# Patient Record
Sex: Female | Born: 1935 | Race: White | Hispanic: No | Smoking: Never smoker
Health system: Southern US, Community
[De-identification: ages and names within clinical notes are randomized; demographics above are authoritative.]

## PROBLEM LIST (undated history)

## (undated) DIAGNOSIS — N904 Leukoplakia of vulva: Secondary | ICD-10-CM

## (undated) DIAGNOSIS — C9590 Leukemia, unspecified not having achieved remission: Secondary | ICD-10-CM

## (undated) DIAGNOSIS — K219 Gastro-esophageal reflux disease without esophagitis: Secondary | ICD-10-CM

## (undated) DIAGNOSIS — E785 Hyperlipidemia, unspecified: Secondary | ICD-10-CM

## (undated) DIAGNOSIS — Z9289 Personal history of other medical treatment: Secondary | ICD-10-CM

## (undated) DIAGNOSIS — I629 Nontraumatic intracranial hemorrhage, unspecified: Secondary | ICD-10-CM

## (undated) DIAGNOSIS — I10 Essential (primary) hypertension: Secondary | ICD-10-CM

## (undated) DIAGNOSIS — E079 Disorder of thyroid, unspecified: Secondary | ICD-10-CM

## (undated) DIAGNOSIS — K279 Peptic ulcer, site unspecified, unspecified as acute or chronic, without hemorrhage or perforation: Secondary | ICD-10-CM

## (undated) DIAGNOSIS — Z950 Presence of cardiac pacemaker: Secondary | ICD-10-CM

## (undated) HISTORY — DX: Leukoplakia of vulva: N90.4

## (undated) HISTORY — PX: TONSILLECTOMY AND ADENOIDECTOMY: SUR1326

## (undated) HISTORY — DX: Essential (primary) hypertension: I10

## (undated) HISTORY — DX: Presence of cardiac pacemaker: Z95.0

## (undated) HISTORY — DX: Gastro-esophageal reflux disease without esophagitis: K21.9

## (undated) HISTORY — DX: Hyperlipidemia, unspecified: E78.5

## (undated) HISTORY — DX: Peptic ulcer, site unspecified, unspecified as acute or chronic, without hemorrhage or perforation: K27.9

## (undated) HISTORY — DX: Disorder of thyroid, unspecified: E07.9

## (undated) HISTORY — PX: DILATION AND CURETTAGE OF UTERUS: SHX78

## (undated) HISTORY — PX: COLONOSCOPY: SHX174

## (undated) HISTORY — DX: Nontraumatic intracranial hemorrhage, unspecified: I62.9

## (undated) HISTORY — DX: Personal history of other medical treatment: Z92.89

## (undated) HISTORY — PX: CATARACT EXTRACTION W/ INTRAOCULAR LENS  IMPLANT, BILATERAL: SHX1307

---

## 1998-09-08 ENCOUNTER — Other Ambulatory Visit: Admission: RE | Admit: 1998-09-08 | Discharge: 1998-09-08 | Payer: Self-pay | Admitting: Internal Medicine

## 1999-09-20 ENCOUNTER — Encounter: Admission: RE | Admit: 1999-09-20 | Discharge: 1999-09-20 | Payer: Self-pay | Admitting: Internal Medicine

## 1999-09-20 ENCOUNTER — Encounter: Payer: Self-pay | Admitting: Internal Medicine

## 2000-01-05 ENCOUNTER — Other Ambulatory Visit: Admission: RE | Admit: 2000-01-05 | Discharge: 2000-01-05 | Payer: Self-pay | Admitting: Internal Medicine

## 2000-02-08 ENCOUNTER — Ambulatory Visit (HOSPITAL_COMMUNITY): Admission: RE | Admit: 2000-02-08 | Discharge: 2000-02-08 | Payer: Self-pay | Admitting: *Deleted

## 2000-02-08 ENCOUNTER — Encounter (INDEPENDENT_AMBULATORY_CARE_PROVIDER_SITE_OTHER): Payer: Self-pay | Admitting: Specialist

## 2000-12-06 ENCOUNTER — Encounter: Admission: RE | Admit: 2000-12-06 | Discharge: 2000-12-06 | Payer: Self-pay | Admitting: Internal Medicine

## 2000-12-06 ENCOUNTER — Encounter: Payer: Self-pay | Admitting: Internal Medicine

## 2001-01-10 ENCOUNTER — Other Ambulatory Visit: Admission: RE | Admit: 2001-01-10 | Discharge: 2001-01-10 | Payer: Self-pay | Admitting: Internal Medicine

## 2002-01-22 ENCOUNTER — Encounter: Admission: RE | Admit: 2002-01-22 | Discharge: 2002-01-22 | Payer: Self-pay | Admitting: Internal Medicine

## 2002-01-22 ENCOUNTER — Encounter: Payer: Self-pay | Admitting: Internal Medicine

## 2002-04-27 ENCOUNTER — Other Ambulatory Visit: Admission: RE | Admit: 2002-04-27 | Discharge: 2002-04-27 | Payer: Self-pay | Admitting: Internal Medicine

## 2003-02-05 ENCOUNTER — Encounter: Admission: RE | Admit: 2003-02-05 | Discharge: 2003-02-05 | Payer: Self-pay | Admitting: Internal Medicine

## 2003-02-05 ENCOUNTER — Encounter: Payer: Self-pay | Admitting: Internal Medicine

## 2003-07-02 ENCOUNTER — Inpatient Hospital Stay (HOSPITAL_COMMUNITY): Admission: EM | Admit: 2003-07-02 | Discharge: 2003-07-14 | Payer: Self-pay | Admitting: Emergency Medicine

## 2003-07-03 ENCOUNTER — Encounter (INDEPENDENT_AMBULATORY_CARE_PROVIDER_SITE_OTHER): Payer: Self-pay | Admitting: Specialist

## 2003-07-14 ENCOUNTER — Inpatient Hospital Stay (HOSPITAL_COMMUNITY)
Admission: RE | Admit: 2003-07-14 | Discharge: 2003-07-28 | Payer: Self-pay | Admitting: Physical Medicine & Rehabilitation

## 2003-07-24 HISTORY — PX: SUBDURAL HEMATOMA EVACUATION VIA CRANIOTOMY: SUR319

## 2003-08-18 ENCOUNTER — Encounter: Admission: RE | Admit: 2003-08-18 | Discharge: 2003-08-18 | Payer: Self-pay | Admitting: Geriatric Medicine

## 2003-09-03 ENCOUNTER — Encounter
Admission: RE | Admit: 2003-09-03 | Discharge: 2003-12-02 | Payer: Self-pay | Admitting: Physical Medicine & Rehabilitation

## 2004-03-01 ENCOUNTER — Encounter: Admission: RE | Admit: 2004-03-01 | Discharge: 2004-03-01 | Payer: Self-pay | Admitting: Internal Medicine

## 2004-07-20 ENCOUNTER — Inpatient Hospital Stay (HOSPITAL_COMMUNITY): Admission: EM | Admit: 2004-07-20 | Discharge: 2004-07-22 | Payer: Self-pay | Admitting: Emergency Medicine

## 2004-07-21 HISTORY — PX: PACEMAKER INSERTION: SHX728

## 2005-03-09 ENCOUNTER — Encounter: Admission: RE | Admit: 2005-03-09 | Discharge: 2005-03-09 | Payer: Self-pay | Admitting: Internal Medicine

## 2005-04-17 ENCOUNTER — Ambulatory Visit (HOSPITAL_COMMUNITY): Admission: RE | Admit: 2005-04-17 | Discharge: 2005-04-17 | Payer: Self-pay | Admitting: *Deleted

## 2005-04-17 ENCOUNTER — Encounter (INDEPENDENT_AMBULATORY_CARE_PROVIDER_SITE_OTHER): Payer: Self-pay | Admitting: *Deleted

## 2005-08-13 ENCOUNTER — Ambulatory Visit (HOSPITAL_COMMUNITY): Admission: RE | Admit: 2005-08-13 | Discharge: 2005-08-13 | Payer: Self-pay | Admitting: *Deleted

## 2006-03-19 ENCOUNTER — Encounter: Admission: RE | Admit: 2006-03-19 | Discharge: 2006-03-19 | Payer: Self-pay | Admitting: Internal Medicine

## 2007-03-27 ENCOUNTER — Encounter: Admission: RE | Admit: 2007-03-27 | Discharge: 2007-03-27 | Payer: Self-pay | Admitting: Internal Medicine

## 2008-03-31 ENCOUNTER — Encounter: Admission: RE | Admit: 2008-03-31 | Discharge: 2008-03-31 | Payer: Self-pay | Admitting: Internal Medicine

## 2009-04-01 ENCOUNTER — Encounter: Admission: RE | Admit: 2009-04-01 | Discharge: 2009-04-01 | Payer: Self-pay | Admitting: Internal Medicine

## 2009-10-21 DIAGNOSIS — Z9289 Personal history of other medical treatment: Secondary | ICD-10-CM

## 2009-10-21 HISTORY — DX: Personal history of other medical treatment: Z92.89

## 2009-10-21 HISTORY — PX: TRANSTHORACIC ECHOCARDIOGRAM: SHX275

## 2010-04-18 ENCOUNTER — Encounter: Admission: RE | Admit: 2010-04-18 | Discharge: 2010-04-18 | Payer: Self-pay | Admitting: Internal Medicine

## 2010-12-07 ENCOUNTER — Ambulatory Visit (AMBULATORY_SURGERY_CENTER): Payer: Medicare Other | Admitting: *Deleted

## 2010-12-07 ENCOUNTER — Encounter: Payer: Self-pay | Admitting: Gastroenterology

## 2010-12-07 VITALS — Ht 65.0 in | Wt 153.0 lb

## 2010-12-07 DIAGNOSIS — Z800 Family history of malignant neoplasm of digestive organs: Secondary | ICD-10-CM

## 2010-12-07 MED ORDER — PEG-KCL-NACL-NASULF-NA ASC-C 100 G PO SOLR: ORAL | Status: DC

## 2010-12-08 NOTE — Procedures (Signed)
[place]. [facility]  Patient:    [name], [name]                       MRN: [id number] Adm. Date:  [id number] Disc. Date: [id number] Attending:  Sabino Gasser                           Procedure Report  OPERATIVE PROCEDURE:  Upper endoscopy with biopsy.  INDICATIONS:  Heme positive stools.  Iron deficiency anemia.  ANESTHESIA:  Demerol 75 mg, Versed 7 mg given intravenously in divided dose.  PROCEDURE IN DETAIL:  With the patient mildly sedated in the left lateral decubitus position, the Olympus videoscopic endoscope was inserted into the mouth and passed under direct visualization through the esophagus.  The distal esophagus was approached and showed areas questionable of short segment Barretts.  This was photographed.  We entered into the stomach.  The  fundus, body, and antrum were well visualized and appeared normal although the tissue appeared somewhat adenomatous.  We advanced through the pylorus into the duodenal bulb and second portion of the duodenum- both of which appeared normal.  I photographed from this point.  The endoscope was slowly withdrawn to visualize the entire duodenal mucosa until the endoscope was pulled back in the stomach and placed in retroflexion to view the stomach from below.  This too appeared normal and was photographed.  Biopsies of the fundus were taken. The endoscope was then straightened and pulled back from distal to proximal for views of the entire gastric and remaining esophageal mucosa which otherwise appeared normal.  The patients vital signs and pulse oximeter remained stable.  The patient tolerated the procedure well without apparent complications.  FINDINGS:  Question of adenomatous type changes to the fundus of the stomach, biopsied, along with question of short segment Barretts esophagus distally, biopsied.  Await biopsy report.  The patient will call me for results and followup with me as an  outpatient.  Proceed to colonoscopy. DD:  02/08/00 TD:  02/09/00 Job: [id number]

## 2010-12-08 NOTE — Op Note (Signed)
NAMELEASHA, GOLDBERGER                ACCOUNT NO.:  [id number]   MEDICAL RECORD NO.:  [id number]          PATIENT TYPE:  AMB   LOCATION:  ENDO                         FACILITY:  [facility]   PHYSICIAN:  Georgiana Spinner, M.D.    DATE OF BIRTH:  03/03/1929   DATE OF PROCEDURE:  08/13/2005  DATE OF DISCHARGE:                                 OPERATIVE REPORT   PROCEDURE:  Upper endoscopy.   INDICATIONS:  Bleeding ulcers.   ANESTHESIA:  Demerol 40, Versed 4 mg.   DESCRIPTION OF PROCEDURE:  With the patient mildly sedated in the left  lateral decubitus position, the Olympus videoscopic endoscope was inserted  in the mouth, passed under direct vision through the esophagus which  appeared normal into the stomach. The fundus, body, antrum, duodenal bulb,  and second portion of duodenum were visualized and all appeared normal. From  this point, the endoscope was slowly withdrawn taking circumferential views  of the duodenal mucosa until the endoscope had been pulled back into the  stomach, placed in retroflexion to view the stomach from below. The  endoscope was straightened and withdrawn taking circumferential views of the  remaining gastric and esophageal mucosa. The patient's vital signs and pulse  oximeter remained stable. The patient tolerated the procedure well and  without apparent complications.   FINDINGS:  Unremarkable examination. Plan the patient can continue aspirin  with Prilosec at this point and follow-up will be with me only on as-needed  basis.           ______________________________  Georgiana Spinner, M.D.     GMO/MEDQ  D:  08/13/2005  T:  08/13/2005  Job:  [id number]

## 2010-12-08 NOTE — Group Therapy Note (Signed)
[name] is here status post left frontotemporal hemorrhagic contusion and  subarachnoid hemorrhage.  The patient was on the rehabilitation unit July 28, 2003, and then she was discharged to home at the [facility] with  intermittent supervision.  She has completed her outpatient physical therapy  at this point.  She has done some short distance driving after clearance by  neurosurgery.  The patient denies any headaches, nausea, vomiting, and any  problems with balance or vertigo.  No weakness.  She has been sleeping  fairly well.  Appetite has been good.  She denies any agitation or  depression.  No pain complaints are noted today either.   REVIEW OF SYSTEMS:  Pertinent positives listed above.  The patient also  notes reflux and heartburn.   PHYSICAL EXAMINATION:  On physical examination today, the patient is  pleasant and in no acute distress.  The blood pressure is 94/67, the pulse  is 79, the and respiratory rate is 16.  She is saturating 99% on room air.  Gait is normal.  She is alert and appropriate.  Cranial nerve exam II-XII  was grossly intact.  Motor and sensory exams were normal today at 5/5 and  2/2, respectively.  Reflexes are 2+.  The patient had fair fine motor  coordination of the upper extremities.  She did have some problems with heel  to toe ambulation to a minimal extent, but overall her basic balance was  good.  Cognitively she was appropriate with good organization skills and  memory.  She asked appropriate questions today in the office.  The skin was  intact.  The pulses were 2+.  The heart was regular rate and rhythm.  Lungs  were clear.   ASSESSMENT:  1. Status post traumatic brain injury.  2. Gait disorder.   PLAN:  1. The patient has done very well to this point.  I give her clearance to     drive and pursue any type of activity to her tolerance level.  2. I will see her back here on a p.r.n. basis.  No further followup is     recommended here.     Ranelle Oyster, M.D.   ZTS/MedQ  D:  09/06/2003 12:59:20  T:  09/06/2003 13:35:41  Job #:  [id number]

## 2010-12-08 NOTE — Op Note (Signed)
NAMEMEGANN, EASTERWOOD                ACCOUNT NO.:  [id number]   MEDICAL RECORD NO.:  [id number]          PATIENT TYPE:  AMB   LOCATION:  ENDO                         FACILITY:  [facility]   PHYSICIAN:  Georgiana Spinner, M.D.    DATE OF BIRTH:  May 21, 1929   DATE OF PROCEDURE:  04/17/2005  DATE OF DISCHARGE:                                 OPERATIVE REPORT   PROCEDURE:  Upper endoscopy.   ENDOSCOPIST:  Georgiana Spinner, M.D.   INDICATIONS:  Gastroesophageal reflux disease.   ANESTHESIA:  Demerol 40, Versed 4 milligrams.   PROCEDURE IN DETAIL:  With the patient mildly sedated in the left lateral  decubitus position, the Olympus videoscopic endoscope was inserted into the  mouth and passed under direct vision through the esophagus which appeared  normal. I did not clearly see Barrett's esophagus but biopsied around the  perimeter to evaluate for that and entered into the stomach and saw some  blood streaking. The fundus body appeared normal and the antrum mildly  erythematous. We entered into the duodenal bulb and there was some small  ulcerations seen, but as we proceeded further, we noted marked ulceration of  the duodenal mucosa. This was photographed and biopsies were taken. The  endoscope was then withdrawn taking circumferential views of the duodenal  mucosa until the endoscope had been pulled back into the stomach, placed in  retroflexion to view the stomach from below. Hiatal hernia was noted. The  endoscope was straightened and withdrawn taking circumferential views of the  remaining gastric and esophageal mucosa stopping to biopsy the antrum. The  patient's vital signs and pulse oximetry remained stable. The patient  tolerated procedure well without apparent complications.   FINDINGS:  Biopsies taken of the stomach to rule out H pylori and of  esophagus to rule out Barrett's esophagus. Marked ulceration of duodenum  also biopsied.   PLAN:  The patient is on an NSAID and will need to  stop those at least  temporarily. If she is not on a PPI, she will need to be on one and  depending on results of biopsy, she may need treatment for H pylori.  Proceed to colonoscopy as planned.           ______________________________  Georgiana Spinner, M.D.     GMO/MEDQ  D:  04/17/2005  T:  04/17/2005  Job:  [id number]

## 2010-12-08 NOTE — Procedures (Signed)
[place]. [facility]  Patient:    [name], [name]                       MRN: [id number] Proc. Date: 02/08/00 Adm. Date:  [id number] Disc. Date: [id number] Attending:  Sabino Gasser                           Procedure Report  PROCEDURE:  Colonoscopy.  INDICATIONS:  Hemoccult positivity.  ANESTHESIA:  See endoscopy note.  DESCRIPTION OF PROCEDURE:  With the patient mildly sedated in the left lateral decubitus position, the Olympus videoscopic colonoscope was inserted in the rectum and passed under direct vision to the cecum.  The cecum was identified by ileocecal valve and cecal cap, both of which were photographed. We entered into the terminal ileum which appeared normal and then from this point the endoscope was slowly withdrawn, taking circumferential views of the entire colonic mucosa visualized, until we pulled back to the rectum which appeared normal in direct view and showed large internal hemorrhoids on retroflexed view.  The endoscope was straightened and withdrawn.  The patients vital signs and pulse oximeter remained stable.  The patient tolerated the procedure well without apparent complications.  FINDINGS:  Internal hemorrhoids, otherwise, unremarkable colonoscopic examination, including terminal ileum.  PLAN:  See endoscopy note.  Patient will call me for results of biopsies of the upper GI tract and follow up with me as an outpatient. DD:  02/08/00 TD:  02/09/00 Job: [id number]

## 2010-12-08 NOTE — Consult Note (Signed)
NAMECARLOS, [name]                ACCOUNT NO.:  [id number]   MEDICAL RECORD NO.:  [id number]          PATIENT TYPE:  INP   LOCATION:  2922                         FACILITY:  [facility]   PHYSICIAN:  Janeece Riggers. Severiano Gilbert, M.D.    DATE OF BIRTH:  [birth year]   DATE OF CONSULTATION:  07/20/2004  DATE OF DISCHARGE:                                   CONSULTATION   REASON FOR CONSULTATION:  Complete heart block, need for pacemaker  implantation.   A 77 year old white female who presented to her primary doctor, Dr.  Lendell Caprice, today, with about a 21-week history of fatigue, breathlessness  with activity, and reduction in her exercise tolerance. She was seen in the  clinic. General physical exam was pretty unremarkable although it was noted  that she was bradycardic. Electrocardiogram demonstrated underlying sinus  rhythm with high degree AV block, and patient was referred to the emergency  room. She was seen in the ED by Dr. Elsie Lincoln who placed an external pacemaker  for emergency pacing should it be necessary; the patient was hemodynamically  stable with heart rates in the 30s, and she was admitted to the telemetry  area of the hospital for monitoring. Aside from a one week history of  fatigue, symptomatic bradycardia, she is actually a very active elderly  woman with very few physical limitations.   PAST MEDICAL HISTORY:  1.  Subdural hematoma approximately 15 months ago.  2.  Cataracts and glaucoma.  3.  Osteoporosis.  4.  History of hypothyroidism.   SOCIAL HISTORY:  She is a widow. She does not drink or smoke. She is good  overall health.   FAMILY HISTORY:  Family history of coronary artery disease.   ALLERGIES:  No known although possible sulfa allergy.   MEDICATIONS:  1.  Aspirin 81 mg p.o. q.d.  2.  Protonix.  3.  Timoptic drops.  4.  Multivitamin.  5.  Ambien p.r.n. sleep.  6.  Xanax p.r.n. anxiety.   REVIEW OF SYSTEMS:  No weight loss, weight gain, fevers, sweats, or chills.  Minor arthritic complaints. No areas of ulceration or breakdown.  RESPIRATORY:  Negative. CARDIOVASCULAR:  See HPI. No syncope.  GASTROINTESTINAL:  Negative. GENITOURINARY:  No dysuria or burning.  ENDOCRINE:  She has a history of hypothyroidism but no active  hypothyroidism. NEUROLOGICAL:  Negative. HEENT:  Negative. PSYCH:  She is  well adjusted. No mania. No depression.   PHYSICAL EXAMINATION:  GENERAL:  On exam, she was seen at the bedside in the  ED, awaiting telemetry bed availability. Well-developed, well-nourished,  white female in no acute distress.  VITAL SIGNS:  Her blood pressure was 120/60, heart rate 35 and regular,  respiratory rate 16, saturating 90% on room air, afebrile.  HEENT:  Within normal limits, atraumatic and normocephalic. Midline nasal  septum. Moist mucous membranes.  NECK:  Supple. No bruits. No JVP. No thyromegaly. No adenopathy.  CHEST:  Normal female.  BACK:  Negative CVAT.  RESPIRATORY:  Clear to auscultation.  CARDIOVASCULAR:  Regular rate and rhythm, brady. Normal S1 and S2, positive  S4, no S3,  no gallops, no murmurs, no thrills.  ABDOMEN:  Soft, nontender, positive bowel sounds, no hepatosplenomegaly, no  bruits or masses.  GENITOURINARY:  Deferred.  EXTREMITIES:  No clubbing, cyanosis, or edema. Peripheral pulses 2+ and  equal.  NEUROLOGICAL:  Alert and oriented x4. Nonfocal motor and sensory exam.  Cranial nerves II-XII were intact.   STUDIES:  Electrocardiogram:  I reviewed a series of electrocardiograms  which all demonstrated a narrow complex normal QRS with normal QT interval.  She has a variety of levels of block from third degree to second degree  Mobitz 2 block. She also has periods of 1 to 1 conduction.   LABORATORY DATA:  White 6,300, platelet count 226,000, hematocrit 38%.  Potassium 4.2, creatinine 0.8, fasting sugar 89. Coags:  INR 1. She has mild  transaminitis. ALT is 82. Her troponin was negative. TSH result is currently   pending.   ASSESSMENT:  Complete heart block. Patient with high degree transient  complete heart block appearing with Mobitz 2 block with narrow complex  escape. The patient is hemodynamically stable. These symptoms have persisted  for one week. She meets Class I indication for pacemaker implantation for  symptomatic bradycardia in the setting of complete heart block. Her  junctional escape is less than 40 beats per minute. She really has no other  comorbidities and no indications for reversible causes of complete heart  block. Discussed at length the risk and benefits of complete heart block  with the patient, and the patient elects to proceed.   PLAN:  1.  NPO overnight.  2.  Usual preoperative antibiotics.  3.  Implantation of dual chamber pacemaker in the morning.      Mark   MEP/MEDQ  D:  07/21/2004  T:  07/21/2004  Job:  [id number]

## 2010-12-08 NOTE — Op Note (Signed)
[name], [name]                          ACCOUNT NO.:  [id number]   MEDICAL RECORD NO.:  [id number]                   PATIENT TYPE:  INP   LOCATION:  2309                                 FACILITY:  [facility]   PHYSICIAN:  Danae Orleans. Venetia Maxon, M.D.               DATE OF BIRTH:  23-Mar-1929   DATE OF PROCEDURE:  07/03/2003  DATE OF DISCHARGE:                                 OPERATIVE REPORT   PREOPERATIVE DIAGNOSIS:  Left frontal intracranial hemorrhage.   POSTOPERATIVE DIAGNOSIS:  Left frontal intracranial hemorrhage.   PROCEDURE:  Left frontal craniotomy with evacuation of intracerebral  hemorrhage.   SURGEON:  Danae Orleans. Venetia Maxon, M.D.   ANESTHESIA:  General endotracheal anesthesia.   ESTIMATED BLOOD LOSS:  600 mL.   COMPLICATIONS:  None.   DISPOSITION:  Recovery then 2300.   INDICATIONS FOR PROCEDURE:  [name] is a 77 year old woman who fell and  hit her head. She had a subarachnoid hemorrhage and left frontal contusion  on July 02, 2003. She had a slight coagulopathy on the 11th. A repeat  head CT scan demonstrated enlargement and coalescence of a contusion with  significant left frontal brain  compression and sub falcine herniation. The  patient was obtunded and had a right hemiparesis. It was elected to take her  to surgery.   DESCRIPTION OF PROCEDURE:  [name] was brought to the operating room.  Following satisfactory and uncomplicated induction of general endotracheal  anesthesia and placement of intravenous lines, the patient was placed in the  supine position on the operating table. The left side was bumped up on a  blanket roll. She is placed in 3-pin head fixation. Her left frontal scalp  was prepped and draped in the usual sterile fashion. The area of planned  incision was infiltrated with 0.25% Marcaine, 0.5% Lidocaine and 1:200,000  epinephrine.   A pterional craniotomy incision was made and carried through the temporalis  fascia. The temporalis muscle  was brought forward with the scalp flap. The  Anspach drill and acorn bit were then utilized to place 3 bur holes and a B1  bur and foot plate equivalent were then used to turn the bone flap. The bone  flap was elevated, revealing a large amount of left frontal blood and  contused cortex with some old hematoma overlying it. The dura was opened  further and the hematoma cavity was then entered and evacuated.   There was clotted blood and a considerable amount of blood was removed. The  brain  surrounding  was fairly friable. Hemostasis was assured with cotton  balls and cotton strips and the wound was irrigated copiously with saline.  There was some residual  bleeding which was stopped with bipolar  electrocautery. The peeled surfaces were then cauterized as well.   After hemostasis was assured, the surgical cavity was lined with Surgicel.  The dura was then closed loosely and a  piece of Gelfoam was placed overlying  the dural defect after dural tackup stitches were placed along the edges and  hemostasis was assured. Using the rapid flap spin down bur hole covers,  three 16-mm plates were placed, and the bone flap was replaced and secured  into position.   The temporalis fascia was closed with 2-0 Vicryl sutures. The galea was  reapproximated with 2-0 Vicryl interrupted inverted sutures. The skin edges  were reapproximated with running 3-0 nylon locked stitch. The wound was  dressed with Bacitracin, Telfa gauze and Kerlix head wrap.   The patient was taken out of 3-pin fixation and remained intubated. She was  taken to the surgical intensive care unit in stable condition, having  tolerated  the procedure well.                                               Danae Orleans. Venetia Maxon, M.D.    JDS/MEDQ  D:  07/03/2003  T:  07/03/2003  Job:  [id number]

## 2010-12-08 NOTE — Consult Note (Signed)
NAME:  [name], [name]                          ACCOUNT NO.:  [id number]   MEDICAL RECORD NO.:  [id number]                   PATIENT TYPE:  IPS   LOCATION:  4001                                 FACILITY:  [facility]   PHYSICIAN:  Hermelinda Medicus, M.D.                DATE OF BIRTH:  03/17/1929   DATE OF CONSULTATION:  DATE OF DISCHARGE:                                   CONSULTATION   OTORHINOLARYNGOLOGY CONSULTATION   HISTORY OF PRESENT ILLNESS:  This patient has been admitted to the trauma  service with a history of syncope event, fell striking her head, apparently  fell on the floor at the [facility] and was brought to the emergency room.  She is recovering as a head trauma patient where she underwent neurosurgery  to relieve a subdural hematoma.  She has been intubated and now is extubated  and on July 09, 2003 was noted when she was being scoped to have some  grayish material, apparently lumpy material, underneath the cords and some  edema of the laryngeal vestibule.  I was called yesterday for evaluation.  Her dysphasia appears to be improving though she will be going to  rehabilitation and her voice on initial evaluation is somewhat weak yet she  does not appear to have a situation where one vocal cord or both cords are  so weak that she would be a high aspiration candidate.  Her voice,  otherwise, is quite excellent.  No sound of irregular lesion on the vocal  cord.   PHYSICAL EXAMINATION:  HEENT:  On evaluation the ears are clear.  Tympanic  membranes are clear.  Nasal shows a septal deviation but is clear otherwise.  Oral is clear, no ulceration or mass.  LARYNX:  Examination of the larynx shows some mild edema in the false cord  region.  True vocal cords look quite symmetrical and move, though weakly,  symmetrically and a glimpse inferior to the cords appears to show at most a  slight edema typical of someone who has been intubated but no evidence of  any malignancy in the  subglottic, glottic or supraglottic regions.  Lateral  pharyngeal walls, epiglottis, base of tongue, posterior pharynx all appear  to be clear.  The vocal cords also appear to be in good condition.  NECK:  Free of any thyromegaly, cervical adenopathy or mass.  NEUROLOGICAL/ MUSCULOSKELETAL:  The patient obviously has had neurosurgery  and is quite moderately alert, moderately oriented and has the incisional  site on the left frontal region.   INITIAL DIAGNOSIS:  Possible neurologic change secondary to head trauma and  surgery for subdural hematoma but no evidence of any malignancy of the  larynx or subglottic regions.  Hermelinda Medicus, M.D.    JC/MEDQ  D:  07/14/2003  T:  07/15/2003  Job:  [id number]   cc:   Gabrielle Dare. Janee Morn, M.D.  [facility]  [address] [place], [place] [address]  Fax: [phone]

## 2010-12-08 NOTE — Op Note (Signed)
NAMEAMEIRA, ALESSANDRINI                          ACCOUNT NO.:  [id number]   MEDICAL RECORD NO.:  [id number]                   PATIENT TYPE:  INP   LOCATION:  3113                                 FACILITY:  [facility]   PHYSICIAN:  Gabrielle Dare. Janee Morn, M.D.             DATE OF BIRTH:  04/28/1929   DATE OF PROCEDURE:  07/06/2003  DATE OF DISCHARGE:                                 OPERATIVE REPORT   PREPROCEDURE DIAGNOSES:  1. Traumatic brain injury with subdural hematoma, status post craniotomy.  2. Left-sided pneumonia.  3. Hypotension.  4. Need for central venous pressure monitoring and central access.   POSTPROCEDURE DIAGNOSES:  1. Traumatic brain injury with subdural hematoma, status post craniotomy.  2. Left-sided pneumonia.  3. Hypotension.  4. Need for central venous pressure monitoring and central access.   PROCEDURE:  Insertion of left subclavian vein triple-lumen catheter.   SURGEON:  Gabrielle Dare. Janee Morn, M.D.   HISTORY OF PRESENT ILLNESS:  The patient is a 77 year old white female who  was admitted status post fall on July 02, 2003.  She suffered a left  frontal subdural hematoma.  She subsequently worsened neurologically and  required craniotomy by neurosurgery.  Today she remains intubated.  She  developed some decrease in saturation and hypotension.  Chest x-ray  demonstrated left-sided pneumonia, and we will place central line for CVP  monitoring and access.   PROCEDURE IN DETAIL:  Informed consent was obtained from the patient's  daughter.  Her left upper chest and neck were prepped and draped in a  sterile fashion.  The left subclavian vein was entered with good blood  return and using Seldinger technique.  The triple-lumen catheter was  inserted to approximately 16 cm. Good blood return was obtained from all  three ports.  It was sutured in place.  Lidocaine 1% had been used for local  anesthesia, and we will check a chest x-ray.  The patient tolerated the  procedure  well.                                               Gabrielle Dare Janee Morn, M.D.    BET/MEDQ  D:  07/06/2003  T:  07/07/2003  Job:  [id number]

## 2010-12-08 NOTE — Cardiovascular Report (Signed)
[name], [name]                ACCOUNT NO.:  [id number]   MEDICAL RECORD NO.:  [id number]          PATIENT TYPE:  INP   LOCATION:  2922                         FACILITY:  [facility]   PHYSICIAN:  Janeece Riggers. Severiano Gilbert, M.D.    DATE OF BIRTH:  1929-03-29   DATE OF PROCEDURE:  07/21/2004  DATE OF DISCHARGE:                              CARDIAC CATHETERIZATION   PROCEDURES PERFORMED:  1.  Dual chamber pacemaker implantation.  2.  Left venography.  3.  Fluoroscopic examination for implantation.   PREPROCEDURE DIAGNOSIS:  Symptomatic bradycardia with complete R block and  junctional escape less than 40.   POSTPROCEDURE DIAGNOSIS:  Symptomatic bradycardia with complete R block and  junctional escape less than 40.   OPERATOR:  Launa Grill, M.D.   COMPLICATIONS:  None.   ESTIMATED BLOOD LOSS:  Less than 50 mL.   PROCEDURE IN DETAIL:  Patient was brought to the cardiac catheterization  laboratory in a fasting state.  Patient's left pectoral region was prepped  and draped in the usual manner.  Ancef 1 g infused by IV for antimicrobial  prophylaxis immediately prior to skin incision.  Conscious sedation obtained  with intermittent injection of midazolam and fentanyl.  Local anesthesia  obtained by subcutaneous injection of 1% lidocaine.  A 3 cm incision was  made 1 cm inferior to the left clavicle and reaching the deltopectoral  groove.  Incision was developed to the level of the prepectoral muscle  fascia by blunt dissection and a pacemaker pocket of appropriate size was  formed inferior along the prepectoral fascia.  Hemostasis was ensured by  inspection and cautery.  The wound was copiously irrigated with antibiotics.  Using a thin wall needle technique two 7-French sheaths were introduced into  the left axillary vein after performance of the left axillary venogram.  A  Guidant model #4469 active fixation atrial pacing lead was advanced  fluoroscopically into the right atrium positioned  within the right atrial  appendage and after determining adequate electronic characteristics was  sutured in place using 0 silk ligatures.  Of course, the safe sheath had  been removed prior to suturing.  Next, a Guidant model #4480 ventricular  passive fixation lead was advanced under fluoroscopic guidance into the  right ventricular apex without difficulty.  The safe sheath was removed.  The lead was sutured in place with 0 silk ligatures after adequate  electronic characteristics were demonstrated.  The A and V leads were then  connected to a Guidant model #1296 dual chamber pacemaker which was then  placed within the pacemaker pocket.  The wound was closed in layers of 2-0  and 4-0 Vicryl.  The skin incision was reinforced with Steri-Strips and a  bulky pressure dressing applied.  Patient tolerated procedure well.  Was  returned to the holding area in stable condition.   The electronic characteristics of the atrial lead demonstrated a capture  threshold of less than 1 volt at 0.5 milliseconds, measured P-wave  approximately 3 millivolts, impedance 404 ohms.  The measured RV R-wave was  10 millivolts.  Threshold was approximately 0.5 volts  and 0.5 milliseconds.  The impedance was 464 ohms.  There  was no diaphragmatic pacing noted.  The device was programmed as a dual  chamber pacemaker without rate response, lower rate 60 beats per minute,  upper rate 120.  The A and V outputs were 5 volts at 0.5 milliseconds.  Sensitivity in A was 0.75 and the ventricle was 0.25 millivolts.      Mark   MEP/MEDQ  D:  07/21/2004  T:  07/21/2004  Job:  [id number]

## 2010-12-08 NOTE — Discharge Summary (Signed)
NAMELOVINA, ZUVER                ACCOUNT NO.:  [id number]   MEDICAL RECORD NO.:  [id number]           PATIENT TYPE:   LOCATION:                                 FACILITY:   PHYSICIAN:  Abelino Derrick, P.A.        DATE OF BIRTH:   DATE OF ADMISSION:  07/20/2004  DATE OF DISCHARGE:  07/22/2004                                 DISCHARGE SUMMARY   HISTORY:  Ms. Hetzer is a 77 year old female followed by Dr. Lendell Caprice who  saw him recently with complaints of fatigue.  EKG in Dr. Pincus Sanes office  showed third degree heart block with a rate of 35.  She was sent to  Completely negative ER and seen by Dr. Elsie Lincoln initially.  Backup external  pacer was placed.   PAST MEDICAL HISTORY:  Her past medical history is otherwise remarkable for  subdural hematoma with evacuation from a fall 06/2003.  She has treated  hypothyroidism.   ALLERGIES:   HOSPITAL COURSE:  She has a history of sulfa allergy.  She is admitted to  the CCU and set up for a pacemaker implant which was done 07/21/04 by Dr.  Launa Grill.  She had a Guidant BVD pacer implanted without complications.  We feel she can go home 07/22/04.  A follow-up chest x-ray showed no  pneumothorax.   DISCHARGE MEDICATIONS:  1.  Zantac 150 h.s.  2. Synthroid as taken at home.  3. Eye drops as taken      at home.   LABORATORY DATA:  White count 7.0, hemoglobin 13.9, hematocrit 4.0,  platelets 264,000.  CK-MB and troponins are negative.  Sodium 140, potassium  4.7, BUN 16, creatinine 0.7, and INR 1.0.  Chest x-ray on 07/21/04 showed a  vague density in the right mid upper lung zone which may have indicated a  nodule on infiltrate.  This was not seen on the followup PA and lateral  study on 07/22/04.  At discharge, she is AV paced.   DISPOSITION:  The patient is discharged in stable condition and will follow  up in the office with Lezlie Octave, FNP, for a wound check 07/27/04 at 3:30  p.m.  She will need a pacer check with Dr. Severiano Gilbert in about  three months.       LKK/MEDQ  D:  07/22/2004  T:  07/22/2004  Job:  [id number]   cc:   Madaline Savage, M.D.  [address], [address]  [place]  [place] [address]  Fax: [phone]   Janae Bridgeman. Eloise Harman., M.D.  [address]  [place]  [place] [address]  Fax: [phone]

## 2010-12-08 NOTE — Discharge Summary (Signed)
NAMEALEJANDRIA, WESSELLS                          ACCOUNT NO.:  [id number]   MEDICAL RECORD NO.:  [id number]                   PATIENT TYPE:  INP   LOCATION:  4001                                 FACILITY:  [facility]   PHYSICIAN:  Jimmye Norman, M.D.                   DATE OF BIRTH:  04/03/1929   DATE OF ADMISSION:  07/02/2003  DATE OF DISCHARGE:  07/14/2003                                 DISCHARGE SUMMARY   ADMITTING TRAUMA SURGEON:  Gabrielle Dare. Janee Morn, M.D.   CONSULTANTS:  Danae Orleans. Venetia Maxon, M.D., neurosurgery.  Sanjeev K. Corliss Skains,  M.D., interventional radiology.  Hermelinda Medicus, M.D., ENT.   DISCHARGE DIAGNOSES:  1. Status post fall with traumatic brain injury.  2. Subarachnoid and subdural hemorrhage as well as intracerebral contusion.  3. Ventilator-dependent respiratory failure, resolved.  4. Mild acute blood-loss anemia requiring transfusion of 2 units packed red     blood cells.  5. Dysphagia secondary to the above, improved at the time of discharge.  6. Seizure prophylaxis on Dilantin.  7. Pneumonia, resolved at the time of transfer to rehabilitation.   PROCEDURES:  Left frontal craniotomy with evacuation of intracerebral  hemorrhage in left frontal area, Dr. Venetia Maxon, July 03, 2003.   HISTORY:  This is a 77 year old white female who suffered a questionable  syncopal event and fell, striking her head on the floor in the cafeteria at  the [facility] home where she resides.  She was brought to the [facility]  emergency room with stable vital signs, heart rate of 82, blood pressure of  132/68, respirations of 20.  Oxygen saturation 98% on nasal cannula O2.  She  was unable to verbalize clearly and was uncooperative with the neurologic  exam.  Workup at the time, including a CT scan of the head, showed a large  area of subarachnoid hemorrhage and a large left frontal contusion as well  as subdural hematoma.  It was questioned if the patient may have had an  aneurysm.  She was  evaluated by Dr. Venetia Maxon.  She did undergo cerebral  angiogram per Dr. Corliss Skains, and this showed no angiographic evidence of any  aneurysm, arteriovenous malformation or dissection.  She did have a mild to  moderate left-to-right shift with a mass effect.  She was admitted to the  intensive care unit and became more somnolent.  A followup head CT scan  showed worsening with correlations of her hematoma in the left frontal lobe  and subfalcine herniation.  Secondary to this, the patient was taken to the  OR emergently for a left frontal craniotomy for hematoma evacuation.  She  remained on the ventilator postoperatively.  Her followup head CT scan was  improved.  She did require transfusion of 2 units packed red blood cells  secondary to moderate anemia in the postoperative course.  She was  maintained on Dilantin for seizure prophylaxis.  Followup  head CT on  July 04, 2003, showed decreased mass effect in the left frontal lobe,  but there was still some residual contusion hematoma.  Her scan was overall  improved, however.  The patient was continued on ventilator support.  She  appeared to be plegic on the right side.  She then developed what appeared  to be an early pneumonia and was started on Maxipime.  She gradually became  more alert, appropriate and was following commands.  It was felt that she  was appropriate for extubation, and she was extubated successfully on  July 08, 2003.  She had difficulty with dysphagia as well and underwent  evaluation by Dr. Haroldine Laws to rule out laryngeal pathology.  She underwent  laryngoscopy with no overt pathology seen.  It was felt that the patient's  continued dysphagia was related to her head trauma.  She did develop some  diarrhea and was treated empirically with Flagyl, but C. difficile was  negative x2.  She was making  gradual gains.  She was improving with regards to her dysphagia and was  started on mechanical soft diet with thin  liquids with close supervision for  this.  She continued to exhibit a right hemiparesis.  Secondary to all these  deficits, she was admitted to rehabilitation for a comprehensive inpatient  rehabilitation stage to address her ongoing deficits.      Shawn Rayburn, P.A.                       Jimmye Norman, M.D.    SR/MEDQ  D:  10/07/2003  T:  10/10/2003  Job:  [id number]

## 2010-12-08 NOTE — Discharge Summary (Signed)
[name], [name]                          ACCOUNT NO.:  [id number]   MEDICAL RECORD NO.:  [id number]                   PATIENT TYPE:  IPS   LOCATION:  4001                                 FACILITY:  [facility]   PHYSICIAN:  Ranelle Oyster, M.D.             DATE OF BIRTH:  August 11, 1928   DATE OF ADMISSION:  07/14/2003  DATE OF DISCHARGE:  07/28/2003                                 DISCHARGE SUMMARY   DISCHARGE DIAGNOSES:  1. Left frontal temporal hemorrhagic contusion.  2. Left subarachnoid hemorrhage and left subdural hematoma  3. Seizure prophylaxis.  4. History of glaucoma.  5. History of hypercholesterolemia.  6. Nausea, improved.  7. Hypokalemia, resolving.  8. History of thyroid disease.   HISTORY OF PRESENT ILLNESS:  The patient is a 77 year old white female who  fell at [facility] striking her head on July 02, 2003.  Questionable syncopal event.  Taken to the [facility] ER for  evaluation.  Head CT revealed left frontal and temporal hemorrhagic  contusion and left SAH and SDH.  The patient underwent a left craniotomy for  evacuation of hematoma by Dr. Venetia Maxon.  Cerebral arteriogram revealed no  intracranial aneurysm.  Hospital course was also significant for dysphagia  requiring Panda tube, discontinued, anemia status post transfusion, right  hemiparesis, and hypokalemia.  PT report at this time indicates the patient  is minimal assist with transfers and bed mobility minimal assist.  The  patient was transferred to [facility] Rehab Department for  comprehensive inpatient rehabilitation.   PRIMARY CARE PHYSICIAN:  Corinna L. Lendell Caprice, M.D.   PAST MEDICAL HISTORY:  Significant for depression, OS, and thyroid disease.   PAST SURGICAL HISTORY:  None.   MEDICATIONS:  1. Fosamax.  2. Alphagan.  3. Synthroid 550 mcg daily.  4. Xanax 30 mg daily.   SOCIAL HISTORY:  The patient lives next door to brother at [facility].  She was independent prior to admission.  She was  driving.  She has one child.  Denies any tobacco or alcohol use.  Family  able to assist.   REVIEW OF SYSTEMS:  Significant for reflux and denies any chest pain,  shortness of breath, or nausea and vomiting.   ALLERGIES:  No known drug allergies.   HOSPITAL COURSE:  The patient was admitted to [facility] Rehab  Department on July 14, 2003, for comprehensive inpatient rehabilitation  and received more than three hours of therapy daily.  Hospital course  significant for the following:   Problem 1.  Left frontal hemorrhagic contusion, left SAH and SDH.  Overall,  the patient progressed fairly well during her stay in rehab.  She had no  significant neurologic events.  Occasional headaches which were relieved by  Tylenol.  She remained on Nimotop for vasospasms.  She also was placed on  Dilantin for seizure prophylaxis.  The patient  had no significant changes in  neurologic state while in rehab.  Due to slight nausea and vomiting,  Dilantin was discontinued and Tegretol was started.  The patient will be  discharged home on Tegretol and needs to follow up with neurosurgeon in  about two weeks.  The patient is discharged at overall supervision level.  She is able to ambulate approximately 200 feet with rolling walker.  She  also ambulates 75 feet with no assistive device.  The patient's bed mobility  is modified independent and may transfer sit to stand with supervision.  She  is able to perform most ADL's with supervision level.  Basic cognition  improved significantly.  She is able to function fairly well as far as long  term memory within normal function of limits.  Verbal problem solvings have  been assist.  Verbal __________ at supervision level.  Basic comprehension  100% back to baseline.  She still has decreased attention to conversation.  From a neurologic standpoint and physical standpoint, she improved   significantly, but still required 24-hour supervision.   Problem 2.  GI.  The patient had significant nausea and vomiting and  decreased appetite while on rehab.  She initially was placed on D3 thin  liquid diet and this was advanced to a regular diet.  Adjustment of  medications was performed to improve nausea and p.o. intake.  On July 26, 2003, she initially was placed on Megace 40 mg p.o. b.i.d.  It was thought  the Nimotop was possibly causing the nausea and vomiting.  She was tapered  off the Nimotop and Dilantin was discontinued and Tegretol was started.  The  patient also was placed on Reglan 5 mg p.o. q.h.s.  After addition of Reglan  and discontinuation of Nimotop, the nausea and vomiting did subside.  The  patient still had significant decrease in p.o. intake.  She was encouraged  to eat more food and drink plenty of liquids. KUB was performed on July 23, 2003, to rule out ileus and it was negative.  The patient also received  as Compazine as well as Phenergan for nausea and vomiting.   Problem 3.  Hypokalemia.  On July 26, 2003, the patient had potassium  level of 3.1.  Therefore she was given K-Dur 20 mEq p.o. daily for a total  of three days.  Repeat potassium level is pending at this time.   Problem 4.  Anemia.  At time of admission, her hemoglobin was 10.2,  hematocrit was 30.3.  Repeat hemoglobin and hematocrit was performed on  December 31 and this revealed anemia had resolved.  Hemoglobin was 14.2 and  hematocrit of 42.5.   There were no other major issues that occurred while the patient was in  rehab.   LABORATORY DATA:  Latest potassium level performed on July 26, 2003, was  3.1, sodium 136, BUN 20, creatinine 0.4, glucose 104.  Compazine, Tegretol  3.0. Latest white blood cell count 9.7, hemoglobin 14.2, hematocrit 42.5,  and platelet count 568.  Latest Dilantin performed July 22, 2003, was  4.0.  At the time of discharge, all vital signs were  stable.  PT report indicated  that the patient was able to ambulate 200 feet with supervision with rolling  walker and ambulate 75 feet with no assistive device at minimal assist  level.  Bed mobility modified independent level.  May perform most ADL's at  supervision level.  From PT standpoint, the patient made good progress and  progressed with all goals.  Due to requiring supervision, the patient was  discharged to a skill nursing facility at [facility].  When the patient  has progressed physically and still continues with cognitive deficits to  have patient risks for falls secondary to decreased safety awareness.  Therefore, it was thought best the patient be discharged to a skilled  nursing facility at [facility].  Due to family unable to provide 24-hour  supervision and physical care that is needed.   DISCHARGE MEDICATIONS:  1. Xalatan one drop p.o. q.h.s.  2. Desyrel 100 mg p.o. q.h.s.  3. Nimotop 53 mg two tablets p.o. b.i.d. x3 days, one tablet p.o. b.i.d. x3,     one tablet p.o. daily x3 days, then stop.  4. Tegretol 200 mg p.o. b.i.d.  5. Tylenol 325 to 650 mg p.o. q.4-6h p.r.n.  6. Compazine 10 mg p.o. q.6h p.r.n.  7. The patient may resume her Xanax 300 mg p.o. daily.  8. The patient may resume her Synthroid 50 mcg p.o. daily.   Pain management will be Tylenol.   ACTIVITY:  No driving or drinking alcohol.  Use walker.  No smoking.  The  patient will have continued physical therapy, occupational therapy, and  speech for cognition at skilled nursing facility.   FOLLOW UP:  The patient is to follow up with Dr. Riley Kill on September 06, 2003, at 12:10.  The patient needs to follow up with Dr. Venetia Maxon, neurologist,  at [phone], call for appointment in two weeks.  The patient needs follow up  with primary care Yaretzy Olazabal within four to six weeks.      Junie Bame, P.A.                       Ranelle Oyster, M.D.    LH/MEDQ  D:  07/27/2003  T:  07/27/2003  Job:   [id number]   cc:   Danae Orleans. Venetia Maxon, M.D.  [address]  [place]  [place] [address]  Fax: [phone]   Ranelle Oyster, M.D.  [address]  [place] [address]  Fax: [phone]   Corinna L. Lendell Caprice, MD

## 2010-12-08 NOTE — Op Note (Signed)
[name], [name]                ACCOUNT NO.:  [id number]   MEDICAL RECORD NO.:  [id number]          PATIENT TYPE:  AMB   LOCATION:  ENDO                         FACILITY:  [facility]   PHYSICIAN:  Georgiana Spinner, M.D.    DATE OF BIRTH:  04-Mar-1929   DATE OF PROCEDURE:  04/17/2005  DATE OF DISCHARGE:                                 OPERATIVE REPORT   PROCEDURE:  Colonoscopy.   INDICATIONS:  Colon polyps.   ENDOSCOPIST:  Georgiana Spinner, MD.   ANESTHESIA:  Demerol 20 mg, Versed 2 mg.   DESCRIPTION OF PROCEDURE:  With the patient mildly sedated in the left  lateral decubitus position, the Olympus videoscopic colonoscope was inserted  in the rectum and passed under direct vision to the cecum identified by  ileocecal valve and appendiceal orifice, both of which were photographed.  From this point, the colonoscope was slowly withdrawn, taking  circumferential views of the colonic mucosa, stopping in the rectum, which  appeared normal on direct and showed hemorrhoids on retroflexed view.  The  endoscope was straightened and withdrawn.  The patient's vital signs and  pulse oximeter remained stable.  The patient tolerated the procedure well  without apparent complications.   FINDINGS:  Internal hemorrhoids.  Otherwise an unremarkable colonoscopic  examination to the cecum.   PLAN:  Consider repeat examination in 5 to 10 years.           ______________________________  Georgiana Spinner, M.D.     GMO/MEDQ  D:  04/17/2005  T:  04/17/2005  Job:  [id number]

## 2010-12-21 ENCOUNTER — Encounter: Payer: Self-pay | Admitting: Gastroenterology

## 2010-12-21 ENCOUNTER — Ambulatory Visit (AMBULATORY_SURGERY_CENTER): Payer: Medicare Other | Admitting: Gastroenterology

## 2010-12-21 VITALS — BP 147/75 | HR 60 | Temp 98.1°F | Resp 18 | Ht 65.0 in | Wt 153.0 lb

## 2010-12-21 DIAGNOSIS — D126 Benign neoplasm of colon, unspecified: Secondary | ICD-10-CM

## 2010-12-21 DIAGNOSIS — Z1211 Encounter for screening for malignant neoplasm of colon: Secondary | ICD-10-CM

## 2010-12-21 DIAGNOSIS — Z800 Family history of malignant neoplasm of digestive organs: Secondary | ICD-10-CM

## 2010-12-21 MED ORDER — SODIUM CHLORIDE 0.9 % IV SOLN: 500.0000 mL | INTRAVENOUS | Status: DC

## 2010-12-21 NOTE — Patient Instructions (Signed)
1 polyp.  Otherwise normal colonoscopy.  Await pathology.  See green and blue sheets for additional d/c instructions.

## 2010-12-22 ENCOUNTER — Telehealth: Payer: Self-pay | Admitting: *Deleted

## 2010-12-22 NOTE — Telephone Encounter (Signed)

## 2010-12-26 ENCOUNTER — Encounter: Payer: Self-pay | Admitting: Gastroenterology

## 2011-04-27 ENCOUNTER — Other Ambulatory Visit: Payer: Self-pay | Admitting: Internal Medicine

## 2011-04-27 DIAGNOSIS — Z1231 Encounter for screening mammogram for malignant neoplasm of breast: Secondary | ICD-10-CM

## 2011-06-07 ENCOUNTER — Ambulatory Visit
Admission: RE | Admit: 2011-06-07 | Discharge: 2011-06-07 | Disposition: A | Discharge status: Home / Self Care | Payer: Medicare Other | Source: Ambulatory Visit | Attending: Internal Medicine | Admitting: Internal Medicine

## 2011-06-07 DIAGNOSIS — Z1231 Encounter for screening mammogram for malignant neoplasm of breast: Secondary | ICD-10-CM

## 2011-11-16 ENCOUNTER — Telehealth: Payer: Self-pay | Admitting: Oncology

## 2011-11-16 NOTE — Telephone Encounter (Signed)
lmonvm of the pt regarding an new pt appt with dr Darnelle Catalan. Asked the pt to call me back to confirm the message

## 2011-11-19 ENCOUNTER — Telehealth: Payer: Self-pay | Admitting: Oncology

## 2011-11-19 NOTE — Telephone Encounter (Signed)
Pt called back to confirm that she canmake the appt on 11/30/2011 with dr Darnelle Catalan

## 2011-11-21 ENCOUNTER — Telehealth: Payer: Self-pay | Admitting: Oncology

## 2011-11-21 NOTE — Telephone Encounter (Signed)
Referred by Dr. [name] Dx- R/O Myelodysplastic Syndrome

## 2011-11-30 ENCOUNTER — Telehealth: Payer: Self-pay | Admitting: Oncology

## 2011-11-30 ENCOUNTER — Other Ambulatory Visit (HOSPITAL_BASED_OUTPATIENT_CLINIC_OR_DEPARTMENT_OTHER): Payer: Medicare Other | Admitting: Lab

## 2011-11-30 ENCOUNTER — Encounter: Payer: Self-pay | Admitting: Oncology

## 2011-11-30 ENCOUNTER — Ambulatory Visit: Payer: Medicare Other

## 2011-11-30 ENCOUNTER — Ambulatory Visit (HOSPITAL_BASED_OUTPATIENT_CLINIC_OR_DEPARTMENT_OTHER): Payer: Medicare Other | Admitting: Oncology

## 2011-11-30 VITALS — BP 119/68 | HR 79 | Temp 97.7°F | Ht 65.0 in | Wt 143.2 lb

## 2011-11-30 DIAGNOSIS — I629 Nontraumatic intracranial hemorrhage, unspecified: Secondary | ICD-10-CM

## 2011-11-30 DIAGNOSIS — D72819 Decreased white blood cell count, unspecified: Secondary | ICD-10-CM

## 2011-11-30 DIAGNOSIS — M810 Age-related osteoporosis without current pathological fracture: Secondary | ICD-10-CM

## 2011-11-30 DIAGNOSIS — D473 Essential (hemorrhagic) thrombocythemia: Secondary | ICD-10-CM

## 2011-11-30 DIAGNOSIS — D649 Anemia, unspecified: Secondary | ICD-10-CM

## 2011-11-30 LAB — COMPREHENSIVE METABOLIC PANEL
BUN: 19 mg/dL (ref 6–23)
CO2: 22 mEq/L (ref 19–32)
Calcium: 9 mg/dL (ref 8.4–10.5)
Chloride: 111 mEq/L (ref 96–112)
Creatinine, Ser: 0.69 mg/dL (ref 0.50–1.10)
Glucose, Bld: 96 mg/dL (ref 70–99)

## 2011-11-30 LAB — CBC & DIFF AND RETIC
EOS%: 1.1 % (ref 0.0–7.0)
MCH: 36.8 pg — ABNORMAL HIGH (ref 25.1–34.0)
MCHC: 34.3 g/dL (ref 31.5–36.0)
MCV: 107.2 fL — ABNORMAL HIGH (ref 79.5–101.0)
MONO%: 6 % (ref 0.0–14.0)
RBC: 2.23 10*6/uL — ABNORMAL LOW (ref 3.70–5.45)
RDW: 13.7 % (ref 11.2–14.5)
Retic %: 1.95 % (ref 0.70–2.10)
nRBC: 0 % (ref 0–0)

## 2011-11-30 LAB — LACTATE DEHYDROGENASE: LDH: 146 U/L (ref 94–250)

## 2011-11-30 LAB — SEDIMENTATION RATE: Sed Rate: 18 mm/hr (ref 0–22)

## 2011-11-30 NOTE — Telephone Encounter (Signed)
gve the pt her June 2013 appt calendar. The pt is aware she will be contacted with the biopsy appt

## 2011-11-30 NOTE — Progress Notes (Signed)
ID: [name]   DOB: 31-Dec-1928  MR#: [id number]  CSN#:[id number]  HISTORY OF PRESENT ILLNESS: The patient sees Dr. Pearson Conner for routine health maintenance. On March 2013 he noted that the patient's hemoglobin was 8.7 (previously it had been 11.1). The MCV was at 107.1. The platelet count was 660,000. The white cell count was 4.2. He obtained a B12 and folate which were within normal limits at 1744 and 8.4 respectively.[name] He checked a ferritin to evaluate the thrombocytosis, and this was 151. Serum protein electrophoresis did not show an M spike and the urine protein electrophoresis was likewise negative. He referred the patient for further evaluation. Her subsequent history is as detailed below  INTERVAL HISTORY: The patient tells me she is here because "I am anemic".  REVIEW OF SYSTEMS: The patient denies any "B." symptoms, and specifically has not had any fevers, drenching sweats, unexplained weight loss or fatigue, rash, bleeding, or clotting problems. She walks 1 hour every morning at least 5 days a week. A detailed review of systems was otherwise noncontributory   PAST MEDICAL HISTORY: Past Medical History  Diagnosis Date  . Pacemaker     left shoulder  . Cataract     had surgery  . GERD (gastroesophageal reflux disease)   . Glaucoma   . Hyperlipidemia   . Osteoporosis   . Thyroid disease   . PUD (peptic ulcer disease)   . Lichen sclerosus et atrophicus of the vulva   . Intracranial hemorrhage     PAST SURGICAL HISTORY: Past Surgical History  Procedure Date  . Cataract extraction w/ intraocular lens  implant, bilateral   . Dilation and curettage of uterus   . Tonsillectomy and adenoidectomy   . Pacemaker insertion     left shoulder  . Subdural hematoma evacuation via craniotomy 01-19-04    after a fall  . Colonoscopy     FAMILY HISTORY Family History  Problem Relation Age of Onset  . Colon cancer Mother    the patient's father died at the age of 25 from a myocardial  infarction. Her mother was diagnosed with colon cancer at the age of 46 and died from that disease within a year. The patient had 2 sisters. One died at the age of 23 with a neck fracture. The patient has 2 brothers. There is no other history of cancer or hematologic problems in the family to her knowledge  GYNECOLOGIC HISTORY: She does not recall when she had menarche. She is GX P1, first live birth age 11. She went to the change of life in her 15s. She never took hormone replacement.  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 She had top clearance. Her husband died in 19-Jan-1996 from a heart attack. The patient lives alone, with no pets. Her daughter, [name] lives in [place]. She has a PhD in Water quality scientist and works for [employer]. The patient's brother [name] lives next door and in case of an emergency she will like him call first. His phone number is [phone]   ADVANCED DIRECTIVES: in place  HEALTH MAINTENANCE: History  Substance Use Topics  . Smoking status: Former Games developer  . Smokeless tobacco: Never Used  . Alcohol Use: No     Colonoscopy: [name] 19-Jan-2011 PAP: "no longer"  Bone density: osteoporosis   Lipid panel: [name]             Mammogram: Nov 2012  Allergies  Allergen  Reactions  . Sulfa Antibiotics     Pt. States she may have had a reaction a long time ago    Current Outpatient Prescriptions  Medication Sig Dispense Refill  . alendronate (FOSAMAX) 70 MG tablet Take 70 mg by mouth every 7 (seven) days. Take with a full glass of water on an empty stomach.       [name] aspirin 325 MG EC tablet Take 325 mg by mouth daily.        . bimatoprost (LUMIGAN) 0.03 % ophthalmic solution Place 1 drop into both eyes at bedtime.        . brinzolamide (AZOPT) 1 % ophthalmic suspension Place 1 drop into both eyes 2 (two) times daily.        . Calcium Carbonate-Vitamin D (CALTRATE 600+D PO) Take 1 tablet by mouth 2 (two) times daily  with breakfast and lunch.        . Cholecalciferol (VITAMIN D) 2000 UNITS tablet Take 2,000 Units by mouth 2 (two) times daily.        . Cyanocobalamin (VITAMIN B 12 PO) Take 2,000 mcg by mouth daily.      . folic acid (FOLVITE) 400 MCG tablet Take 400 mcg by mouth daily.      [name] levothyroxine (SYNTHROID, LEVOTHROID) 75 MCG tablet Take 88 mcg by mouth daily.       . Multiple Vitamins-Minerals (MACULAR VITAMIN BENEFIT PO) Take 2 tablets by mouth daily.      [name] nystatin cream (MYCOSTATIN) Apply topically as needed.      . OMEGA-3 KRILL OIL 300 MG CAPS Take 1 capsule by mouth daily.        [name] omeprazole (PRILOSEC) 20 MG capsule Take 20 mg by mouth daily.        . pravastatin (PRAVACHOL) 40 MG tablet Take 40 mg by mouth daily.        . Zinc-Magnesium Aspart-Vit B6 (ZINC MAGNESIUM ASPARTATE PO) Take 1 tablet by mouth daily.        . Multiple Vitamins-Minerals (MULTIVITAMIN WITH MINERALS) tablet Take 1 tablet by mouth daily.         Current Facility-Administered Medications  Medication Dose Route Frequency Provider Last Rate Last Dose  . 0.9 %  sodium chloride infusion  500 mL Intravenous Continuous Meryl Dare, MD,FACG        OBJECTIVE: Late middle-aged white woman in no acute distress Filed Vitals:   11/30/11 1048  BP: 119/68  Pulse: 79  Temp: 97.7 F (36.5 C)     Body mass index is 23.83 kg/(m^2).    ECOG FS: 1  Sclerae unicteric Oropharynx clear No peripheral adenopathy Lungs no rales or rhonchi Heart regular rate and rhythm Abd benign, specifically no splenomegaly by exam MSK no focal spinal tenderness, no peripheral edema Neuro: nonfocal   LAB RESULTS: Lab Results  Component Value Date   WBC 1.8* 11/30/2011   NEUTROABS 0.9* 11/30/2011   HGB 8.2* 11/30/2011   HCT 23.9* 11/30/2011   MCV 107.2* 11/30/2011   PLT 817* 11/30/2011      Chemistry   No results found for this basename: NA, K, CL, CO2, BUN, CREATININE, GLU   No results found for this basename: CALCIUM, ALKPHOS, AST,  ALT, BILITOT       No results found for this basename: LABCA2    No components found with this basename: [id number]    No results found for this basename: INR:1;PROTIME:1 in the last 168 hours  Urinalysis No results found for  this basename: colorurine, appearanceur, labspec, phurine, glucoseu, hgbur, bilirubinur, ketonesur, proteinur, urobilinogen, nitrite, leukocytesur    STUDIES: Review of the peripheral blood film confirms a thrombocytosis. There are no schistocytes or spherocytes noted. The white cells are not immature. In fact neutrophils and lymphocytes look quite bland.  ASSESSMENT: 78 year old [place] woman with progressive anemia, leukopenia, and thrombocytosis, with a very elevated MCV, but normal B12, folate, ferritin, and electrophoreses.   PLAN: We discussed her situation for the better part of her hour-long visit today. 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 This will require bone marrow biopsy and cytogenetics, and this is being separately scheduled. Because the cytogenetics take approximately 2 weeks, she will return to see me in early June, and at that point we should at be able to discuss treatment options with a definitive diagnosis at hand. Multiple other labs were obtained today, since a potential diagnosis of essential thrombocytosis (although I do not believe that is what she has) is a diagnosis of exclusion. I gave the patient the relevant information in writing. She knows to call for any problems that may develop before the next visit.   [name] C    11/30/2011

## 2011-12-04 ENCOUNTER — Other Ambulatory Visit: Payer: Self-pay | Admitting: Oncology

## 2011-12-04 LAB — JAK2 GENOTYPR

## 2011-12-09 ENCOUNTER — Other Ambulatory Visit: Payer: Self-pay | Admitting: Radiology

## 2011-12-10 ENCOUNTER — Other Ambulatory Visit: Payer: Self-pay | Admitting: Physician Assistant

## 2011-12-11 ENCOUNTER — Ambulatory Visit (HOSPITAL_COMMUNITY)
Admission: RE | Admit: 2011-12-11 | Discharge: 2011-12-11 | Disposition: A | Discharge status: Home / Self Care | Payer: Medicare Other | Source: Ambulatory Visit | Attending: Oncology | Admitting: Oncology

## 2011-12-11 ENCOUNTER — Encounter (HOSPITAL_COMMUNITY): Payer: Self-pay

## 2011-12-11 DIAGNOSIS — D473 Essential (hemorrhagic) thrombocythemia: Secondary | ICD-10-CM | POA: Insufficient documentation

## 2011-12-11 DIAGNOSIS — Q068 Other specified congenital malformations of spinal cord: Secondary | ICD-10-CM | POA: Insufficient documentation

## 2011-12-11 DIAGNOSIS — I629 Nontraumatic intracranial hemorrhage, unspecified: Secondary | ICD-10-CM | POA: Insufficient documentation

## 2011-12-11 DIAGNOSIS — D649 Anemia, unspecified: Secondary | ICD-10-CM | POA: Insufficient documentation

## 2011-12-11 DIAGNOSIS — C9590 Leukemia, unspecified not having achieved remission: Secondary | ICD-10-CM | POA: Insufficient documentation

## 2011-12-11 DIAGNOSIS — D72819 Decreased white blood cell count, unspecified: Secondary | ICD-10-CM

## 2011-12-11 LAB — CBC
Hemoglobin: 8.1 g/dL — ABNORMAL LOW (ref 12.0–15.0)
MCHC: 34.9 g/dL (ref 30.0–36.0)
Platelets: 887 10*3/uL — ABNORMAL HIGH (ref 150–400)
RDW: 13.3 % (ref 11.5–15.5)

## 2011-12-11 LAB — PROTIME-INR
INR: 1.15 (ref 0.00–1.49)
Prothrombin Time: 14.9 seconds (ref 11.6–15.2)

## 2011-12-11 LAB — APTT: aPTT: 28 seconds (ref 24–37)

## 2011-12-11 MED ORDER — MIDAZOLAM HCL 2 MG/2ML IJ SOLN
INTRAMUSCULAR | Status: AC
  Filled 2011-12-11: qty 6

## 2011-12-11 MED ORDER — SODIUM CHLORIDE 0.9 % IV SOLN: INTRAVENOUS | Status: DC

## 2011-12-11 MED ORDER — FENTANYL CITRATE 0.05 MG/ML IJ SOLN
INTRAMUSCULAR | Status: AC
  Filled 2011-12-11: qty 6

## 2011-12-11 NOTE — Procedures (Signed)
Technically successful CT guided bone marrow aspiration and biopsy of left iliac crest. No immediate complications. Awaiting pathology report.    

## 2011-12-11 NOTE — H&P (Signed)
[name] is an 78 y.o. female.   Chief Complaint: "I'm here for a bone marrow biopsy" HPI: Patient with history of anemia, leukopenia, and thrombocytosis presents today for CT guided bone marrow biopsy.  Past Medical History  Diagnosis Date  . Pacemaker     left shoulder  . Cataract     had surgery  . GERD (gastroesophageal reflux disease)   . Glaucoma   . Hyperlipidemia   . Osteoporosis   . Thyroid disease   . PUD (peptic ulcer disease)   . Lichen sclerosus et atrophicus of the vulva   . Intracranial hemorrhage     Past Surgical History  Procedure Date  . Cataract extraction w/ intraocular lens  implant, bilateral   . Dilation and curettage of uterus   . Tonsillectomy and adenoidectomy   . Pacemaker insertion     left shoulder  . Subdural hematoma evacuation via craniotomy 2005    after a fall  . Colonoscopy     Family History  Problem Relation Age of Onset  . Colon cancer Mother    Social History:  reports that she has quit smoking. She has never used smokeless tobacco. She reports that she does not drink alcohol or use illicit drugs.  Allergies:  Allergies  Allergen Reactions  . Sulfa Antibiotics     Pt. States she may have had a reaction a long time ago    Current outpatient prescriptions:alendronate (FOSAMAX) 70 MG tablet, Take 70 mg by mouth every 7 (seven) days. Take with a full glass of water on an empty stomach. Takes on Saturdays, Disp: , Rfl: ;  aspirin 325 MG EC tablet, Take 325 mg by mouth daily. , Disp: , Rfl: ;  bimatoprost (LUMIGAN) 0.03 % ophthalmic solution, Place 1 drop into both eyes at bedtime.  , Disp: , Rfl:  brinzolamide (AZOPT) 1 % ophthalmic suspension, Place 1 drop into both eyes 2 (two) times daily.  , Disp: , Rfl: ;  Calcium Carbonate-Vitamin D (CALTRATE 600+D PO), Take 1 tablet by mouth 2 (two) times daily with breakfast and lunch.  , Disp: , Rfl: ;  Cholecalciferol (VITAMIN D) 2000 UNITS tablet, Take 2,000 Units by mouth 2 (two) times  daily.  , Disp: , Rfl: ;  Cyanocobalamin (VITAMIN B 12 PO), Take 2,000 mcg by mouth daily., Disp: , Rfl:  folic acid (FOLVITE) 400 MCG tablet, Take 400 mcg by mouth daily., Disp: , Rfl: ;  levothyroxine (SYNTHROID, LEVOTHROID) 88 MCG tablet, Take 88 mcg by mouth daily., Disp: , Rfl: ;  Multiple Vitamins-Minerals (MACULAR VITAMIN BENEFIT PO), Take 2 tablets by mouth daily., Disp: , Rfl: ;  nystatin cream (MYCOSTATIN), Apply 1 application topically as needed. , Disp: , Rfl: ;  OMEGA-3 KRILL OIL 300 MG CAPS, Take 1 capsule by mouth daily.  , Disp: , Rfl:  omeprazole (PRILOSEC) 20 MG capsule, Take 20 mg by mouth daily.  , Disp: , Rfl: ;  pravastatin (PRAVACHOL) 40 MG tablet, Take 40 mg by mouth daily.  , Disp: , Rfl: ;  Zinc-Magnesium Aspart-Vit B6 (ZINC MAGNESIUM ASPARTATE PO), Take 1 tablet by mouth daily.  , Disp: , Rfl:  Current facility-administered medications:0.9 %  sodium chloride infusion, , Intravenous, Continuous, D Kevin Kasch Borquez, PA;  fentaNYL (SUBLIMAZE) 0.05 MG/ML injection, , , , ;  midazolam (VERSED) 2 MG/2ML injection, , , ,   Results for orders placed during the hospital encounter of 12/11/11  CBC      Component Value Range  WBC 2.0 (*) 4.0 - 10.5 (K/uL)   RBC 2.13 (*) 3.87 - 5.11 (MIL/uL)   Hemoglobin 8.1 (*) 12.0 - 15.0 (g/dL)   HCT 40.9 (*) 81.1 - 46.0 (%)   MCV 108.9 (*) 78.0 - 100.0 (fL)   MCH 38.0 (*) 26.0 - 34.0 (pg)   MCHC 34.9  30.0 - 36.0 (g/dL)   RDW 91.4  78.2 - 95.6 (%)   Platelets 887 (*) 150 - 400 (K/uL)    Review of Systems  Constitutional: Negative for fever and chills.  Respiratory: Negative for cough and shortness of breath.   Cardiovascular: Negative for chest pain.  Gastrointestinal: Negative for nausea, vomiting and abdominal pain.  Musculoskeletal: Positive for back pain.  Neurological: Negative for headaches.  Endo/Heme/Allergies: Does not bruise/bleed easily.    Blood pressure 128/58, pulse 76, temperature 97.8 F (36.6 C), temperature source Oral,  resp. rate 18, height 5\' 6"  (1.676 m), weight 143 lb (64.864 kg), SpO2 100.00%. Physical Exam  Constitutional: She is oriented to person, place, and time. She appears well-developed and well-nourished.  Cardiovascular: Normal rate and regular rhythm.        Has pacer  Respiratory: Effort normal and breath sounds normal.  GI: Soft. Bowel sounds are normal. There is no tenderness.  Musculoskeletal: Normal range of motion. She exhibits no edema.  Neurological: She is alert and oriented to person, place, and time.  Psychiatric:       Flat affect     Assessment/Plan Patient with anemia, leukopenia and thrombocytosis; plan is for CT guided bone marrow biopsy. Details/risks of procedure d/w pt with her understanding and consent.  Teyana Pierron,D KEVIN 12/11/2011, 8:36 AM

## 2011-12-11 NOTE — Discharge Instructions (Signed)
Remove dressing in AM and shower. Contact radiology for any increased pain, bleeding questions or concerns [phone]    Bone Marrow Aspiration, Bone Marrow Biopsy Care After Read the instructions outlined below and refer to this sheet in the next few weeks. These discharge instructions provide you with general information on caring for yourself after you leave the hospital. Your caregiver may also give you specific instructions. While your treatment has been planned according to the most current medical practices available, unavoidable complications occasionally occur. If you have any problems or questions after discharge, call your caregiver. FINDING OUT THE RESULTS OF YOUR TEST Not all test results are available during your visit. If your test results are not back during the visit, make an appointment with your caregiver to find out the results. Do not assume everything is normal if you have not heard from your caregiver or the medical facility. It is important for you to follow up on all of your test results.  HOME CARE INSTRUCTIONS  You have had sedation and may be sleepy or dizzy. Your thinking may not be as clear as usual. For the next 24 hours:  Only take over-the-counter or prescription medicines for pain, discomfort, and or fever as directed by your caregiver.   Do not drink alcohol.   Do not smoke.   Do not drive.   Do not make important legal decisions.   Do not operate heavy machinery.   Do not care for small children by yourself.   Keep your dressing clean and dry. You may replace dressing with a bandage after 24 hours.   You may take a bath or shower after 24 hours.   Use an ice pack for 20 minutes every 2 hours while awake for pain as needed.  SEEK MEDICAL CARE IF:   There is redness, swelling, or increasing pain at the biopsy site.   There is pus coming from the biopsy site.   There is drainage from a biopsy site lasting longer than one day.   An unexplained  oral temperature above 102 F (38.9 C) develops.  SEEK IMMEDIATE MEDICAL CARE IF:   You develop a rash.   You have difficulty breathing.   You develop any reaction or side effects to medications given.                                                                                                  Moderate Sedation, Adult Moderate sedation is given to help you relax or even sleep through a procedure. You may remain sleepy, be clumsy, or have poor balance for several hours following this procedure. Arrange for a responsible adult, family member, or friend to take you home. A responsible adult should stay with you for at least 24 hours or until the medicines have worn off.  Do not participate in any activities where you could become injured for the next 24 hours, or until you feel normal again. Do not:   Drive.   Swim.   Ride a bicycle.   Operate heavy machinery.   Cook.   Use power tools.  Climb ladders.   Work at [employer].   Do not make important decisions or sign legal documents until you are improved.   Vomiting may occur if you eat too soon. When you can drink without vomiting, try water, juice, or soup. Try solid foods if you feel little or no nausea.   Only take over-the-counter or prescription medications for pain, discomfort, or fever as directed by your caregiver.If pain medications have been prescribed for you, ask your caregiver how soon it is safe to take them.   Make sure you and your family fully understands everything about the medication given to you. Make sure you understand what side effects may occur.   You should not drink alcohol, take sleeping pills, or medications that cause drowsiness for at least 24 hours.   If you smoke, do not smoke alone.   If you are feeling better, you may resume normal activities 24 hours after receiving sedation.   Keep all appointments as scheduled. Follow all instructions.   Ask questions if you do not understand.  SEEK  MEDICAL CARE IF:   Your skin is pale or bluish in color.   You continue to feel sick to your stomach (nauseous) or throw up (vomit).   Your pain is getting worse and not helped by medication.   You have bleeding or swelling.   You are still sleepy or feeling clumsy after 24 hours.  SEEK IMMEDIATE MEDICAL CARE IF:   You develop a rash.   You have difficulty breathing.   You develop any type of allergic problem.   You have a fever.  Document Released: 04/03/2001 Document Revised: 06/28/2011 Document Reviewed: 08/25/2007 [facility] Patient Information 2012 [place], [place].

## 2011-12-11 NOTE — Progress Notes (Signed)
Ambulated to BR with minimal assist and tolerated this well. Voided moderate amt clear yellow urine. Back to bed and eating breakfast.

## 2011-12-18 ENCOUNTER — Other Ambulatory Visit: Payer: Self-pay | Admitting: Oncology

## 2011-12-18 ENCOUNTER — Telehealth: Payer: Self-pay | Admitting: *Deleted

## 2011-12-18 DIAGNOSIS — D649 Anemia, unspecified: Secondary | ICD-10-CM

## 2011-12-18 NOTE — Telephone Encounter (Signed)
per orders from 12-18-2011 moved 12-21-2011 labs 11:15am md 11:30am patient confirmed over the phone

## 2011-12-18 NOTE — Progress Notes (Signed)
I called [name] today and alerted her to her possible diagnoses, making her an earlier appointment with me, and I also let her know she will be been contacted by Dr. Lowell Guitar from [facility].

## 2011-12-20 LAB — TISSUE HYBRIDIZATION (BONE MARROW)-NCBH

## 2011-12-21 ENCOUNTER — Other Ambulatory Visit (HOSPITAL_BASED_OUTPATIENT_CLINIC_OR_DEPARTMENT_OTHER): Payer: Medicare Other | Admitting: Lab

## 2011-12-21 ENCOUNTER — Ambulatory Visit (HOSPITAL_BASED_OUTPATIENT_CLINIC_OR_DEPARTMENT_OTHER): Payer: Medicare Other | Admitting: Oncology

## 2011-12-21 VITALS — BP 119/64 | HR 82 | Temp 97.9°F | Ht 66.0 in | Wt 141.7 lb

## 2011-12-21 DIAGNOSIS — D649 Anemia, unspecified: Secondary | ICD-10-CM

## 2011-12-21 DIAGNOSIS — C9400 Acute erythroid leukemia, not having achieved remission: Secondary | ICD-10-CM

## 2011-12-21 LAB — CBC WITH DIFFERENTIAL/PLATELET
Basophils Absolute: 0 10*3/uL (ref 0.0–0.1)
EOS%: 1.1 % (ref 0.0–7.0)
HCT: 23.1 % — ABNORMAL LOW (ref 34.8–46.6)
HGB: 8 g/dL — ABNORMAL LOW (ref 11.6–15.9)
MCH: 38.9 pg — ABNORMAL HIGH (ref 25.1–34.0)
MCV: 112 fL — ABNORMAL HIGH (ref 79.5–101.0)
MONO%: 5.2 % (ref 0.0–14.0)
NEUT%: 48.4 % (ref 38.4–76.8)
Platelets: 901 10*3/uL — ABNORMAL HIGH (ref 145–400)

## 2011-12-23 DIAGNOSIS — C9400 Acute erythroid leukemia, not having achieved remission: Secondary | ICD-10-CM | POA: Insufficient documentation

## 2011-12-23 NOTE — Progress Notes (Signed)
ID: [name]   DOB: [birth year]  MR#: [id number]  CSN#:[id number]  HISTORY OF PRESENT ILLNESS: The patient sees [name] for routine health maintenance. On March 2013 he noted that the patient's hemoglobin was 8.7 (previously it had been 11.1). The MCV was at 107.1. The platelet count was 660,000. The white cell count was 4.2. He obtained a B12 and folate which were within normal limits at 1744 and 8.4 respectively.[name] He checked a ferritin to evaluate the thrombocytosis, and this was 151. Serum protein electrophoresis did not show an M spike and the urine protein electrophoresis was likewise negative. He referred the patient for further evaluation. 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 [facility] the [facility] and he will be seeing her on 01/04/2012. She is here today for a Gen. orientation as to her diagnosis.  REVIEW OF SYSTEMS: [name] remains remarkably asymptomatic. She denies any shortness of breath, unusual fatigue, palpitations, pallor, weakness, dizziness or balance problems, as well as no fevers, drenching sweats, unexplained weight loss, pain, rash, or bleeding. In fact except for her chronic issues regarding her pacemaker and hearing loss a detailed review of systems today was negative.  PAST MEDICAL HISTORY: Past Medical History  Diagnosis Date  . Pacemaker     left shoulder  . Cataract     had surgery  . GERD (gastroesophageal reflux disease)   . Glaucoma   . Hyperlipidemia   . Osteoporosis   . Thyroid disease   . PUD (peptic ulcer disease)   . Lichen sclerosus et atrophicus of the vulva   . Intracranial hemorrhage     PAST SURGICAL HISTORY: Past Surgical History  Procedure Date  . Cataract extraction w/ intraocular lens  implant, bilateral   . Dilation and curettage of uterus    . Tonsillectomy and adenoidectomy   . Pacemaker insertion     left shoulder  . Subdural hematoma evacuation via craniotomy 2004-01-05    after a fall  . Colonoscopy     FAMILY HISTORY Family History  Problem Relation Age of Onset  . Colon cancer Mother    the patient's father died at the age of 27 from a myocardial infarction. Her mother was diagnosed with colon cancer at the age of 42 and died from that disease within a year. The patient had 2 sisters. One died at the age of 17 with a neck fracture. The patient has 2 brothers. There is no other history of cancer or hematologic problems in the family to her knowledge  GYNECOLOGIC HISTORY: She does not recall when she had menarche. She is GX P1, first live birth age 27. She went to the change of life in her 17s. She never took hormone replacement.  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 Her husband died in 1996-01-05 from a heart attack. The patient lives alone, with no pets. Her daughter, [name] lives in [place]. She has a PhD in Water quality scientist and works for [employer]. The patient's brother [name] lives next door and in case of an emergency she will like him call first. His phone number is [phone]   ADVANCED DIRECTIVES: in place  HEALTH MAINTENANCE: History  Substance Use Topics  . Smoking status: Former Games developer  .  Smokeless tobacco: Never Used  . Alcohol Use: No     Colonoscopy: [facility]/ May 2012  PAP: "no longer"  Bone density: osteoporosis   Lipid panel: [name]             Mammogram: Nov 2012  Allergies  Allergen Reactions  . Sulfa Antibiotics     Pt. States she may have had a reaction a long time ago    Current Outpatient Prescriptions  Medication Sig Dispense Refill  . alendronate (FOSAMAX) 70 MG tablet Take 70 mg by mouth every 7 (seven) days. Take with a full glass of water on an empty stomach. Takes on Saturdays      . aspirin  325 MG EC tablet Take 325 mg by mouth daily.       . bimatoprost (LUMIGAN) 0.03 % ophthalmic solution Place 1 drop into both eyes at bedtime.        . brinzolamide (AZOPT) 1 % ophthalmic suspension Place 1 drop into both eyes 2 (two) times daily.        . Calcium Carbonate-Vitamin D (CALTRATE 600+D PO) Take 1 tablet by mouth 2 (two) times daily with breakfast and lunch.        . Cholecalciferol (VITAMIN D) 2000 UNITS tablet Take 2,000 Units by mouth 2 (two) times daily.        . Cyanocobalamin (VITAMIN B 12 PO) Take 2,000 mcg by mouth daily.      . folic acid (FOLVITE) 400 MCG tablet Take 400 mcg by mouth daily.      [name] levothyroxine (SYNTHROID, LEVOTHROID) 88 MCG tablet Take 88 mcg by mouth daily.      . Multiple Vitamins-Minerals (MACULAR VITAMIN BENEFIT PO) Take 2 tablets by mouth daily.      [name] nystatin cream (MYCOSTATIN) Apply 1 application topically as needed.       . OMEGA-3 KRILL OIL 300 MG CAPS Take 1 capsule by mouth daily.        [name] omeprazole (PRILOSEC) 20 MG capsule Take 20 mg by mouth daily.        . pravastatin (PRAVACHOL) 40 MG tablet Take 40 mg by mouth daily.        . Zinc-Magnesium Aspart-Vit B6 (ZINC MAGNESIUM ASPARTATE PO) Take 1 tablet by mouth daily.          OBJECTIVE: Elderly white woman in no acute distress Filed Vitals:   12/21/11 1204  BP: 119/64  Pulse: 82  Temp: 97.9 F (36.6 C)     Body mass index is 22.87 kg/(m^2).    ECOG FS: 1  Sclerae unicteric Oropharynx clear No peripheral adenopathy Lungs no rales or rhonchi Heart regular rate and rhythm Abd benign, specifically no splenomegaly by exam MSK no focal spinal tenderness, no peripheral edema Neuro: nonfocal   LAB RESULTS: CBC today is essentially unchanged as compared to her original visit here 2 weeks ago. Other labs of interest include negative BCR-ABL and JAK-2  Lab Results  Component Value Date   WBC 2.0* 12/21/2011   NEUTROABS 1.0* 12/21/2011   HGB 8.0* 12/21/2011   HCT 23.1* 12/21/2011   MCV  112.0* 12/21/2011   PLT 901* 12/21/2011      Chemistry      Component Value Date/Time   NA 142 11/30/2011 1034      Component Value Date/Time   CALCIUM 9.0 11/30/2011 1034       No results found for this basename: LABCA2    No components found with this basename: [id number]  No results found for this basename: INR:1;PROTIME:1 in the last 168 hours  Urinalysis No results found for this basename: colorurine,  appearanceur,  labspec,  phurine,  glucoseu,  hgbur,  bilirubinur,  ketonesur,  proteinur,  urobilinogen,  nitrite,  leukocytesur    STUDIES: Bone marrow biopsy shows a hypercellular marrow with approximately 70% erythroid predominance. There is significant dysplasia in the red cell series. There are ring sideroblasts. There is an increased number of blasts, up to 10-15% of all cells. This is confirmed by flow cytometry which shows 22% of all cells caring and ambiguous phenotype (CD13, CD33, CD34, and CD3 positive.). Myeloma peroxidase is negative. There is also a striking megakaryocytic proliferation. Cytogenetics show a t(3,3) translocation, but no 5, 7, or 8 abnormalities.  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 She understands standard therapy for this in younger patients is extremely harsh, and in older patients carries a very high treatment related mortality. The "standard of care" for patients her age would be a clinical trial. I am accordingly referring her to wait for a for further discussion of her treatment options.  Lorenzo is very clearheaded. She understands this is not good news. I  have asked her to make sure all her legal papers are in place and she is going to update her healthcare power of attorney. She would like me to discuss the situation with her daughter, who has a PhD in neuropsychology, but unfortunately lives of in [place]. I also discussed with her the possibility of using is azacytidine if that is Dr. Roanna Banning suggestion, and she has some written information regarding that medication. I have made a return appointment here for June 21 so we may further discuss and implement her treatment plan. If she decides to be treated primarily through [facility], she will call and reschedule on an as-needed basis.    Zelta Enfield C    12/23/2011

## 2011-12-31 ENCOUNTER — Ambulatory Visit: Payer: Medicare Other | Admitting: Oncology

## 2011-12-31 ENCOUNTER — Other Ambulatory Visit: Payer: Medicare Other | Admitting: Lab

## 2012-01-07 ENCOUNTER — Other Ambulatory Visit: Payer: Self-pay | Admitting: Oncology

## 2012-01-07 ENCOUNTER — Telehealth: Payer: Self-pay | Admitting: Oncology

## 2012-01-07 NOTE — Progress Notes (Signed)
I discussed [name] situation with Dr. Miachel Roux. He feels in document therapy might be more than the patient can tolerate. He agrees we should try is S. ICD-9, at 75 mg per meter squared, perhaps doing the first cycle subcutaneously, and if she doesn't have too much of a local reaction we could continue that way, otherwise place a port. This would be given Monday through Friday and then the next Monday and Tuesday (with a weekend break).  [name] is very eager to get started. Accordingly we are going to try to fit her in this week to see if we can start her treatments on June 24.

## 2012-01-07 NOTE — Telephone Encounter (Signed)
S/w the pt and she is aware of her June 19th appt with the pa

## 2012-01-09 ENCOUNTER — Ambulatory Visit (HOSPITAL_BASED_OUTPATIENT_CLINIC_OR_DEPARTMENT_OTHER): Payer: Medicare Other | Admitting: Physician Assistant

## 2012-01-09 ENCOUNTER — Other Ambulatory Visit (HOSPITAL_BASED_OUTPATIENT_CLINIC_OR_DEPARTMENT_OTHER): Payer: Medicare Other | Admitting: Lab

## 2012-01-09 ENCOUNTER — Telehealth: Payer: Self-pay | Admitting: Oncology

## 2012-01-09 ENCOUNTER — Encounter: Payer: Self-pay | Admitting: Physician Assistant

## 2012-01-09 VITALS — BP 113/68 | HR 83 | Temp 97.8°F | Ht 66.0 in | Wt 139.9 lb

## 2012-01-09 DIAGNOSIS — D649 Anemia, unspecified: Secondary | ICD-10-CM

## 2012-01-09 DIAGNOSIS — D473 Essential (hemorrhagic) thrombocythemia: Secondary | ICD-10-CM

## 2012-01-09 DIAGNOSIS — D72819 Decreased white blood cell count, unspecified: Secondary | ICD-10-CM

## 2012-01-09 DIAGNOSIS — C9400 Acute erythroid leukemia, not having achieved remission: Secondary | ICD-10-CM

## 2012-01-09 DIAGNOSIS — I629 Nontraumatic intracranial hemorrhage, unspecified: Secondary | ICD-10-CM

## 2012-01-09 LAB — CBC WITH DIFFERENTIAL/PLATELET
BASO%: 0.2 % (ref 0.0–2.0)
Eosinophils Absolute: 0 10*3/uL (ref 0.0–0.5)
LYMPH%: 47.4 % (ref 14.0–49.7)
MCHC: 34.8 g/dL (ref 31.5–36.0)
MCV: 113.5 fL — ABNORMAL HIGH (ref 79.5–101.0)
MONO%: 5.5 % (ref 0.0–14.0)
NEUT%: 46.2 % (ref 38.4–76.8)
Platelets: 1051 10*3/uL — ABNORMAL HIGH (ref 145–400)
RBC: 2.11 10*6/uL — ABNORMAL LOW (ref 3.70–5.45)

## 2012-01-09 MED ORDER — ONDANSETRON HCL 8 MG PO TABS: 8.0000 mg | ORAL_TABLET | Freq: Two times a day (BID) | ORAL | Status: DC | PRN

## 2012-01-09 MED ORDER — ONDANSETRON HCL 8 MG PO TABS: 8.0000 mg | ORAL_TABLET | Freq: Two times a day (BID) | ORAL | Status: AC | PRN

## 2012-01-09 NOTE — Telephone Encounter (Signed)
gve the pt her June-aug 2013 appt calendar

## 2012-01-09 NOTE — Progress Notes (Signed)
ID: [name]   DOB: 1928-12-03  MR#: [id number]  WUJ#:[id number]  HISTORY OF PRESENT ILLNESS: The patient sees Dr. [name] for routine health maintenance. On March 2013 he noted that the patient's hemoglobin was 8.7 (previously it had been 11.1). The MCV was at 107.1. The platelet count was 660,000. The white cell count was 4.2. He obtained a B12 and folate which were within normal limits at 1744 and 8.4 respectively.[name] He checked a ferritin to evaluate the thrombocytosis, and this was 151. Serum protein electrophoresis did not show an M spike and the urine protein electrophoresis was likewise negative. He referred the patient for further evaluation. Her subsequent history is as detailed below  INTERVAL HISTORY:  Nefertari returns today for followup of her recently diagnosed acute a refill right leukemia. Since her last appointment here, she met with Dr. Miachel Roux at [facility], who suggested therapy with azacitidine given at a dose of 75 mg per meter squared. With the exception of the "parking situation" at  [facility], Tiffny was very pleased with her appointment there and felt comfortable with Dr. Roanna Banning recommendations.   REVIEW OF SYSTEMS: Glada remains remarkably asymptomatic. In fact she tells me if she did not know she had this illness, she would not know she was sick at all. She's had no recent fevers, chills, or night sweats. No rashes or abnormal bleeding. No evidence of abnormal clotting. No nausea or change in bowel habits. No cough, shortness of breath, or chest pain. (She does have a pacemaker, and is followed by Dr. Thurmon Fair at [facility]). No abnormal headaches or dizziness. No peripheral swelling or unusual myalgias, arthralgias, or bony pain.  A detailed review of systems is otherwise noncontributory.   PAST MEDICAL HISTORY: Past Medical History  Diagnosis Date  . Pacemaker     left shoulder  . Cataract     had surgery  . GERD (gastroesophageal  reflux disease)   . Glaucoma   . Hyperlipidemia   . Osteoporosis   . Thyroid disease   . PUD (peptic ulcer disease)   . Lichen sclerosus et atrophicus of the vulva   . Intracranial hemorrhage     PAST SURGICAL HISTORY: Past Surgical History  Procedure Date  . Cataract extraction w/ intraocular lens  implant, bilateral   . Dilation and curettage of uterus   . Tonsillectomy and adenoidectomy   . Pacemaker insertion     left shoulder  . Subdural hematoma evacuation via craniotomy 01-17-2004    after a fall  . Colonoscopy     FAMILY HISTORY Family History  Problem Relation Age of Onset  . Colon cancer Mother    the patient's father died at the age of 44 from a myocardial infarction. Her mother was diagnosed with colon cancer at the age of 69 and died from that disease within a year. The patient had 2 sisters. One died at the age of 64 with a neck fracture. The patient has 2 brothers. There is no other history of cancer or hematologic problems in the family to her knowledge  GYNECOLOGIC HISTORY: She does not recall when she had menarche. She is GX P1, first live birth age 37. She went to the change of life in her 40s. She never took hormone replacement.  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 Her husband died in 17-Jan-1996 from a heart attack. The patient lives alone, with  no pets. Her daughter, [name] lives in [place]. She has a PhD in Water quality scientist and works for [employer]. The patient's brother [name] lives next door and in case of an emergency she will like him call first. His phone number is [phone]   ADVANCED DIRECTIVES: in place  HEALTH MAINTENANCE: History  Substance Use Topics  . Smoking status: Former Games developer  . Smokeless tobacco: Never Used  . Alcohol Use: No     Colonoscopy: [facility]/ May 2012  PAP: "no longer"  Bone density: osteoporosis   Lipid panel: [name]              Mammogram: Nov 2012  Allergies  Allergen Reactions  . Sulfa Antibiotics     Pt. States she may have had a reaction a long time ago    Current Outpatient Prescriptions  Medication Sig Dispense Refill  . alendronate (FOSAMAX) 70 MG tablet Take 70 mg by mouth every 7 (seven) days. Take with a full glass of water on an empty stomach. Takes on Saturdays      . aspirin 325 MG EC tablet Take 325 mg by mouth daily.       . bimatoprost (LUMIGAN) 0.03 % ophthalmic solution Place 1 drop into both eyes at bedtime.        . brinzolamide (AZOPT) 1 % ophthalmic suspension Place 1 drop into both eyes 2 (two) times daily.        . Calcium Carbonate-Vitamin D (CALTRATE 600+D PO) Take 1 tablet by mouth 2 (two) times daily with breakfast and lunch.        . Cholecalciferol (VITAMIN D) 2000 UNITS tablet Take 2,000 Units by mouth 2 (two) times daily.        . Cyanocobalamin (VITAMIN B 12 PO) Take 2,000 mcg by mouth daily.      . folic acid (FOLVITE) 400 MCG tablet Take 400 mcg by mouth daily.      [name] levothyroxine (SYNTHROID, LEVOTHROID) 88 MCG tablet Take 88 mcg by mouth daily.      . Multiple Vitamins-Minerals (MACULAR VITAMIN BENEFIT PO) Take 2 tablets by mouth daily.      [name] nystatin cream (MYCOSTATIN) Apply 1 application topically as needed.       . OMEGA-3 KRILL OIL 300 MG CAPS Take 1 capsule by mouth daily.        [name] omeprazole (PRILOSEC) 20 MG capsule Take 20 mg by mouth daily.        . ondansetron (ZOFRAN) 8 MG tablet Take 1 tablet (8 mg total) by mouth every 12 (twelve) hours as needed for nausea.  30 tablet  2  . ondansetron (ZOFRAN) 8 MG tablet Take 1 tablet (8 mg total) by mouth 2 (two) times daily as needed (Nausea or vomiting).  30 tablet  1  . pravastatin (PRAVACHOL) 40 MG tablet Take 40 mg by mouth daily.        . Zinc-Magnesium Aspart-Vit B6 (ZINC MAGNESIUM ASPARTATE PO) Take 1 tablet by mouth daily.          OBJECTIVE: Elderly white woman in no acute distress Filed Vitals:   01/09/12 1004    BP: 113/68  Pulse: 83  Temp: 97.8 F (36.6 C)     Body mass index is 22.58 kg/(m^2).    ECOG FS: 1  Filed Weights   01/09/12 1004  Weight: 139 lb 14.4 oz (63.458 kg)   Physical Exam: HEENT:  Sclerae anicteric, conjunctivae pale.  Oropharynx clear.  No mucositis  or candidiasis.  Buccal mucosa is pink and moist. Nodes:  No cervical, supraclavicular, or axillary lymphadenopathy palpated.  Breast Exam:  Deferred Lungs:  Clear to auscultation bilaterally.  No crackles, rhonchi, or wheezes.   Heart:  Regular rate and rhythm.   Abdomen:  Soft, nontender.  Positive bowel sounds.  No organomegaly or masses palpated.   Musculoskeletal:  No focal spinal tenderness to palpation.  Extremities:  Benign.  No peripheral edema or cyanosis.   Skin:  Benign.   Neuro:  Nonfocal. Alert and oriented x3.     LAB RESULTS: CBC today is essentially unchanged as compared to her original visit here 2 weeks ago. Other labs of interest include negative BCR-ABL and JAK-2  Lab Results  Component Value Date   WBC 2.1* 01/09/2012   NEUTROABS 1.0* 01/09/2012   HGB 8.3* 01/09/2012   HCT 23.9* 01/09/2012   MCV 113.5* 01/09/2012   PLT 1,051* 01/09/2012      Chemistry      Component Value Date/Time   NA 142 11/30/2011 1034      Component Value Date/Time   CALCIUM 9.0 11/30/2011 1034       STUDIES: Bone marrow biopsy shows a hypercellular marrow with approximately 70% erythroid predominance. There is significant dysplasia in the red cell series. There are ring sideroblasts. There is an increased number of blasts, up to 10-15% of all cells. This is confirmed by flow cytometry which shows 22% of all cells caring and ambiguous phenotype (CD13, CD33, CD34, and CD3 positive.). Myeloma peroxidase is negative. There is also a striking megakaryocytic proliferation. Cytogenetics show a t(3,3) translocation, but no 5, 7, or 8 abnormalities.  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  (1) currently being treated with azacitidine (Vidaza), given subcutaneously x7 doses (Monday through Friday, then Monday and Tuesday of the following week) every 28 days.  PLAN:  This case was reviewed with Dr. Darnelle Catalan who also spoke extensively with the patient today regarding her plan of care. In fact over half of our 50 minute appointment today was spent reviewing her treatment plan, possible side effects, and coordinating care.  Teaching was given regarding what to expect during this treatment regimen, and the patient was given a prescription for ondansetron along with instructions in writing on how to hopefully prevent nausea.  Taimane will return next week on June 24 for her first subcutaneous injection of azacitidine (Vidaza).  She will receive injections daily x5 (June 24 through June 28), skip the weekend, then daily on Monday and Tuesday (July 1 and second). She'll be seeing Dr. Darnelle Catalan for brief followup with injection on July 1. We'll repeat labs only on July 10. She return for her second 28 day cycle on July 22.  The plan is to complete 3 months of the azacitidine as described above, then repeat her bone marrow biopsy. She'll discuss scheduling this biopsy when she sees Dr. Darnelle Catalan on July 1. Roselene voices understanding and agreement with our plan, and will call with any changes or problems.   Ruddy Swire    01/09/2012

## 2012-01-14 ENCOUNTER — Ambulatory Visit (HOSPITAL_BASED_OUTPATIENT_CLINIC_OR_DEPARTMENT_OTHER): Payer: Medicare Other

## 2012-01-14 VITALS — BP 153/68 | HR 78 | Temp 97.8°F

## 2012-01-14 DIAGNOSIS — D75839 Thrombocytosis, unspecified: Secondary | ICD-10-CM

## 2012-01-14 DIAGNOSIS — D473 Essential (hemorrhagic) thrombocythemia: Secondary | ICD-10-CM

## 2012-01-14 DIAGNOSIS — D72819 Decreased white blood cell count, unspecified: Secondary | ICD-10-CM

## 2012-01-14 DIAGNOSIS — Z5111 Encounter for antineoplastic chemotherapy: Secondary | ICD-10-CM

## 2012-01-14 DIAGNOSIS — C9400 Acute erythroid leukemia, not having achieved remission: Secondary | ICD-10-CM

## 2012-01-14 DIAGNOSIS — I629 Nontraumatic intracranial hemorrhage, unspecified: Secondary | ICD-10-CM

## 2012-01-14 DIAGNOSIS — D649 Anemia, unspecified: Secondary | ICD-10-CM

## 2012-01-14 MED ORDER — ONDANSETRON HCL 8 MG PO TABS
8.0000 mg | ORAL_TABLET | Freq: Once | ORAL | Status: AC
  Administered 2012-01-14: 8 mg via ORAL

## 2012-01-14 MED ORDER — AZACITIDINE CHEMO SQ INJECTION
75.0000 mg/m2 | Freq: Once | INTRAMUSCULAR | Status: AC
  Administered 2012-01-14: 130 mg via SUBCUTANEOUS
  Filled 2012-01-14: qty 26

## 2012-01-15 ENCOUNTER — Ambulatory Visit (HOSPITAL_BASED_OUTPATIENT_CLINIC_OR_DEPARTMENT_OTHER): Payer: Medicare Other

## 2012-01-15 ENCOUNTER — Other Ambulatory Visit: Payer: Medicare Other

## 2012-01-15 ENCOUNTER — Ambulatory Visit: Payer: Medicare Other | Admitting: Oncology

## 2012-01-15 ENCOUNTER — Inpatient Hospital Stay: Payer: Medicare Other

## 2012-01-15 ENCOUNTER — Other Ambulatory Visit: Payer: Medicare Other | Admitting: Lab

## 2012-01-15 VITALS — BP 125/61 | HR 67 | Temp 97.1°F

## 2012-01-15 DIAGNOSIS — D473 Essential (hemorrhagic) thrombocythemia: Secondary | ICD-10-CM

## 2012-01-15 DIAGNOSIS — D72819 Decreased white blood cell count, unspecified: Secondary | ICD-10-CM

## 2012-01-15 DIAGNOSIS — I629 Nontraumatic intracranial hemorrhage, unspecified: Secondary | ICD-10-CM

## 2012-01-15 DIAGNOSIS — Z5111 Encounter for antineoplastic chemotherapy: Secondary | ICD-10-CM

## 2012-01-15 DIAGNOSIS — D649 Anemia, unspecified: Secondary | ICD-10-CM

## 2012-01-15 DIAGNOSIS — C9400 Acute erythroid leukemia, not having achieved remission: Secondary | ICD-10-CM

## 2012-01-15 MED ORDER — AZACITIDINE CHEMO SQ INJECTION
75.0000 mg/m2 | Freq: Once | INTRAMUSCULAR | Status: AC
  Administered 2012-01-15: 130 mg via SUBCUTANEOUS
  Filled 2012-01-15: qty 26

## 2012-01-15 MED ORDER — ONDANSETRON HCL 8 MG PO TABS
8.0000 mg | ORAL_TABLET | Freq: Once | ORAL | Status: AC
  Administered 2012-01-15: 8 mg via ORAL

## 2012-01-16 ENCOUNTER — Ambulatory Visit (HOSPITAL_BASED_OUTPATIENT_CLINIC_OR_DEPARTMENT_OTHER): Payer: Medicare Other

## 2012-01-16 VITALS — BP 104/55 | HR 72 | Temp 97.5°F

## 2012-01-16 DIAGNOSIS — D473 Essential (hemorrhagic) thrombocythemia: Secondary | ICD-10-CM

## 2012-01-16 DIAGNOSIS — Z5111 Encounter for antineoplastic chemotherapy: Secondary | ICD-10-CM

## 2012-01-16 DIAGNOSIS — D649 Anemia, unspecified: Secondary | ICD-10-CM

## 2012-01-16 DIAGNOSIS — C9400 Acute erythroid leukemia, not having achieved remission: Secondary | ICD-10-CM

## 2012-01-16 DIAGNOSIS — D72819 Decreased white blood cell count, unspecified: Secondary | ICD-10-CM

## 2012-01-16 DIAGNOSIS — I629 Nontraumatic intracranial hemorrhage, unspecified: Secondary | ICD-10-CM

## 2012-01-16 MED ORDER — ONDANSETRON HCL 8 MG PO TABS
8.0000 mg | ORAL_TABLET | Freq: Once | ORAL | Status: AC
  Administered 2012-01-16: 8 mg via ORAL

## 2012-01-16 MED ORDER — AZACITIDINE CHEMO SQ INJECTION
75.0000 mg/m2 | Freq: Once | INTRAMUSCULAR | Status: AC
  Administered 2012-01-16: 130 mg via SUBCUTANEOUS
  Filled 2012-01-16: qty 26

## 2012-01-16 NOTE — Patient Instructions (Signed)
Call MD for problems 

## 2012-01-17 ENCOUNTER — Ambulatory Visit (HOSPITAL_BASED_OUTPATIENT_CLINIC_OR_DEPARTMENT_OTHER): Payer: Medicare Other

## 2012-01-17 VITALS — BP 126/59 | HR 89 | Temp 98.9°F

## 2012-01-17 DIAGNOSIS — Z5111 Encounter for antineoplastic chemotherapy: Secondary | ICD-10-CM

## 2012-01-17 DIAGNOSIS — D72819 Decreased white blood cell count, unspecified: Secondary | ICD-10-CM

## 2012-01-17 DIAGNOSIS — D649 Anemia, unspecified: Secondary | ICD-10-CM

## 2012-01-17 DIAGNOSIS — C9400 Acute erythroid leukemia, not having achieved remission: Secondary | ICD-10-CM

## 2012-01-17 DIAGNOSIS — I629 Nontraumatic intracranial hemorrhage, unspecified: Secondary | ICD-10-CM

## 2012-01-17 DIAGNOSIS — D473 Essential (hemorrhagic) thrombocythemia: Secondary | ICD-10-CM

## 2012-01-17 MED ORDER — AZACITIDINE CHEMO SQ INJECTION
75.0000 mg/m2 | Freq: Once | INTRAMUSCULAR | Status: AC
  Administered 2012-01-17: 130 mg via SUBCUTANEOUS
  Filled 2012-01-17: qty 26

## 2012-01-17 MED ORDER — ONDANSETRON HCL 8 MG PO TABS
8.0000 mg | ORAL_TABLET | Freq: Once | ORAL | Status: AC
  Administered 2012-01-17: 8 mg via ORAL

## 2012-01-17 NOTE — Patient Instructions (Addendum)
Call MD for problems 

## 2012-01-18 ENCOUNTER — Ambulatory Visit (HOSPITAL_BASED_OUTPATIENT_CLINIC_OR_DEPARTMENT_OTHER): Payer: Medicare Other

## 2012-01-18 VITALS — BP 132/56 | HR 94 | Temp 98.5°F

## 2012-01-18 DIAGNOSIS — D649 Anemia, unspecified: Secondary | ICD-10-CM

## 2012-01-18 DIAGNOSIS — C9400 Acute erythroid leukemia, not having achieved remission: Secondary | ICD-10-CM

## 2012-01-18 DIAGNOSIS — I629 Nontraumatic intracranial hemorrhage, unspecified: Secondary | ICD-10-CM

## 2012-01-18 DIAGNOSIS — Z5111 Encounter for antineoplastic chemotherapy: Secondary | ICD-10-CM

## 2012-01-18 DIAGNOSIS — D473 Essential (hemorrhagic) thrombocythemia: Secondary | ICD-10-CM

## 2012-01-18 DIAGNOSIS — D72819 Decreased white blood cell count, unspecified: Secondary | ICD-10-CM

## 2012-01-18 MED ORDER — ONDANSETRON HCL 8 MG PO TABS
8.0000 mg | ORAL_TABLET | Freq: Once | ORAL | Status: AC
  Administered 2012-01-18: 8 mg via ORAL

## 2012-01-18 MED ORDER — AZACITIDINE CHEMO SQ INJECTION
75.0000 mg/m2 | Freq: Once | INTRAMUSCULAR | Status: AC
  Administered 2012-01-18: 130 mg via SUBCUTANEOUS
  Filled 2012-01-18: qty 26

## 2012-01-18 NOTE — Patient Instructions (Signed)
Call MD for problems 

## 2012-01-21 ENCOUNTER — Ambulatory Visit (HOSPITAL_BASED_OUTPATIENT_CLINIC_OR_DEPARTMENT_OTHER): Payer: Medicare Other | Admitting: Oncology

## 2012-01-21 ENCOUNTER — Telehealth: Payer: Self-pay | Admitting: *Deleted

## 2012-01-21 ENCOUNTER — Inpatient Hospital Stay: Payer: Medicare Other

## 2012-01-21 ENCOUNTER — Other Ambulatory Visit: Payer: Medicare Other | Admitting: Lab

## 2012-01-21 VITALS — BP 131/65 | HR 90 | Temp 97.6°F | Ht 66.0 in | Wt 142.9 lb

## 2012-01-21 DIAGNOSIS — D72819 Decreased white blood cell count, unspecified: Secondary | ICD-10-CM

## 2012-01-21 DIAGNOSIS — I629 Nontraumatic intracranial hemorrhage, unspecified: Secondary | ICD-10-CM

## 2012-01-21 DIAGNOSIS — C9400 Acute erythroid leukemia, not having achieved remission: Secondary | ICD-10-CM

## 2012-01-21 DIAGNOSIS — D473 Essential (hemorrhagic) thrombocythemia: Secondary | ICD-10-CM

## 2012-01-21 DIAGNOSIS — Z5111 Encounter for antineoplastic chemotherapy: Secondary | ICD-10-CM

## 2012-01-21 DIAGNOSIS — D649 Anemia, unspecified: Secondary | ICD-10-CM

## 2012-01-21 LAB — CBC WITH DIFFERENTIAL/PLATELET
Basophils Absolute: 0 10*3/uL (ref 0.0–0.1)
Eosinophils Absolute: 0 10*3/uL (ref 0.0–0.5)
HCT: 22.5 % — ABNORMAL LOW (ref 34.8–46.6)
HGB: 7.9 g/dL — ABNORMAL LOW (ref 11.6–15.9)
LYMPH%: 57.2 % — ABNORMAL HIGH (ref 14.0–49.7)
MONO#: 0.1 10*3/uL (ref 0.1–0.9)
NEUT#: 0.8 10*3/uL — ABNORMAL LOW (ref 1.5–6.5)
NEUT%: 37.2 % — ABNORMAL LOW (ref 38.4–76.8)
Platelets: 948 10*3/uL — ABNORMAL HIGH (ref 145–400)
RBC: 1.99 10*6/uL — ABNORMAL LOW (ref 3.70–5.45)
WBC: 2.2 10*3/uL — ABNORMAL LOW (ref 3.9–10.3)

## 2012-01-21 MED ORDER — AZACITIDINE CHEMO SQ INJECTION
75.0000 mg/m2 | Freq: Once | INTRAMUSCULAR | Status: AC
  Administered 2012-01-21: 130 mg via SUBCUTANEOUS
  Filled 2012-01-21: qty 26

## 2012-01-21 MED ORDER — ONDANSETRON HCL 8 MG PO TABS
8.0000 mg | ORAL_TABLET | Freq: Once | ORAL | Status: AC
  Administered 2012-01-21: 8 mg via ORAL

## 2012-01-21 NOTE — Telephone Encounter (Signed)
Gave patient new date and time verbally did not want to wait on a calendar moved appointment to 01-30-2012 starting at 8:00am per orders from 01-21-2012

## 2012-01-21 NOTE — Progress Notes (Signed)
ID: [name]   DOB: 12-02-1928  MR#: [id number]  CSN#:[id number]  HISTORY OF PRESENT ILLNESS: The patient sees Dr. [name] for routine health maintenance. On March 2013 he noted that the patient's hemoglobin was 8.7 (previously it had been 11.1). The MCV was at 107.1. The platelet count was 660,000. The white cell count was 4.2. He obtained a B12 and folate which were within normal limits at 1744 and 8.4 respectively.[name] He checked a ferritin to evaluate the thrombocytosis, and this was 151. Serum protein electrophoresis did not show an M spike and the urine protein electrophoresis was likewise negative. He referred the patient for further evaluation. 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 Today is day 6 of cycle 1 of her subcutaneous azacytidine treatment  REVIEW OF SYSTEMS: She is tolerating the therapy remarkably well. She has had no skin reaction, no fever, no bleeding, and also no significant symptoms of anemia other than mild fatigue, which is not worse than her baseline. She denies shortness of breath, palpitations, dizziness, or other symptoms. In fact her review of systems today is entirely stable.  PAST MEDICAL HISTORY: Past Medical History  Diagnosis Date  . Pacemaker     left shoulder  . Cataract     had surgery  . GERD (gastroesophageal reflux disease)   . Glaucoma   . Hyperlipidemia   . Osteoporosis   . Thyroid disease   . PUD (peptic ulcer disease)   . Lichen sclerosus et atrophicus of the vulva   . Intracranial hemorrhage     PAST SURGICAL HISTORY: Past Surgical History  Procedure Date  . Cataract extraction w/ intraocular lens  implant, bilateral   . Dilation and curettage of uterus   . Tonsillectomy and adenoidectomy   . Pacemaker insertion     left shoulder  . Subdural hematoma evacuation via craniotomy 25-Dec-2003    after a fall  . Colonoscopy     FAMILY  HISTORY Family History  Problem Relation Age of Onset  . Colon cancer Mother    the patient's father died at the age of 64 from a myocardial infarction. Her mother was diagnosed with colon cancer at the age of 75 and died from that disease within a year. The patient had 2 sisters. One died at the age of 50 with a neck fracture. The patient has 2 brothers. There is no other history of cancer or hematologic problems in the family to her knowledge  GYNECOLOGIC HISTORY: She does not recall when she had menarche. She is GX P1, first live birth age 58. She went to the change of life in her 85s. She never took hormone replacement.  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 Her husband died in 1995/12/25 from a heart attack. The patient lives alone, with no pets. Her daughter, [name] lives in [place]. She has a PhD in Water quality scientist and works for [employer]. Her phone number is [phone].The patient's brother [name] lives next door and in case of an emergency she will like him call first. His phone number is [phone]   ADVANCED DIRECTIVES: in place  HEALTH MAINTENANCE: History  Substance Use Topics  . Smoking status: Former Games developer  . Smokeless tobacco: Never Used  . Alcohol Use: No     Colonoscopy: [facility]/ 2010/12/25 PAP: "no longer"  Bone density: osteoporosis  Lipid panel: [name]             Mammogram: Nov 2012  Allergies  Allergen Reactions  . Sulfa Antibiotics     Pt. States she may have had a reaction a long time ago    Current Outpatient Prescriptions  Medication Sig Dispense Refill  . alendronate (FOSAMAX) 70 MG tablet Take 70 mg by mouth every 7 (seven) days. Take with a full glass of water on an empty stomach. Takes on Saturdays      . aspirin 325 MG EC tablet Take 325 mg by mouth daily.       . bimatoprost (LUMIGAN) 0.03 % ophthalmic solution Place 1 drop into both eyes at bedtime.         . brinzolamide (AZOPT) 1 % ophthalmic suspension Place 1 drop into both eyes 2 (two) times daily.        . Calcium Carbonate-Vitamin D (CALTRATE 600+D PO) Take 1 tablet by mouth 2 (two) times daily with breakfast and lunch.        . Cholecalciferol (VITAMIN D) 2000 UNITS tablet Take 2,000 Units by mouth 2 (two) times daily.        . Cyanocobalamin (VITAMIN B 12 PO) Take 2,000 mcg by mouth daily.      . folic acid (FOLVITE) 400 MCG tablet Take 400 mcg by mouth daily.      [name] levothyroxine (SYNTHROID, LEVOTHROID) 88 MCG tablet Take 88 mcg by mouth daily.      . Multiple Vitamins-Minerals (MACULAR VITAMIN BENEFIT PO) Take 2 tablets by mouth daily.      [name] nystatin cream (MYCOSTATIN) Apply 1 application topically as needed.       . OMEGA-3 KRILL OIL 300 MG CAPS Take 1 capsule by mouth daily.        [name] omeprazole (PRILOSEC) 20 MG capsule Take 20 mg by mouth daily.        . ondansetron (ZOFRAN) 8 MG tablet Take 1 tablet (8 mg total) by mouth 2 (two) times daily as needed (Nausea or vomiting).  30 tablet  1  . pravastatin (PRAVACHOL) 40 MG tablet Take 40 mg by mouth daily.        . Zinc-Magnesium Aspart-Vit B6 (ZINC MAGNESIUM ASPARTATE PO) Take 1 tablet by mouth daily.         Current Facility-Administered Medications  Medication Dose Route Frequency Provider Last Rate Last Dose  . azaCITIDine (VIDAZA) chemo injection 130 mg  75 mg/m2 (Treatment Plan Actual) Subcutaneous Once Lowella Dell, MD      . ondansetron [facility]) tablet 8 mg  8 mg Oral Once Lowella Dell, MD        OBJECTIVE: Elderly white woman who looks remarkably well Filed Vitals:   01/21/12 1422  BP: 131/65  Pulse: 90  Temp: 97.6 F (36.4 C)     Body mass index is 23.06 kg/(m^2).    ECOG FS: 1  Sclerae unicteric Oropharynx clear No peripheral adenopathy Lungs no rales or rhonchi Heart regular rate and rhythm Abd benign, no splenomegaly by exam MSK no focal spinal tenderness, no peripheral edema Neuro: nonfocal  LAB  RESULTS: CBC today is essentially unchanged as compared to her original visit here 2 weeks ago. Other labs of interest include negative BCR-ABL and JAK-2  Lab Results  Component Value Date   WBC 2.2* 01/21/2012   NEUTROABS 0.8* 01/21/2012   HGB 7.9* 01/21/2012   HCT 22.5* 01/21/2012   MCV 113.4* 01/21/2012   PLT  948* 01/21/2012      Chemistry      Component Value Date/Time   NA 142 11/30/2011 1034      Component Value Date/Time   CALCIUM 9.0 11/30/2011 1034       No results found for this basename: LABCA2    No components found with this basename: LABCA125    No results found for this basename: INR:1;PROTIME:1 in the last 168 hours  Urinalysis No results found for this basename: colorurine,  appearanceur,  labspec,  phurine,  glucoseu,  hgbur,  bilirubinur,  ketonesur,  proteinur,  urobilinogen,  nitrite,  leukocytesur    STUDIES: Bone marrow biopsy shows a hypercellular marrow with approximately 70% erythroid predominance. There is significant dysplasia in the red cell series. There are ring sideroblasts. There is an increased number of blasts, up to 10-15% of all cells. This is confirmed by flow cytometry which shows 22% of all cells caring and ambiguous phenotype (CD13, CD33, CD34, and CD3 positive.). Myeloma peroxidase is negative, as are JAK-2 and bcr/abl. There is a striking megakaryocytic proliferation. Cytogenetics show a t(3,3) translocation, but no 5, 7, or 8 abnormalities.  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   (1) started azacytidine 01/14/2012  PLAN: She tells me her daughter has been trying to contact me, and we tried to call her back during the visit but she did not answer the  phone, so I left her my beeper number. [name] is tolerating treatment quite well so far. She is due for pacemaker change, and I think I would proceed with that, since we are not likely to know for several months which course of [name] hematologic problems will take. Today I reminded her that she does need to call us if she develops a temperature above 100, in which case likely she will need admission for intravenous antibiotics. [name] will see me again July 10, and we will consider transfusion at that time if she has developed any symptoms. I do not feel comfortable using erythropoietin given the nature of her leukemia.   [name] C    01/21/2012

## 2012-01-22 ENCOUNTER — Other Ambulatory Visit: Payer: Self-pay | Admitting: Certified Registered Nurse Anesthetist

## 2012-01-22 ENCOUNTER — Ambulatory Visit (HOSPITAL_BASED_OUTPATIENT_CLINIC_OR_DEPARTMENT_OTHER): Payer: Medicare Other

## 2012-01-22 VITALS — BP 143/69 | HR 90 | Temp 97.2°F

## 2012-01-22 DIAGNOSIS — Z5111 Encounter for antineoplastic chemotherapy: Secondary | ICD-10-CM

## 2012-01-22 DIAGNOSIS — D72819 Decreased white blood cell count, unspecified: Secondary | ICD-10-CM

## 2012-01-22 DIAGNOSIS — C9400 Acute erythroid leukemia, not having achieved remission: Secondary | ICD-10-CM

## 2012-01-22 DIAGNOSIS — D649 Anemia, unspecified: Secondary | ICD-10-CM

## 2012-01-22 DIAGNOSIS — D473 Essential (hemorrhagic) thrombocythemia: Secondary | ICD-10-CM

## 2012-01-22 DIAGNOSIS — I629 Nontraumatic intracranial hemorrhage, unspecified: Secondary | ICD-10-CM

## 2012-01-22 MED ORDER — AZACITIDINE CHEMO SQ INJECTION
75.0000 mg/m2 | Freq: Once | INTRAMUSCULAR | Status: AC
  Administered 2012-01-22: 130 mg via SUBCUTANEOUS
  Filled 2012-01-22: qty 26

## 2012-01-22 MED ORDER — ONDANSETRON HCL 8 MG PO TABS
8.0000 mg | ORAL_TABLET | Freq: Once | ORAL | Status: AC
  Administered 2012-01-22: 8 mg via ORAL

## 2012-01-22 NOTE — Patient Instructions (Signed)
Call MD for problems 

## 2012-01-28 ENCOUNTER — Telehealth: Payer: Self-pay | Admitting: *Deleted

## 2012-01-28 NOTE — Telephone Encounter (Signed)
This RN called to path dept and spoke with Cisca per request for BMBX slides ( 12/11/2011) to be Fed Exed to:  Dr Egbert Garibaldi  C/O [facility] [address] [place] [address]  [phone]

## 2012-01-29 ENCOUNTER — Encounter (HOSPITAL_COMMUNITY): Payer: Self-pay | Admitting: Pharmacy Technician

## 2012-01-30 ENCOUNTER — Ambulatory Visit (HOSPITAL_BASED_OUTPATIENT_CLINIC_OR_DEPARTMENT_OTHER): Payer: Medicare Other | Admitting: Oncology

## 2012-01-30 ENCOUNTER — Other Ambulatory Visit: Payer: Self-pay | Admitting: Oncology

## 2012-01-30 ENCOUNTER — Other Ambulatory Visit (HOSPITAL_BASED_OUTPATIENT_CLINIC_OR_DEPARTMENT_OTHER): Payer: Medicare Other | Admitting: Lab

## 2012-01-30 ENCOUNTER — Telehealth: Payer: Self-pay | Admitting: Oncology

## 2012-01-30 ENCOUNTER — Other Ambulatory Visit: Payer: Medicare Other | Admitting: Lab

## 2012-01-30 VITALS — BP 128/71 | HR 94 | Temp 98.4°F | Ht 66.0 in | Wt 139.8 lb

## 2012-01-30 DIAGNOSIS — C9400 Acute erythroid leukemia, not having achieved remission: Secondary | ICD-10-CM

## 2012-01-30 DIAGNOSIS — D709 Neutropenia, unspecified: Secondary | ICD-10-CM

## 2012-01-30 LAB — CBC WITH DIFFERENTIAL/PLATELET
BASO%: 0.3 % (ref 0.0–2.0)
HCT: 21.5 % — ABNORMAL LOW (ref 34.8–46.6)
LYMPH%: 66.1 % — ABNORMAL HIGH (ref 14.0–49.7)
MCHC: 34.8 g/dL (ref 31.5–36.0)
MONO#: 0 10*3/uL — ABNORMAL LOW (ref 0.1–0.9)
NEUT%: 29.1 % — ABNORMAL LOW (ref 38.4–76.8)
Platelets: 539 10*3/uL — ABNORMAL HIGH (ref 145–400)
WBC: 1.6 10*3/uL — ABNORMAL LOW (ref 3.9–10.3)

## 2012-01-30 NOTE — Telephone Encounter (Signed)
gve the pt her July 2013 appt calendar °

## 2012-01-30 NOTE — Progress Notes (Signed)
ID: [name]   DOB: 07/21/1929  MR#: [id number]  QMV#:[id number]  HISTORY OF PRESENT ILLNESS: The patient sees Dr. [name] for routine health maintenance. On March 2013 he noted that the patient's hemoglobin was 8.7 (previously it had been 11.1). The MCV was at 107.1. The platelet count was 660,000. The white cell count was 4.2. He obtained a B12 and folate which were within normal limits at 1744 and 8.4 respectively.[name] He checked a ferritin to evaluate the thrombocytosis, and this was 151. Serum protein electrophoresis did not show an M spike and the urine protein electrophoresis was likewise negative. He referred the patient for further evaluation. 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 Today is day 22 of cycle 1 of her subcutaneous azacytidine treatment  REVIEW OF SYSTEMS: She continues to do remarkably well and in particular she gives me no significant symptoms regarding her anemia. She used to be able to walk 40 minutes but now she walks 20. Her legs feel tired at 20. She doesn't have shortness of breath, palpitations, dizziness, or other symptoms. She's also had no fever, pain, rash, bleeding, or any signs of infection. In fact a detailed review of systems is entirely negative today.  PAST MEDICAL HISTORY: Past Medical History  Diagnosis Date  . Pacemaker     left shoulder  . Cataract     had surgery  . GERD (gastroesophageal reflux disease)   . Glaucoma   . Hyperlipidemia   . Osteoporosis   . Thyroid disease   . PUD (peptic ulcer disease)   . Lichen sclerosus et atrophicus of the vulva   . Intracranial hemorrhage     PAST SURGICAL HISTORY: Past Surgical History  Procedure Date  . Cataract extraction w/ intraocular lens  implant, bilateral   . Dilation and curettage of uterus   . Tonsillectomy and adenoidectomy   . Pacemaker insertion     left shoulder  . Subdural hematoma  evacuation via craniotomy 2004-01-03    after a fall  . Colonoscopy     FAMILY HISTORY Family History  Problem Relation Age of Onset  . Colon cancer Mother    the patient's father died at the age of 30 from a myocardial infarction. Her mother was diagnosed with colon cancer at the age of 3 and died from that disease within a year. The patient had 2 sisters. One died at the age of 84 with a neck fracture. The patient has 2 brothers. There is no other history of cancer or hematologic problems in the family to her knowledge  GYNECOLOGIC HISTORY: She does not recall when she had menarche. She is GX P1, first live birth age 61. She went to the change of life in her 15s. She never took hormone replacement.  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 Her husband died in 01/03/96 from a heart attack. The patient lives alone, with no pets. Her daughter, [name] lives in [place]. She has a PhD in Water quality scientist and works for [employer]. Her phone number is [phone].The patient's brother [name] lives next door and in case of an emergency she will like him call first. His phone number is [phone]   ADVANCED DIRECTIVES: in place  HEALTH MAINTENANCE: History  Substance Use Topics  . Smoking status: Former Games developer  . Smokeless tobacco: Never Used  . Alcohol  Use: No     Colonoscopy: [facility]/ May 2012  PAP: "no longer"  Bone density: osteoporosis   Lipid panel: [name]             Mammogram: Nov 2012  Allergies  Allergen Reactions  . Sulfa Antibiotics     Pt. States she may have had a reaction a long time ago    Current Outpatient Prescriptions  Medication Sig Dispense Refill  . alendronate (FOSAMAX) 70 MG tablet Take 70 mg by mouth every 7 (seven) days. Take with a full glass of water on an empty stomach. Takes on Saturdays      . aspirin 325 MG EC tablet Take 325 mg by mouth daily.       . bimatoprost  (LUMIGAN) 0.03 % ophthalmic solution Place 1 drop into both eyes at bedtime.        . brinzolamide (AZOPT) 1 % ophthalmic suspension Place 1 drop into both eyes 2 (two) times daily.        . Calcium Carbonate-Vitamin D (CALTRATE 600+D PO) Take 1 tablet by mouth 2 (two) times daily with breakfast and lunch.        . Cholecalciferol (VITAMIN D) 2000 UNITS tablet Take 2,000 Units by mouth 2 (two) times daily.        . Cyanocobalamin (VITAMIN B 12 PO) Take 2,000 mcg by mouth daily.      . folic acid (FOLVITE) 400 MCG tablet Take 400 mcg by mouth daily.      [name] levothyroxine (SYNTHROID, LEVOTHROID) 88 MCG tablet Take 88 mcg by mouth daily.      . Multiple Vitamins-Minerals (MACULAR VITAMIN BENEFIT PO) Take 2 tablets by mouth daily.      [name] nystatin cream (MYCOSTATIN) Apply 1 application topically as needed.       . OMEGA-3 KRILL OIL 300 MG CAPS Take 1 capsule by mouth daily.        [name] omeprazole (PRILOSEC) 20 MG capsule Take 20 mg by mouth daily.        . ondansetron (ZOFRAN) 8 MG tablet Take 1 tablet (8 mg total) by mouth 2 (two) times daily as needed (Nausea or vomiting).  30 tablet  1  . pravastatin (PRAVACHOL) 40 MG tablet Take 40 mg by mouth daily.        . Zinc-Magnesium Aspart-Vit B6 (ZINC MAGNESIUM ASPARTATE PO) Take 1 tablet by mouth daily.          OBJECTIVE: Elderly white woman who looks remarkably well Filed Vitals:   01/30/12 0830  BP: 128/71  Pulse: 94  Temp: 98.4 F (36.9 C)     Body mass index is 22.56 kg/(m^2).    ECOG FS: 1  Sclerae unicteric Oropharynx clear No peripheral adenopathy Lungs no rales or rhonchi Heart regular rate and rhythm Abd benign, no splenomegaly by exam MSK no focal spinal tenderness, no peripheral edema Neuro: nonfocal  LAB RESULTS:   Lab Results  Component Value Date   WBC 1.6* 01/30/2012   NEUTROABS 0.5* 01/30/2012   HGB 7.5* 01/30/2012   HCT 21.5* 01/30/2012   MCV 113.1* 01/30/2012   PLT 539* 01/30/2012      Chemistry      Component Value  Date/Time   NA 142 11/30/2011 1034      Component Value Date/Time   CALCIUM 9.0 11/30/2011 1034       No results found for this basename: LABCA2    No components found with this basename: [id number]  No results found for this basename: INR:1;PROTIME:1 in the last 168 hours  Urinalysis No results found for this basename: colorurine,  appearanceur,  labspec,  phurine,  glucoseu,  hgbur,  bilirubinur,  ketonesur,  proteinur,  urobilinogen,  nitrite,  leukocytesur    STUDIES: Bone marrow biopsy shows a hypercellular marrow with approximately 70% erythroid predominance. There is significant dysplasia in the red cell series. There are ring sideroblasts. There is an increased number of blasts, up to 10-15% of all cells. This is confirmed by flow cytometry which shows 22% of all cells caring and ambiguous phenotype (CD13, CD33, CD34, and CD3 positive.). Myeloma peroxidase is negative, as are JAK-2 and bcr/abl. There is a striking megakaryocytic proliferation. Cytogenetics show a t(3,3) translocation, but no 5, 7, or 8 abnormalities.  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   (1) started azacytidine 01/14/2012  PLAN: She continues to tolerate treatment remarkably well. Her hemoglobin is down to 7.5. I am going to recheck it July 15 and I will anticipate possible transfusion that day depending on whether she is having symptoms of bladder numbers are. In addition she is scheduled for pacemaker replacement on the 17th and her cardiologist may wish for her to have a higher hemoglobin for that procedure.  She is also now officially neutropenic. We again discussed neutropenic precautions. She has still has not  gotten herself a thermometer, but she will get one today and she will call if she has a temperature of greater than 100.  We called her daughter, Dr. Otis Brace, because the patient thought about she had some questions regarding her bone marrow, but actually she did daughter just wanted to make sure the slides were sent to Dr. Midge Aver in [place] and I believe that has been done.  Lisel will call for any problems that may develop before her next visit  Mason Burleigh C    01/30/2012

## 2012-01-31 ENCOUNTER — Encounter (HOSPITAL_COMMUNITY)
Admission: RE | Admit: 2012-01-31 | Discharge: 2012-01-31 | Disposition: A | Discharge status: Home / Self Care | Payer: Medicare Other | Source: Ambulatory Visit | Attending: Oncology | Admitting: Oncology

## 2012-01-31 ENCOUNTER — Ambulatory Visit: Payer: Medicare Other | Admitting: Oncology

## 2012-01-31 DIAGNOSIS — D469 Myelodysplastic syndrome, unspecified: Secondary | ICD-10-CM

## 2012-01-31 DIAGNOSIS — C9400 Acute erythroid leukemia, not having achieved remission: Secondary | ICD-10-CM

## 2012-02-01 ENCOUNTER — Other Ambulatory Visit: Payer: Self-pay | Admitting: Cardiovascular Disease

## 2012-02-04 ENCOUNTER — Other Ambulatory Visit: Payer: Self-pay | Admitting: Oncology

## 2012-02-04 ENCOUNTER — Ambulatory Visit (HOSPITAL_BASED_OUTPATIENT_CLINIC_OR_DEPARTMENT_OTHER): Payer: Medicare Other

## 2012-02-04 ENCOUNTER — Other Ambulatory Visit (HOSPITAL_BASED_OUTPATIENT_CLINIC_OR_DEPARTMENT_OTHER): Payer: Medicare Other | Admitting: Lab

## 2012-02-04 VITALS — BP 139/62 | HR 67 | Temp 97.7°F | Resp 18

## 2012-02-04 DIAGNOSIS — D649 Anemia, unspecified: Secondary | ICD-10-CM

## 2012-02-04 DIAGNOSIS — C9400 Acute erythroid leukemia, not having achieved remission: Secondary | ICD-10-CM

## 2012-02-04 DIAGNOSIS — D469 Myelodysplastic syndrome, unspecified: Secondary | ICD-10-CM

## 2012-02-04 LAB — CBC WITH DIFFERENTIAL/PLATELET
Eosinophils Absolute: 0 10*3/uL (ref 0.0–0.5)
LYMPH%: 66.3 % — ABNORMAL HIGH (ref 14.0–49.7)
MCV: 113.9 fL — ABNORMAL HIGH (ref 79.5–101.0)
MONO%: 2.4 % (ref 0.0–14.0)
NEUT#: 0.4 10*3/uL — CL (ref 1.5–6.5)
Platelets: 623 10*3/uL — ABNORMAL HIGH (ref 145–400)
RBC: 1.87 10*6/uL — ABNORMAL LOW (ref 3.70–5.45)

## 2012-02-04 LAB — PREPARE RBC (CROSSMATCH)

## 2012-02-04 MED ORDER — DIPHENHYDRAMINE HCL 25 MG PO CAPS
25.0000 mg | ORAL_CAPSULE | Freq: Once | ORAL | Status: AC
  Administered 2012-02-04: 25 mg via ORAL

## 2012-02-04 MED ORDER — ACETAMINOPHEN 325 MG PO TABS
650.0000 mg | ORAL_TABLET | Freq: Once | ORAL | Status: AC
  Administered 2012-02-04: 650 mg via ORAL

## 2012-02-04 NOTE — Patient Instructions (Signed)
Blood Transfusion Information  WHAT IS A BLOOD TRANSFUSION?  A transfusion is the replacement of blood or some of its parts. Blood is made up of multiple cells which provide different functions.   Red blood cells carry oxygen and are used for blood loss replacement.   White blood cells fight against infection.   Platelets control bleeding.   Plasma helps clot blood.   Other blood products are available for specialized needs, such as hemophilia or other clotting disorders.  BEFORE THE TRANSFUSION   Who gives blood for transfusions?    You may be able to donate blood to be used at a later date on yourself (autologous donation).   Relatives can be asked to donate blood. This is generally not any safer than if you have received blood from a stranger. The same precautions are taken to ensure safety when a relative's blood is donated.   Healthy volunteers who are fully evaluated to make sure their blood is safe. This is blood bank blood.  Transfusion therapy is the safest it has ever been in the practice of medicine. Before blood is taken from a donor, a complete history is taken to make sure that person has no history of diseases nor engages in risky social behavior (examples are intravenous drug use or sexual activity with multiple partners). The donor's travel history is screened to minimize risk of transmitting infections, such as malaria. The donated blood is tested for signs of infectious diseases, such as HIV and hepatitis. The blood is then tested to be sure it is compatible with you in order to minimize the chance of a transfusion reaction. If you or a relative donates blood, this is often done in anticipation of surgery and is not appropriate for emergency situations. It takes many days to process the donated blood.  RISKS AND COMPLICATIONS  Although transfusion therapy is very safe and saves many lives, the main dangers of transfusion include:    Getting an infectious disease.   Developing a  transfusion reaction. This is an allergic reaction to something in the blood you were given. Every precaution is taken to prevent this.  The decision to have a blood transfusion has been considered carefully by your caregiver before blood is given. Blood is not given unless the benefits outweigh the risks.  AFTER THE TRANSFUSION   Right after receiving a blood transfusion, you will usually feel much better and more energetic. This is especially true if your red blood cells have gotten low (anemic). The transfusion raises the level of the red blood cells which carry oxygen, and this usually causes an energy increase.   The nurse administering the transfusion will monitor you carefully for complications.  HOME CARE INSTRUCTIONS   No special instructions are needed after a transfusion. You may find your energy is better. Speak with your caregiver about any limitations on activity for underlying diseases you may have.  SEEK MEDICAL CARE IF:    Your condition is not improving after your transfusion.   You develop redness or irritation at the intravenous (IV) site.  SEEK IMMEDIATE MEDICAL CARE IF:   Any of the following symptoms occur over the next 12 hours:   Shaking chills.   You have a temperature by mouth above 102 F (38.9 C), not controlled by medicine.   Chest, back, or muscle pain.   People around you feel you are not acting correctly or are confused.   Shortness of breath or difficulty breathing.   Dizziness and fainting.     You get a rash or develop hives.   You have a decrease in urine output.   Your urine turns a dark color or changes to pink, red, or brown.  Any of the following symptoms occur over the next 10 days:   You have a temperature by mouth above 102 F (38.9 C), not controlled by medicine.   Shortness of breath.   Weakness after normal activity.   The white part of the eye turns yellow (jaundice).   You have a decrease in the amount of urine or are urinating less often.   Your  urine turns a dark color or changes to pink, red, or brown.  Document Released: 07/06/2000 Document Revised: 06/28/2011 Document Reviewed: 02/23/2008  [employer] Patient Information 2012 [employer]

## 2012-02-05 LAB — TYPE AND SCREEN

## 2012-02-05 LAB — HOLD TUBE, BLOOD BANK

## 2012-02-05 MED ORDER — SODIUM CHLORIDE 0.9 % IR SOLN
80.0000 mg | Status: AC
  Filled 2012-02-05: qty 2

## 2012-02-05 MED ORDER — CEFAZOLIN SODIUM-DEXTROSE 2-3 GM-% IV SOLR
2.0000 g | INTRAVENOUS | Status: AC
  Filled 2012-02-05: qty 50

## 2012-02-06 ENCOUNTER — Ambulatory Visit (HOSPITAL_COMMUNITY)
Admission: RE | Admit: 2012-02-06 | Discharge: 2012-02-06 | Disposition: A | Discharge status: Home / Self Care | Payer: Medicare Other | Source: Ambulatory Visit | Attending: Cardiovascular Disease | Admitting: Cardiovascular Disease

## 2012-02-06 ENCOUNTER — Ambulatory Visit (HOSPITAL_COMMUNITY): Payer: Medicare Other

## 2012-02-06 ENCOUNTER — Encounter (HOSPITAL_COMMUNITY)
Admission: RE | Disposition: A | Discharge status: Home / Self Care | Payer: Self-pay | Source: Ambulatory Visit | Attending: Cardiovascular Disease

## 2012-02-06 DIAGNOSIS — Z45018 Encounter for adjustment and management of other part of cardiac pacemaker: Secondary | ICD-10-CM | POA: Insufficient documentation

## 2012-02-06 HISTORY — PX: PERMANENT PACEMAKER GENERATOR CHANGE: SHX6022

## 2012-02-06 LAB — CBC
Hemoglobin: 10.3 g/dL — ABNORMAL LOW (ref 12.0–15.0)
MCH: 35.5 pg — ABNORMAL HIGH (ref 26.0–34.0)
MCHC: 35.2 g/dL (ref 30.0–36.0)
MCV: 101 fL — ABNORMAL HIGH (ref 78.0–100.0)
RBC: 2.9 MIL/uL — ABNORMAL LOW (ref 3.87–5.11)

## 2012-02-06 LAB — BASIC METABOLIC PANEL
CO2: 27 mEq/L (ref 19–32)
Calcium: 9.1 mg/dL (ref 8.4–10.5)
Creatinine, Ser: 0.72 mg/dL (ref 0.50–1.10)
GFR calc non Af Amer: 78 mL/min — ABNORMAL LOW (ref >90)
Glucose, Bld: 92 mg/dL (ref 70–99)

## 2012-02-06 SURGERY — PERMANENT PACEMAKER GENERATOR CHANGE
Anesthesia: LOCAL

## 2012-02-06 MED ORDER — MUPIROCIN 2 % EX OINT
TOPICAL_OINTMENT | CUTANEOUS | Status: AC
  Filled 2012-02-06: qty 22

## 2012-02-06 MED ORDER — LIDOCAINE HCL (PF) 1 % IJ SOLN
INTRAMUSCULAR | Status: AC
  Filled 2012-02-06: qty 60

## 2012-02-06 MED ORDER — HEPARIN (PORCINE) IN NACL 2-0.9 UNIT/ML-% IJ SOLN
INTRAMUSCULAR | Status: AC
  Filled 2012-02-06: qty 1000

## 2012-02-06 MED ORDER — HYDROCODONE-ACETAMINOPHEN 5-325 MG PO TABS: 1.0000 | ORAL_TABLET | ORAL | Status: DC | PRN

## 2012-02-06 MED ORDER — SODIUM CHLORIDE 0.45 % IV SOLN
INTRAVENOUS | Status: DC
  Administered 2012-02-06: 11:00:00 via INTRAVENOUS

## 2012-02-06 MED ORDER — CHLORHEXIDINE GLUCONATE 4 % EX LIQD: 60.0000 mL | Freq: Once | CUTANEOUS | Status: DC

## 2012-02-06 MED ORDER — SODIUM CHLORIDE 0.9 % IJ SOLN: 3.0000 mL | INTRAMUSCULAR | Status: DC | PRN

## 2012-02-06 MED ORDER — ACETAMINOPHEN 325 MG PO TABS: 325.0000 mg | ORAL_TABLET | ORAL | Status: DC | PRN

## 2012-02-06 MED ORDER — ONDANSETRON HCL 4 MG/2ML IJ SOLN: 4.0000 mg | Freq: Four times a day (QID) | INTRAMUSCULAR | Status: DC | PRN

## 2012-02-06 MED ORDER — FENTANYL CITRATE 0.05 MG/ML IJ SOLN
INTRAMUSCULAR | Status: AC
  Filled 2012-02-06: qty 2

## 2012-02-06 MED ORDER — MUPIROCIN 2 % EX OINT
TOPICAL_OINTMENT | Freq: Two times a day (BID) | CUTANEOUS | Status: DC
  Administered 2012-02-06: 11:00:00 via NASAL

## 2012-02-06 MED ORDER — MIDAZOLAM HCL 2 MG/2ML IJ SOLN
INTRAMUSCULAR | Status: AC
  Filled 2012-02-06: qty 2

## 2012-02-06 NOTE — CV Procedure (Signed)
Sannes,Tiffanny J Female, 78 y.o., 1929/07/21  Location: [facility] Bed: NONE  MRN: [id number]  CSN: [id number]   Procedure report  Procedure performed:  1. Dual chamber pacemaker generator changeout  2. Light sedation  Reason for procedure:  1. Device generator at elective replacement interval  Procedure performed by:  Thurmon Fair, MD  Complications:  None  Estimated blood loss:  <5 mL  Medications administered during procedure:  Ancef 1 g intravenously, lidocaine 1% 30 mL locally, fentanyl 25 mcg intravenously, Versed 1 mg intravenously Device details:   [employer] model number [id number], serial number [id number] Right atrial lead (chronic) guidant [id number], serial [id number]  Right ventricular lead (chronic)  Guidant [id number],serial number [id number]  Explanted generator guidant [id number], serial number  [id number]  Procedure details:  After the risks and benefits of the procedure were discussed the patient provided informed consent. She was brought to the cardiac catheter lab in the fasting state. The patient was prepped and draped in usual sterile fashion. Local anesthesia with 1% lidocaine was administered to to the left infraclavicular area. A 5-6cm horizontal incision was made parallel with and 2-3 cm caudal to the left clavicle, in the area of an old scar. An older scar was seen closer to the left clavicle. Using minimal electrocautery and mostly sharp and blunt dissection the prepectoral pocket was opened carefully to avoid injury to thet loops of chronic leads. Extensive dissection was necessary. The device was explanted. The pocket was carefully inspected for hemostasis and flushed with copious amounts of antibiotic solution.  The leads were disconnected from the old generator. The new generator was immediately connected to the chronic leads, with appropriate pacing noted.  Telemetry testing of the lead parameters showed excellent values  The entire system was then  carefully inserted in the pocket with care been taking that the leads and device assumed a comfortable position without pressure on the incision. Great care was taken that the leads be located deep to the generator. The pocket was then closed in layers using 2 layers of 2-0 Vicryl and cutaneous staples after which a sterile dressing was applied.   At the end of the procedure the following lead parameters were encountered:   Right atrial lead sensed P waves 1.9 mV, impedance 348 ohms, threshold 0.5 at 0.4 ms pulse width.  Right ventricular lead no sensed R waves due to complete heart block/pacemaker dependent, impedance 381 ohms, threshold 0.6 at 0.4 ms pulse width.  Thurmon Fair, MD, [facility] [phone] office [phone] pager 02/06/2012 2:26 PM

## 2012-02-06 NOTE — H&P (Signed)
Date of Initial H&P: 01/30/2012  History reviewed, patient examined, no change in status, stable for surgery.  Pacemaker at elective replacement interval, here for generator change. This procedure has been fully reviewed with the patient and written informed consent has been obtained.  Thurmon Fair, MD, [facility] [phone] office [phone] pager 02/06/2012 11:28 AM

## 2012-02-08 ENCOUNTER — Telehealth: Payer: Self-pay | Admitting: *Deleted

## 2012-02-08 NOTE — Telephone Encounter (Signed)
PT.'S DAUGHTER WANTED TO KNOW IF THE FISH RESULTS HAVE BEEN SENT TO DR.ALAN HARRIS IN [place]. HER MOTHER WILL SEE DR.MAGRINAT ON 02/11/12 AT 2:30PM IF ANY PAPERWORK NEEDS TO BE DONE. THIS NOTE TO DR.MAGRINAT'S NURSE'S DESK.

## 2012-02-11 ENCOUNTER — Ambulatory Visit (HOSPITAL_BASED_OUTPATIENT_CLINIC_OR_DEPARTMENT_OTHER): Payer: Medicare Other

## 2012-02-11 ENCOUNTER — Other Ambulatory Visit (HOSPITAL_BASED_OUTPATIENT_CLINIC_OR_DEPARTMENT_OTHER): Payer: Medicare Other | Admitting: Lab

## 2012-02-11 ENCOUNTER — Ambulatory Visit (HOSPITAL_BASED_OUTPATIENT_CLINIC_OR_DEPARTMENT_OTHER): Payer: Medicare Other | Admitting: Oncology

## 2012-02-11 VITALS — BP 114/71 | HR 85 | Temp 98.3°F | Ht 64.5 in | Wt 138.7 lb

## 2012-02-11 DIAGNOSIS — D72819 Decreased white blood cell count, unspecified: Secondary | ICD-10-CM

## 2012-02-11 DIAGNOSIS — D649 Anemia, unspecified: Secondary | ICD-10-CM

## 2012-02-11 DIAGNOSIS — C9400 Acute erythroid leukemia, not having achieved remission: Secondary | ICD-10-CM

## 2012-02-11 DIAGNOSIS — D709 Neutropenia, unspecified: Secondary | ICD-10-CM

## 2012-02-11 DIAGNOSIS — I629 Nontraumatic intracranial hemorrhage, unspecified: Secondary | ICD-10-CM

## 2012-02-11 DIAGNOSIS — D473 Essential (hemorrhagic) thrombocythemia: Secondary | ICD-10-CM

## 2012-02-11 DIAGNOSIS — Z5111 Encounter for antineoplastic chemotherapy: Secondary | ICD-10-CM

## 2012-02-11 LAB — CBC WITH DIFFERENTIAL/PLATELET
BASO%: 0.8 % (ref 0.0–2.0)
EOS%: 0.6 % (ref 0.0–7.0)
MCHC: 33.8 g/dL (ref 31.5–36.0)
MONO#: 0 10*3/uL — ABNORMAL LOW (ref 0.1–0.9)
RBC: 2.61 10*6/uL — ABNORMAL LOW (ref 3.70–5.45)
WBC: 1.8 10*3/uL — ABNORMAL LOW (ref 3.9–10.3)
lymph#: 1.3 10*3/uL (ref 0.9–3.3)

## 2012-02-11 MED ORDER — ONDANSETRON HCL 8 MG PO TABS
8.0000 mg | ORAL_TABLET | Freq: Once | ORAL | Status: AC
  Administered 2012-02-11: 8 mg via ORAL

## 2012-02-11 MED ORDER — AZACITIDINE CHEMO SQ INJECTION
75.0000 mg/m2 | Freq: Once | INTRAMUSCULAR | Status: AC
  Administered 2012-02-11: 130 mg via SUBCUTANEOUS
  Filled 2012-02-11: qty 26

## 2012-02-11 NOTE — Progress Notes (Signed)
ID: [name]   DOB: 1929-01-14  MR#: [id number]  CSN#:[id number]  HISTORY OF PRESENT ILLNESS: The patient sees Dr. [name] for routine health maintenance. On March 2013 he noted that the patient's hemoglobin was 8.7 (previously it had been 11.1). The MCV was at 107.1. The platelet count was 660,000. The white cell count was 4.2. He obtained a B12 and folate which were within normal limits at 1744 and 8.4 respectively.[name] He checked a ferritin to evaluate the thrombocytosis, and this was 151. Serum protein electrophoresis did not show an M spike and the urine protein electrophoresis was likewise negative. He referred the patient for further evaluation. 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 Today is day 1of cycle 2 of her subcutaneous azacytidine treatment  REVIEW OF SYSTEMS: She tolerated her blood transfusion on July 15 without any side effects whatsoever. She feels that it helped and that she has more energy. Asked whether she is unable to do anything now that she could have done 6 months ago she says there is an anything like that. She is doing which she always does. In particular there have been no fevers or temperatures, no bleeding, no bruising, and no dysuria or hematuria, diarrhea, cough, phlegm production, or unusual headaches or sinus symptoms in fact a detailed review of systems today was entirely negative.  PAST MEDICAL HISTORY: Past Medical History  Diagnosis Date  . Pacemaker     left shoulder  . Cataract     had surgery  . GERD (gastroesophageal reflux disease)   . Glaucoma   . Hyperlipidemia   . Osteoporosis   . Thyroid disease   . PUD (peptic ulcer disease)   . Lichen sclerosus et atrophicus of the vulva   . Intracranial hemorrhage     PAST SURGICAL HISTORY: Past Surgical History  Procedure Date  . Cataract extraction w/ intraocular lens  implant, bilateral   .  Dilation and curettage of uterus   . Tonsillectomy and adenoidectomy   . Pacemaker insertion     left shoulder  . Subdural hematoma evacuation via craniotomy 14-Jan-2004    after a fall  . Colonoscopy     FAMILY HISTORY Family History  Problem Relation Age of Onset  . Colon cancer Mother    the patient's father died at the age of 101 from a myocardial infarction. Her mother was diagnosed with colon cancer at the age of 56 and died from that disease within a year. The patient had 2 sisters. One died at the age of 59 with a neck fracture. The patient has 2 brothers. There is no other history of cancer or hematologic problems in the family to her knowledge  GYNECOLOGIC HISTORY: She does not recall when she had menarche. She is GX P1, first live birth age 56. She went to the change of life in her 31s. She never took hormone replacement.  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 Her husband died in 14-Jan-1996 from a heart attack. The patient lives alone, with no pets. Her daughter, [name] lives in [place]. She has a PhD in Water quality scientist and works for [employer]. Her phone number is [phone].The patient's brother [name] lives next door and in case of an emergency she will like him call first. His phone number is [phone]   ADVANCED DIRECTIVES: in place  HEALTH  MAINTENANCE: History  Substance Use Topics  . Smoking status: Former Games developer  . Smokeless tobacco: Never Used  . Alcohol Use: No     Colonoscopy: [facility]/ May 2012  PAP: "no longer"  Bone density: osteoporosis   Lipid panel: [name]             Mammogram: Nov 2012  Allergies  Allergen Reactions  . Sulfa Antibiotics     Pt. States she may have had a reaction a long time ago    Current Outpatient Prescriptions  Medication Sig Dispense Refill  . alendronate (FOSAMAX) 70 MG tablet Take 70 mg by mouth every 7 (seven) days. Take with a full glass  of water on an empty stomach. Takes on Saturdays      . aspirin 325 MG EC tablet Take 325 mg by mouth daily.       . bimatoprost (LUMIGAN) 0.03 % ophthalmic solution Place 1 drop into both eyes at bedtime.        . brinzolamide (AZOPT) 1 % ophthalmic suspension Place 1 drop into both eyes 2 (two) times daily.        . Calcium Carbonate-Vitamin D (CALTRATE 600+D PO) Take 1 tablet by mouth 2 (two) times daily with breakfast and lunch.        . Cholecalciferol (VITAMIN D) 2000 UNITS tablet Take 2,000 Units by mouth 2 (two) times daily.        . Cyanocobalamin (VITAMIN B 12 PO) Take 2,000 mcg by mouth daily.      . folic acid (FOLVITE) 400 MCG tablet Take 400 mcg by mouth daily.      [name] levothyroxine (SYNTHROID, LEVOTHROID) 88 MCG tablet Take 88 mcg by mouth daily.      . Multiple Vitamins-Minerals (MACULAR VITAMIN BENEFIT PO) Take 2 tablets by mouth daily.      [name] nystatin cream (MYCOSTATIN) Apply 1 application topically as needed.       . OMEGA-3 KRILL OIL 300 MG CAPS Take 1 capsule by mouth daily.        [name] omeprazole (PRILOSEC) 20 MG capsule Take 20 mg by mouth daily.        . ondansetron (ZOFRAN) 8 MG tablet Take 1 tablet (8 mg total) by mouth 2 (two) times daily as needed (Nausea or vomiting).  30 tablet  1  . pravastatin (PRAVACHOL) 40 MG tablet Take 40 mg by mouth daily.        . Zinc-Magnesium Aspart-Vit B6 (ZINC MAGNESIUM ASPARTATE PO) Take 1 tablet by mouth daily.         No current facility-administered medications for this visit.   Facility-Administered Medications Ordered in Other Visits  Medication Dose Route Frequency Provider Last Rate Last Dose  . azaCITIDine (VIDAZA) chemo injection 130 mg  75 mg/m2 (Treatment Plan Actual) Subcutaneous Once Lowella Dell, MD      . ondansetron [facility]) tablet 8 mg  8 mg Oral Once Lowella Dell, MD   8 mg at 02/11/12 1454    OBJECTIVE: Elderly white woman who looks remarkably well Filed Vitals:   02/11/12 1434  BP: 114/71  Pulse: 85    Temp: 98.3 F (36.8 C)     Body mass index is 23.44 kg/(m^2).    ECOG FS: 2  Sclerae unicteric Oropharynx clear No peripheral adenopathy Lungs no rales or rhonchi Heart regular rate and rhythm Abd benign, no splenomegaly by exam MSK no focal spinal tenderness, no peripheral edema Neuro: nonfocal  LAB RESULTS:  Lab Results  Component Value Date   WBC 1.8* 02/11/2012   NEUTROABS 0.5* 02/11/2012   HGB 9.3* 02/11/2012   HCT 27.4* 02/11/2012   MCV 105.0* 02/11/2012   PLT 927* 02/11/2012      Chemistry      Component Value Date/Time   NA 141 02/06/2012 1047      Component Value Date/Time   CALCIUM 9.1 02/06/2012 1047       No results found for this basename: LABCA2    No components found with this basename: [id number]     Lab 02/06/12 1047  INR 1.22    Urinalysis No results found for this basename: colorurine,  appearanceur,  labspec,  phurine,  glucoseu,  hgbur,  bilirubinur,  ketonesur,  proteinur,  urobilinogen,  nitrite,  leukocytesur    STUDIES: Dg Chest 2 View  02/06/2012  *RADIOLOGY REPORT*  Clinical Data: Preoperative assessment for permanent pacemaker generator change  CHEST - 2 VIEW  Comparison: 07/22/2004  Findings: Left subclavian sequential transvenous pacemaker leads project at right atrium and right ventricle. Upper normal heart size. Atherosclerotic calcification aortic arch. Mediastinal contours and pulmonary vascularity normal. Mild chronic bronchitic changes. No acute infiltrate, pleural effusion or pneumothorax. Bones appear demineralized.  IMPRESSION: Mild chronic bronchitic changes. No acute abnormalities.  Original Report Authenticated By: Lollie Marrow, M.D.    ASSESSMENT: 78 y.o.  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   (1) started azacytidine 01/14/2012  (2) anemia, s/p transfusion 2 units PRBCs (02/04/2012)  (3) neutropenia  PLAN: We are proceeding with a second cycle of azacitidine today. She had numerous questions regarding, how many cycles we would do and of course the answer is that we will do 3-4 and then repeat a bone marrow biopsy to assess response. I am hopeful we will be able to see evidence of recovery of her hemoglobin and white cells before then. At any rate she is tolerating treatment quite well. The plan is to see her on a weekly basis, follow her counts weekly, transfuse for a hemoglobin of less than 8 or for symptoms, and start antibiotics as soon a temperature develops. She knows to call for any problems that may develop before the next visit.   Khamiya Varin C    02/11/2012

## 2012-02-12 ENCOUNTER — Ambulatory Visit (HOSPITAL_BASED_OUTPATIENT_CLINIC_OR_DEPARTMENT_OTHER): Payer: Medicare Other

## 2012-02-12 VITALS — BP 111/56 | HR 72 | Temp 97.0°F

## 2012-02-12 DIAGNOSIS — D473 Essential (hemorrhagic) thrombocythemia: Secondary | ICD-10-CM

## 2012-02-12 DIAGNOSIS — I629 Nontraumatic intracranial hemorrhage, unspecified: Secondary | ICD-10-CM

## 2012-02-12 DIAGNOSIS — D649 Anemia, unspecified: Secondary | ICD-10-CM

## 2012-02-12 DIAGNOSIS — C9400 Acute erythroid leukemia, not having achieved remission: Secondary | ICD-10-CM

## 2012-02-12 DIAGNOSIS — D72819 Decreased white blood cell count, unspecified: Secondary | ICD-10-CM

## 2012-02-12 DIAGNOSIS — Z5111 Encounter for antineoplastic chemotherapy: Secondary | ICD-10-CM

## 2012-02-12 MED ORDER — ONDANSETRON HCL 8 MG PO TABS
8.0000 mg | ORAL_TABLET | Freq: Once | ORAL | Status: AC
  Administered 2012-02-12: 8 mg via ORAL

## 2012-02-12 MED ORDER — AZACITIDINE CHEMO SQ INJECTION
75.0000 mg/m2 | Freq: Once | INTRAMUSCULAR | Status: AC
  Administered 2012-02-12: 130 mg via SUBCUTANEOUS
  Filled 2012-02-12: qty 26

## 2012-02-12 NOTE — Patient Instructions (Signed)
Call MD for problems 

## 2012-02-13 ENCOUNTER — Ambulatory Visit (HOSPITAL_BASED_OUTPATIENT_CLINIC_OR_DEPARTMENT_OTHER): Payer: Medicare Other

## 2012-02-13 VITALS — BP 113/69 | HR 64 | Temp 97.3°F

## 2012-02-13 DIAGNOSIS — D72819 Decreased white blood cell count, unspecified: Secondary | ICD-10-CM

## 2012-02-13 DIAGNOSIS — C9400 Acute erythroid leukemia, not having achieved remission: Secondary | ICD-10-CM

## 2012-02-13 DIAGNOSIS — D649 Anemia, unspecified: Secondary | ICD-10-CM

## 2012-02-13 DIAGNOSIS — Z5111 Encounter for antineoplastic chemotherapy: Secondary | ICD-10-CM

## 2012-02-13 DIAGNOSIS — D473 Essential (hemorrhagic) thrombocythemia: Secondary | ICD-10-CM

## 2012-02-13 DIAGNOSIS — I629 Nontraumatic intracranial hemorrhage, unspecified: Secondary | ICD-10-CM

## 2012-02-13 MED ORDER — ONDANSETRON HCL 8 MG PO TABS
8.0000 mg | ORAL_TABLET | Freq: Once | ORAL | Status: AC
  Administered 2012-02-13: 8 mg via ORAL

## 2012-02-13 MED ORDER — AZACITIDINE CHEMO SQ INJECTION
75.0000 mg/m2 | Freq: Once | INTRAMUSCULAR | Status: AC
  Administered 2012-02-13: 130 mg via SUBCUTANEOUS
  Filled 2012-02-13: qty 26

## 2012-02-14 ENCOUNTER — Ambulatory Visit (HOSPITAL_BASED_OUTPATIENT_CLINIC_OR_DEPARTMENT_OTHER): Payer: Medicare Other

## 2012-02-14 VITALS — BP 123/70 | HR 69 | Temp 97.9°F

## 2012-02-14 DIAGNOSIS — Z5111 Encounter for antineoplastic chemotherapy: Secondary | ICD-10-CM

## 2012-02-14 DIAGNOSIS — C9400 Acute erythroid leukemia, not having achieved remission: Secondary | ICD-10-CM

## 2012-02-14 DIAGNOSIS — D649 Anemia, unspecified: Secondary | ICD-10-CM

## 2012-02-14 DIAGNOSIS — D473 Essential (hemorrhagic) thrombocythemia: Secondary | ICD-10-CM

## 2012-02-14 DIAGNOSIS — D72819 Decreased white blood cell count, unspecified: Secondary | ICD-10-CM

## 2012-02-14 DIAGNOSIS — I629 Nontraumatic intracranial hemorrhage, unspecified: Secondary | ICD-10-CM

## 2012-02-14 MED ORDER — AZACITIDINE CHEMO SQ INJECTION
75.0000 mg/m2 | Freq: Once | INTRAMUSCULAR | Status: AC
  Administered 2012-02-14: 130 mg via SUBCUTANEOUS
  Filled 2012-02-14: qty 26

## 2012-02-14 MED ORDER — ONDANSETRON HCL 8 MG PO TABS
8.0000 mg | ORAL_TABLET | Freq: Once | ORAL | Status: AC
  Administered 2012-02-14: 8 mg via ORAL

## 2012-02-14 NOTE — Patient Instructions (Signed)
Call MD for problems 

## 2012-02-15 ENCOUNTER — Ambulatory Visit (HOSPITAL_BASED_OUTPATIENT_CLINIC_OR_DEPARTMENT_OTHER): Payer: Medicare Other

## 2012-02-15 VITALS — BP 133/71 | HR 78 | Temp 98.1°F

## 2012-02-15 DIAGNOSIS — D649 Anemia, unspecified: Secondary | ICD-10-CM

## 2012-02-15 DIAGNOSIS — D72819 Decreased white blood cell count, unspecified: Secondary | ICD-10-CM

## 2012-02-15 DIAGNOSIS — D75839 Thrombocytosis, unspecified: Secondary | ICD-10-CM

## 2012-02-15 DIAGNOSIS — C9400 Acute erythroid leukemia, not having achieved remission: Secondary | ICD-10-CM

## 2012-02-15 DIAGNOSIS — I629 Nontraumatic intracranial hemorrhage, unspecified: Secondary | ICD-10-CM

## 2012-02-15 DIAGNOSIS — D473 Essential (hemorrhagic) thrombocythemia: Secondary | ICD-10-CM

## 2012-02-15 DIAGNOSIS — Z5111 Encounter for antineoplastic chemotherapy: Secondary | ICD-10-CM

## 2012-02-15 MED ORDER — ONDANSETRON HCL 8 MG PO TABS
8.0000 mg | ORAL_TABLET | Freq: Once | ORAL | Status: AC
  Administered 2012-02-15: 8 mg via ORAL

## 2012-02-15 MED ORDER — AZACITIDINE CHEMO SQ INJECTION
75.0000 mg/m2 | Freq: Once | INTRAMUSCULAR | Status: AC
  Administered 2012-02-15: 130 mg via SUBCUTANEOUS
  Filled 2012-02-15: qty 26

## 2012-02-15 NOTE — Patient Instructions (Addendum)
[facility] Discharge Instructions for Patients Receiving Chemotherapy  Today you received the following chemotherapy agents Vidaza  To help prevent nausea and vomiting after your treatment, we encourage you to take your nausea medication Begin taking it at 7 pm and take it as often as prescribed for the next 24 to 72 hours.   If you develop nausea and vomiting that is not controlled by your nausea medication, call the clinic. If it is after clinic hours your family physician or the after hours number for the clinic or go to the Emergency Department.   BELOW ARE SYMPTOMS THAT SHOULD BE REPORTED IMMEDIATELY:  *FEVER GREATER THAN 100.5 F  *CHILLS WITH OR WITHOUT FEVER  NAUSEA AND VOMITING THAT IS NOT CONTROLLED WITH YOUR NAUSEA MEDICATION  *UNUSUAL SHORTNESS OF BREATH  *UNUSUAL BRUISING OR BLEEDING  TENDERNESS IN MOUTH AND THROAT WITH OR WITHOUT PRESENCE OF ULCERS  *URINARY PROBLEMS  *BOWEL PROBLEMS  UNUSUAL RASH Items with * indicate a potential emergency and should be followed up as soon as possible.  One of the nurses will contact you 24 hours after your treatment. Please let the nurse know about any problems that you may have experienced. Feel free to call the clinic you have any questions or concerns. The clinic phone number is [phone].   I have been informed and understand all the instructions given to me. I know to contact the clinic, my physician, or go to the Emergency Department if any problems should occur. I do not have any questions at this time, but understand that I may call the clinic during office hours   should I have any questions or need assistance in obtaining follow up care.    __________________________________________  _____________  __________ Signature of Patient or Authorized Representative            Date                   Time    __________________________________________ Nurse's Signature    

## 2012-02-18 ENCOUNTER — Ambulatory Visit (HOSPITAL_BASED_OUTPATIENT_CLINIC_OR_DEPARTMENT_OTHER): Payer: Medicare Other

## 2012-02-18 DIAGNOSIS — Z5111 Encounter for antineoplastic chemotherapy: Secondary | ICD-10-CM

## 2012-02-18 DIAGNOSIS — D473 Essential (hemorrhagic) thrombocythemia: Secondary | ICD-10-CM

## 2012-02-18 DIAGNOSIS — D72819 Decreased white blood cell count, unspecified: Secondary | ICD-10-CM

## 2012-02-18 DIAGNOSIS — D649 Anemia, unspecified: Secondary | ICD-10-CM

## 2012-02-18 DIAGNOSIS — I629 Nontraumatic intracranial hemorrhage, unspecified: Secondary | ICD-10-CM

## 2012-02-18 DIAGNOSIS — C9400 Acute erythroid leukemia, not having achieved remission: Secondary | ICD-10-CM

## 2012-02-18 MED ORDER — AZACITIDINE CHEMO SQ INJECTION
75.0000 mg/m2 | Freq: Once | INTRAMUSCULAR | Status: AC
  Administered 2012-02-18: 130 mg via SUBCUTANEOUS
  Filled 2012-02-18: qty 26

## 2012-02-18 MED ORDER — ONDANSETRON HCL 8 MG PO TABS
8.0000 mg | ORAL_TABLET | Freq: Once | ORAL | Status: AC
  Administered 2012-02-18: 8 mg via ORAL

## 2012-02-19 ENCOUNTER — Ambulatory Visit (HOSPITAL_BASED_OUTPATIENT_CLINIC_OR_DEPARTMENT_OTHER): Payer: Medicare Other

## 2012-02-19 VITALS — BP 131/74 | HR 90 | Temp 97.8°F

## 2012-02-19 DIAGNOSIS — C9400 Acute erythroid leukemia, not having achieved remission: Secondary | ICD-10-CM

## 2012-02-19 DIAGNOSIS — D473 Essential (hemorrhagic) thrombocythemia: Secondary | ICD-10-CM

## 2012-02-19 DIAGNOSIS — D649 Anemia, unspecified: Secondary | ICD-10-CM

## 2012-02-19 DIAGNOSIS — I629 Nontraumatic intracranial hemorrhage, unspecified: Secondary | ICD-10-CM

## 2012-02-19 DIAGNOSIS — Z5111 Encounter for antineoplastic chemotherapy: Secondary | ICD-10-CM

## 2012-02-19 DIAGNOSIS — D72819 Decreased white blood cell count, unspecified: Secondary | ICD-10-CM

## 2012-02-19 MED ORDER — AZACITIDINE CHEMO SQ INJECTION
75.0000 mg/m2 | Freq: Once | INTRAMUSCULAR | Status: AC
  Administered 2012-02-19: 130 mg via SUBCUTANEOUS
  Filled 2012-02-19: qty 26

## 2012-02-19 MED ORDER — ONDANSETRON HCL 8 MG PO TABS
8.0000 mg | ORAL_TABLET | Freq: Once | ORAL | Status: AC
  Administered 2012-02-19: 8 mg via ORAL

## 2012-02-21 ENCOUNTER — Other Ambulatory Visit: Payer: Self-pay | Admitting: Certified Registered Nurse Anesthetist

## 2012-02-25 ENCOUNTER — Other Ambulatory Visit (HOSPITAL_BASED_OUTPATIENT_CLINIC_OR_DEPARTMENT_OTHER): Payer: Medicare Other | Admitting: Lab

## 2012-02-25 ENCOUNTER — Ambulatory Visit: Payer: Medicare Other | Admitting: Lab

## 2012-02-25 ENCOUNTER — Ambulatory Visit (HOSPITAL_BASED_OUTPATIENT_CLINIC_OR_DEPARTMENT_OTHER): Payer: Medicare Other | Admitting: Physician Assistant

## 2012-02-25 ENCOUNTER — Encounter: Payer: Self-pay | Admitting: Physician Assistant

## 2012-02-25 ENCOUNTER — Encounter (HOSPITAL_COMMUNITY)
Admission: RE | Admit: 2012-02-25 | Discharge: 2012-02-25 | Disposition: A | Discharge status: Home / Self Care | Payer: Medicare Other | Source: Ambulatory Visit | Attending: Oncology | Admitting: Oncology

## 2012-02-25 ENCOUNTER — Telehealth: Payer: Self-pay | Admitting: Oncology

## 2012-02-25 VITALS — BP 129/69 | HR 85 | Temp 97.8°F | Resp 20 | Ht 64.5 in | Wt 139.6 lb

## 2012-02-25 DIAGNOSIS — C9400 Acute erythroid leukemia, not having achieved remission: Secondary | ICD-10-CM

## 2012-02-25 DIAGNOSIS — D469 Myelodysplastic syndrome, unspecified: Secondary | ICD-10-CM

## 2012-02-25 DIAGNOSIS — D649 Anemia, unspecified: Secondary | ICD-10-CM

## 2012-02-25 DIAGNOSIS — D709 Neutropenia, unspecified: Secondary | ICD-10-CM

## 2012-02-25 DIAGNOSIS — D72819 Decreased white blood cell count, unspecified: Secondary | ICD-10-CM

## 2012-02-25 LAB — CBC WITH DIFFERENTIAL/PLATELET
BASO%: 0.3 % (ref 0.0–2.0)
Basophils Absolute: 0 10*3/uL (ref 0.0–0.1)
HCT: 22.9 % — ABNORMAL LOW (ref 34.8–46.6)
HGB: 7.8 g/dL — ABNORMAL LOW (ref 11.6–15.9)
MCHC: 34 g/dL (ref 31.5–36.0)
MONO#: 0.1 10*3/uL (ref 0.1–0.9)
NEUT%: 28.4 % — ABNORMAL LOW (ref 38.4–76.8)
RDW: 17.7 % — ABNORMAL HIGH (ref 11.2–14.5)
WBC: 2.6 10*3/uL — ABNORMAL LOW (ref 3.9–10.3)
lymph#: 1.7 10*3/uL (ref 0.9–3.3)

## 2012-02-25 LAB — PREPARE RBC (CROSSMATCH)

## 2012-02-25 MED ORDER — CIPROFLOXACIN HCL 500 MG PO TABS: 500.0000 mg | ORAL_TABLET | Freq: Two times a day (BID) | ORAL | Status: AC

## 2012-02-25 NOTE — Progress Notes (Signed)
ID: [name]   DOB: Dec 05, 1928  MR#: [id number]  CSN#:[id number]  HISTORY OF PRESENT ILLNESS: The patient sees Dr. [name] for routine health maintenance. On March 2013 he noted that the patient's hemoglobin was 8.7 (previously it had been 11.1). The MCV was at 107.1. The platelet count was 660,000. The white cell count was 4.2. He obtained a B12 and folate which were within normal limits at 1744 and 8.4 respectively.[name] He checked a ferritin to evaluate the thrombocytosis, and this was 151. Serum protein electrophoresis did not show an M spike and the urine protein electrophoresis was likewise negative. He referred the patient for further evaluation. 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  She just completed her second cycle of subcutaneous  Vidaza, her last injection given last Tuesday, July 30. She continues to tolerate treatment well. She is feeling well, and in fact tells me she "doesn't even feel like she has this". She continues to exercise daily, walking at least 20 minutes per day.  REVIEW OF SYSTEMS: [name] has had no fevers, chills, or night sweats. She's had no rashes, no abnormal bruising, no abnormal bleeding. No peripheral swelling. No evidence of abnormal clotting. She's eating and drinking fairly well and denies any nausea or change in bowel habits. No cough, phlegm production, or increased shortness of breath. No chest pain or pressure. No abnormal headaches or dizziness. No unusual myalgias or arthralgias.  In fact a detailed review of systems is otherwise stable and noncontributory today.   PAST MEDICAL HISTORY: Past Medical History  Diagnosis Date  . Pacemaker     left shoulder  . Cataract     had surgery  . GERD (gastroesophageal reflux disease)   . Glaucoma   . Hyperlipidemia   . Osteoporosis   . Thyroid disease   . PUD (peptic ulcer disease)   . Lichen sclerosus et  atrophicus of the vulva   . Intracranial hemorrhage     PAST SURGICAL HISTORY: Past Surgical History  Procedure Date  . Cataract extraction w/ intraocular lens  implant, bilateral   . Dilation and curettage of uterus   . Tonsillectomy and adenoidectomy   . Pacemaker insertion     left shoulder  . Subdural hematoma evacuation via craniotomy 12-26-2003    after a fall  . Colonoscopy     FAMILY HISTORY Family History  Problem Relation Age of Onset  . Colon cancer Mother    the patient's father died at the age of 24 from a myocardial infarction. Her mother was diagnosed with colon cancer at the age of 25 and died from that disease within a year. The patient had 2 sisters. One died at the age of 16 with a neck fracture. The patient has 2 brothers. There is no other history of cancer or hematologic problems in the family to her knowledge  GYNECOLOGIC HISTORY: She does not recall when she had menarche. She is GX P1, first live birth age 38. She went to the change of life in her 38s. She never took hormone replacement.  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 Her husband died in 12-26-1995 from a heart attack. The patient lives alone, with no pets. Her daughter, [name] lives in [place]. She has a PhD in Water quality scientist and works for [employer]. Her phone number is [phone].The patient's brother Chloe Stable  Lodema Conner lives next door and in case of an emergency she will like him call first. His phone number is [phone]   ADVANCED DIRECTIVES: in place  HEALTH MAINTENANCE: History  Substance Use Topics  . Smoking status: Former Games developer  . Smokeless tobacco: Never Used  . Alcohol Use: No     Colonoscopy: [facility]/ May 2012  PAP: "no longer"  Bone density: osteoporosis   Lipid panel: [name]             Mammogram: Nov 2012  Allergies  Allergen Reactions  . Sulfa Antibiotics     Pt. States she may have had a reaction  a long time ago    Current Outpatient Prescriptions  Medication Sig Dispense Refill  . alendronate (FOSAMAX) 70 MG tablet Take 70 mg by mouth every 7 (seven) days. Take with a full glass of water on an empty stomach. Takes on Saturdays      . aspirin 325 MG EC tablet Take 325 mg by mouth daily.       . bimatoprost (LUMIGAN) 0.03 % ophthalmic solution Place 1 drop into both eyes at bedtime.        . brinzolamide (AZOPT) 1 % ophthalmic suspension Place 1 drop into both eyes 2 (two) times daily.        . Calcium Carbonate-Vitamin D (CALTRATE 600+D PO) Take 1 tablet by mouth 2 (two) times daily with breakfast and lunch.        . Cholecalciferol (VITAMIN D) 2000 UNITS tablet Take 2,000 Units by mouth 2 (two) times daily.        . ciprofloxacin (CIPRO) 500 MG tablet Take 1 tablet (500 mg total) by mouth 2 (two) times daily.  28 tablet  0  . Cyanocobalamin (VITAMIN B 12 PO) Take 2,000 mcg by mouth daily.      . folic acid (FOLVITE) 400 MCG tablet Take 400 mcg by mouth daily.      [name] levothyroxine (SYNTHROID, LEVOTHROID) 88 MCG tablet Take 88 mcg by mouth daily.      . Multiple Vitamins-Minerals (MACULAR VITAMIN BENEFIT PO) Take 2 tablets by mouth daily.      [name] nystatin cream (MYCOSTATIN) Apply 1 application topically as needed.       . OMEGA-3 KRILL OIL 300 MG CAPS Take 1 capsule by mouth daily.        [name] omeprazole (PRILOSEC) 20 MG capsule Take 20 mg by mouth daily.        . ondansetron (ZOFRAN) 8 MG tablet Take 1 tablet (8 mg total) by mouth 2 (two) times daily as needed (Nausea or vomiting).  30 tablet  1  . pravastatin (PRAVACHOL) 40 MG tablet Take 40 mg by mouth daily.        . Zinc-Magnesium Aspart-Vit B6 (ZINC MAGNESIUM ASPARTATE PO) Take 1 tablet by mouth daily.          OBJECTIVE: Elderly white woman who looks remarkably well and is in no acute distress. Filed Vitals:   02/25/12 1450  BP: 129/69  Pulse: 85  Temp: 97.8 F (36.6 C)  Resp: 20     Body mass index is 23.59 kg/(m^2).     ECOG FS: 2  Filed Weights   02/25/12 1450  Weight: 139 lb 9.6 oz (63.322 kg)   Physical Exam: HEENT:  Sclerae anicteric, conjunctivae pale.  Oropharynx clear.  No mucositis or candidiasis.   Nodes:  No cervical, supraclavicular, or axillary lymphadenopathy palpated.  Breast Exam:  Deferred  Lungs:  Clear to auscultation bilaterally.  No crackles, rhonchi, or wheezes.   Heart:  Regular rate and rhythm.   Abdomen:  Soft, nontender.  Positive bowel sounds.  No organomegaly or masses palpated.   Musculoskeletal:  No focal spinal tenderness to palpation.  Extremities:  Benign.  No peripheral edema or cyanosis.   Skin:  Benign.   Neuro:  Nonfocal. Alert and oriented x3.    LAB RESULTS:   Lab Results  Component Value Date   WBC 2.6* 02/25/2012   NEUTROABS 0.7* 02/25/2012   HGB 7.8* 02/25/2012   HCT 22.9* 02/25/2012   MCV 107.0* 02/25/2012   PLT 1,539* 02/25/2012      Chemistry      Component Value Date/Time   NA 141 02/06/2012 1047      Component Value Date/Time   CALCIUM 9.1 02/06/2012 1047       STUDIES: Dg Chest 2 View  02/06/2012  *RADIOLOGY REPORT*  Clinical Data: Preoperative assessment for permanent pacemaker generator change  CHEST - 2 VIEW  Comparison: 07/22/2004  Findings: Left subclavian sequential transvenous pacemaker leads project at right atrium and right ventricle. Upper normal heart size. Atherosclerotic calcification aortic arch. Mediastinal contours and pulmonary vascularity normal. Mild chronic bronchitic changes. No acute infiltrate, pleural effusion or pneumothorax. Bones appear demineralized.  IMPRESSION: Mild chronic bronchitic changes. No acute abnormalities.  Original Report Authenticated By: Lollie Marrow, M.D.    ASSESSMENT: 78 y.o.  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   (1) started azacytidine 01/14/2012  (2) anemia, s/p transfusion 2 units PRBCs (02/04/2012)  (3) neutropenia  PLAN:  This case was reviewed with Dr. Welton Flakes today and Dr. Darnelle Catalan absence. We have reviewed neutropenic precautions once again with Rosa and her Dr. Milta Deiters suggestion, she is being started prophylactically on Cipro, 500 mg by mouth twice a day for at least the next 14 days. She'll also continue on her aspirin 325 mg daily.  We will plan on transfusing [place] with 2 units of packed red blood cells on Wednesday, August 7. We will repeat her labs only next week, and she will return to see Dr. Darnelle Catalan with labs and physical exam on August 19 prior to initiating her third cycle of Vidaza.  The overall plan is to complete a 3 or 4 cycles and then repeat a bone marrow biopsy to assess response. I am hopeful we will be able to see evidence of recovery of her hemoglobin and white cells before then. At any rate she is tolerating treatment quite well. The plan is to see her on a weekly basis, follow her counts weekly, transfuse for a hemoglobin of less than 8 or for symptoms. She knows to call for any problems that may develop before the next visit.   [name]    02/25/2012

## 2012-02-25 NOTE — Telephone Encounter (Signed)
gve the pt her aug,sept 2013 appt calendar °

## 2012-02-25 NOTE — Telephone Encounter (Signed)
gve the pt her aug,sept 2013 appt calendar. The pt is aware that we will call her with the blood transfusion appt

## 2012-02-28 ENCOUNTER — Encounter (HOSPITAL_COMMUNITY)
Admission: RE | Admit: 2012-02-28 | Discharge: 2012-02-28 | Disposition: A | Discharge status: Home / Self Care | Payer: Medicare Other | Source: Ambulatory Visit | Attending: Oncology | Admitting: Oncology

## 2012-02-28 ENCOUNTER — Encounter (HOSPITAL_COMMUNITY): Payer: Self-pay

## 2012-02-28 VITALS — BP 108/64 | HR 74 | Temp 97.4°F | Resp 18 | Ht 64.5 in | Wt 139.0 lb

## 2012-02-28 DIAGNOSIS — D469 Myelodysplastic syndrome, unspecified: Secondary | ICD-10-CM

## 2012-02-28 DIAGNOSIS — C9400 Acute erythroid leukemia, not having achieved remission: Secondary | ICD-10-CM

## 2012-02-28 MED ORDER — ACETAMINOPHEN 325 MG PO TABS
650.0000 mg | ORAL_TABLET | Freq: Once | ORAL | Status: AC
  Administered 2012-02-28: 650 mg via ORAL
  Filled 2012-02-28: qty 2

## 2012-02-28 MED ORDER — DIPHENHYDRAMINE HCL 25 MG PO CAPS
25.0000 mg | ORAL_CAPSULE | Freq: Once | ORAL | Status: AC
  Administered 2012-02-28: 25 mg via ORAL
  Filled 2012-02-28: qty 6

## 2012-02-28 MED ORDER — SODIUM CHLORIDE 0.9 % IV SOLN
250.0000 mL | Freq: Once | INTRAVENOUS | Status: AC
  Administered 2012-02-28: 250 mL via INTRAVENOUS

## 2012-02-29 ENCOUNTER — Other Ambulatory Visit: Payer: Self-pay | Admitting: *Deleted

## 2012-02-29 DIAGNOSIS — C9400 Acute erythroid leukemia, not having achieved remission: Secondary | ICD-10-CM

## 2012-02-29 LAB — TYPE AND SCREEN
ABO/RH(D): A POS
DAT, IgG: NEGATIVE
Unit division: 0
Unit division: 0

## 2012-03-03 ENCOUNTER — Other Ambulatory Visit (HOSPITAL_BASED_OUTPATIENT_CLINIC_OR_DEPARTMENT_OTHER): Payer: Medicare Other | Admitting: Lab

## 2012-03-03 DIAGNOSIS — C9400 Acute erythroid leukemia, not having achieved remission: Secondary | ICD-10-CM

## 2012-03-03 LAB — CBC WITH DIFFERENTIAL/PLATELET
Basophils Absolute: 0 10*3/uL (ref 0.0–0.1)
Eosinophils Absolute: 0 10*3/uL (ref 0.0–0.5)
HCT: 26.9 % — ABNORMAL LOW (ref 34.8–46.6)
LYMPH%: 69.7 % — ABNORMAL HIGH (ref 14.0–49.7)
MCV: 99.5 fL (ref 79.5–101.0)
MONO#: 0 10*3/uL — ABNORMAL LOW (ref 0.1–0.9)
MONO%: 1.7 % (ref 0.0–14.0)
NEUT#: 0.5 10*3/uL — ABNORMAL LOW (ref 1.5–6.5)
NEUT%: 26.3 % — ABNORMAL LOW (ref 38.4–76.8)
Platelets: 1153 10*3/uL — ABNORMAL HIGH (ref 145–400)
RBC: 2.7 10*6/uL — ABNORMAL LOW (ref 3.70–5.45)
WBC: 1.9 10*3/uL — ABNORMAL LOW (ref 3.9–10.3)

## 2012-03-10 ENCOUNTER — Ambulatory Visit (HOSPITAL_BASED_OUTPATIENT_CLINIC_OR_DEPARTMENT_OTHER): Payer: Medicare Other

## 2012-03-10 ENCOUNTER — Other Ambulatory Visit (HOSPITAL_BASED_OUTPATIENT_CLINIC_OR_DEPARTMENT_OTHER): Payer: Medicare Other | Admitting: Lab

## 2012-03-10 ENCOUNTER — Other Ambulatory Visit: Payer: Self-pay | Admitting: Emergency Medicine

## 2012-03-10 ENCOUNTER — Ambulatory Visit (HOSPITAL_BASED_OUTPATIENT_CLINIC_OR_DEPARTMENT_OTHER): Payer: Medicare Other | Admitting: Oncology

## 2012-03-10 ENCOUNTER — Telehealth: Payer: Self-pay | Admitting: Oncology

## 2012-03-10 VITALS — BP 124/70 | HR 83 | Temp 98.3°F | Resp 18 | Ht 64.5 in | Wt 139.1 lb

## 2012-03-10 DIAGNOSIS — C9400 Acute erythroid leukemia, not having achieved remission: Secondary | ICD-10-CM

## 2012-03-10 DIAGNOSIS — D649 Anemia, unspecified: Secondary | ICD-10-CM

## 2012-03-10 DIAGNOSIS — Z5111 Encounter for antineoplastic chemotherapy: Secondary | ICD-10-CM

## 2012-03-10 DIAGNOSIS — D473 Essential (hemorrhagic) thrombocythemia: Secondary | ICD-10-CM

## 2012-03-10 DIAGNOSIS — D72819 Decreased white blood cell count, unspecified: Secondary | ICD-10-CM

## 2012-03-10 LAB — CBC WITH DIFFERENTIAL/PLATELET
BASO%: 1.2 % (ref 0.0–2.0)
EOS%: 0.8 % (ref 0.0–7.0)
HCT: 27.4 % — ABNORMAL LOW (ref 34.8–46.6)
LYMPH%: 69.3 % — ABNORMAL HIGH (ref 14.0–49.7)
MCH: 33.6 pg (ref 25.1–34.0)
MCHC: 34 g/dL (ref 31.5–36.0)
MCV: 98.9 fL (ref 79.5–101.0)
MONO#: 0 10*3/uL — ABNORMAL LOW (ref 0.1–0.9)
MONO%: 1.4 % (ref 0.0–14.0)
NEUT%: 27.3 % — ABNORMAL LOW (ref 38.4–76.8)
Platelets: 1006 10*3/uL — ABNORMAL HIGH (ref 145–400)
RBC: 2.77 10*6/uL — ABNORMAL LOW (ref 3.70–5.45)

## 2012-03-10 MED ORDER — ONDANSETRON HCL 8 MG PO TABS
8.0000 mg | ORAL_TABLET | Freq: Once | ORAL | Status: AC
  Administered 2012-03-10: 8 mg via ORAL

## 2012-03-10 MED ORDER — AZACITIDINE CHEMO SQ INJECTION
75.0000 mg/m2 | Freq: Once | INTRAMUSCULAR | Status: AC
  Administered 2012-03-10: 130 mg via SUBCUTANEOUS
  Filled 2012-03-10: qty 5.2

## 2012-03-10 NOTE — Telephone Encounter (Signed)
S/w alisha in rad scheduling regarding the pt's bone marrow biopsy appt

## 2012-03-10 NOTE — Telephone Encounter (Signed)
S/w the pt and she is aware that she will be contacted with the bmbx appt from rad

## 2012-03-10 NOTE — Progress Notes (Signed)
ID: [name]   DOB: [birth year]  MR#: [id number]  CSN#:[id number]  HISTORY OF PRESENT ILLNESS: The patient sees Dr. [name] for routine health maintenance. On March 2013 he noted that the patient's hemoglobin was 8.7 (previously it had been 11.1). The MCV was at 107.1. The platelet count was 660,000. The white cell count was 4.2. He obtained a B12 and folate which were within normal limits at 1744 and 8.4 respectively.[name] He checked a ferritin to evaluate the thrombocytosis, and this was 151. Serum protein electrophoresis did not show an M spike and the urine protein electrophoresis was likewise negative. He referred the patient for further evaluation. 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 Since her last visit here she had 2 more units of packed red cells, which he tolerated well. Clinically there was not much change.  REVIEW OF SYSTEMS: She continues to do remarkably well. She does crossword puzzles, Cokes lunch, drives her friends to Dr. appointments, and takes care of her brother who is also sick. She's had no fevers, sweats, bleeding or bruising, unexplained fatigue, cough, or shortness of breath. His been no change in bowel or bladder habits. She has no pain. A detailed review of systems was otherwise entirely noncontributory.   PAST MEDICAL HISTORY: Past Medical History  Diagnosis Date  . Pacemaker     left shoulder  . Cataract     had surgery  . GERD (gastroesophageal reflux disease)   . Glaucoma   . Hyperlipidemia   . Osteoporosis   . Thyroid disease   . PUD (peptic ulcer disease)   . Lichen sclerosus et atrophicus of the vulva   . Intracranial hemorrhage     PAST SURGICAL HISTORY: Past Surgical History  Procedure Date  . Cataract extraction w/ intraocular lens  implant, bilateral   . Dilation and curettage of uterus   . Tonsillectomy and adenoidectomy   . Pacemaker  insertion     left shoulder  . Subdural hematoma evacuation via craniotomy 12-24-03    after a fall  . Colonoscopy     FAMILY HISTORY Family History  Problem Relation Age of Onset  . Colon cancer Mother    the patient's father died at the age of 20 from a myocardial infarction. Her mother was diagnosed with colon cancer at the age of 93 and died from that disease within a year. The patient had 2 sisters. One died at the age of 18 with a neck fracture. The patient has 2 brothers. There is no other history of cancer or hematologic problems in the family to her knowledge  GYNECOLOGIC HISTORY: She does not recall when she had menarche. She is GX P1, first live birth age 21. She went to the change of life in her 57s. She never took hormone replacement.  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 She had top clearance. Her husband died in 1995/12/24 from a heart attack. The patient lives alone, with no pets. Her daughter, [name] lives in [place]. She has a PhD in Water quality scientist and works for [employer]. Her phone number is [phone].The patient's brother [name] lives next door and in case of an emergency she will like him call first. His phone number is [phone]   ADVANCED DIRECTIVES: in place  HEALTH MAINTENANCE: History  Substance Use Topics  . Smoking status: Former Games developer  . Smokeless tobacco:  Never Used  . Alcohol Use: No     Colonoscopy: [facility]/ May 2012  PAP: "no longer"  Bone density: osteoporosis   Lipid panel: [name]             Mammogram: Nov 2012  Allergies  Allergen Reactions  . Sulfa Antibiotics     Pt. States she may have had a reaction a long time ago    Current Outpatient Prescriptions  Medication Sig Dispense Refill  . alendronate (FOSAMAX) 70 MG tablet Take 70 mg by mouth every 7 (seven) days. Take with a full glass of water on an empty stomach. Takes on Saturdays      . aspirin 325 MG EC tablet  Take 325 mg by mouth daily.       . bimatoprost (LUMIGAN) 0.03 % ophthalmic solution Place 1 drop into both eyes at bedtime.        . brinzolamide (AZOPT) 1 % ophthalmic suspension Place 1 drop into both eyes 2 (two) times daily.        . Calcium Carbonate-Vitamin D (CALTRATE 600+D PO) Take 1 tablet by mouth 2 (two) times daily with breakfast and lunch.        . Cholecalciferol (VITAMIN D) 2000 UNITS tablet Take 2,000 Units by mouth 2 (two) times daily.        . Cyanocobalamin (VITAMIN B 12 PO) Take 2,000 mcg by mouth daily.      . folic acid (FOLVITE) 400 MCG tablet Take 400 mcg by mouth daily.      [name] levothyroxine (SYNTHROID, LEVOTHROID) 88 MCG tablet Take 88 mcg by mouth daily.      . Multiple Vitamins-Minerals (MACULAR VITAMIN BENEFIT PO) Take 2 tablets by mouth daily.      [name] nystatin cream (MYCOSTATIN) Apply 1 application topically as needed.       . OMEGA-3 KRILL OIL 300 MG CAPS Take 1 capsule by mouth daily.        [name] omeprazole (PRILOSEC) 20 MG capsule Take 20 mg by mouth daily.        . pravastatin (PRAVACHOL) 40 MG tablet Take 40 mg by mouth daily.        . Zinc-Magnesium Aspart-Vit B6 (ZINC MAGNESIUM ASPARTATE PO) Take 1 tablet by mouth daily.          OBJECTIVE: Elderly white woman  in no acute distress. Filed Vitals:   03/10/12 1419  BP: 124/70  Pulse: 83  Temp: 98.3 F (36.8 C)  Resp: 18     Body mass index is 23.51 kg/(m^2).    ECOG FS: 1  Filed Weights   03/10/12 1419  Weight: 139 lb 1.6 oz (63.095 kg)   Physical Exam: HEENT:  Sclerae anicteric.  Oropharynx clear.  No mucositis or candidiasis.   Nodes:  No cervical, supraclavicular, or axillary lymphadenopathy palpated.  Breast Exam:  Deferred  Lungs:  Clear to auscultation bilaterally.  No crackles, rhonchi, or wheezes.   Heart:  Regular rate and rhythm.  Pacemaker in place Abdomen:  Soft, nontender.  Positive bowel sounds.  No organomegaly or masses palpated.   Musculoskeletal:  No focal spinal tenderness to  palpation.  Extremities:  No peripheral edema or cyanosis.   Skin:  No bruises   Neuro:  Nonfocal. Alert and oriented x3.    LAB RESULTS:   Lab Results  Component Value Date   WBC 1.7* 03/10/2012   NEUTROABS 0.5* 03/10/2012   HGB 9.3* 03/10/2012   HCT 27.4* 03/10/2012   MCV  98.9 03/10/2012   PLT 1,006* 03/10/2012      Chemistry      Component Value Date/Time   NA 141 02/06/2012 1047      Component Value Date/Time   CALCIUM 9.1 02/06/2012 1047       STUDIES: Dg Chest 2 View  02/06/2012  *RADIOLOGY REPORT*  Clinical Data: Preoperative assessment for permanent pacemaker generator change  CHEST - 2 VIEW  Comparison: 07/22/2004  Findings: Left subclavian sequential transvenous pacemaker leads project at right atrium and right ventricle. Upper normal heart size. Atherosclerotic calcification aortic arch. Mediastinal contours and pulmonary vascularity normal. Mild chronic bronchitic changes. No acute infiltrate, pleural effusion or pneumothorax. Bones appear demineralized.  IMPRESSION: Mild chronic bronchitic changes. No acute abnormalities.  Original Report Authenticated By: Lollie Marrow, M.D.    ASSESSMENT: 78 y.o.  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   (1) started azacytidine 01/14/2012  (2) anemia, s/p transfusion 2 units PRBCs (02/04/2012, 02/28/2012)  (3) neutropenia  PLAN:  We are starting the third cycle of 80s azacitidine today. I am at 18 a ferritin to her next set of labs. She will need a repeat bone marrow biopsy before cycle 4. The biopsies being scheduled for September 9 or September 10. I will review that with Dr. Lowell Guitar at [facility] once we get the results. For now we are continuing with  treatment as already planned. Kaydyn looks very stable, not obviously better, and not obviously worse. She knows to call for any problems that may develop before the next scheduled visit.   Mabry Tift C    03/10/2012

## 2012-03-11 ENCOUNTER — Ambulatory Visit (HOSPITAL_BASED_OUTPATIENT_CLINIC_OR_DEPARTMENT_OTHER): Payer: Medicare Other

## 2012-03-11 ENCOUNTER — Encounter (HOSPITAL_COMMUNITY): Payer: Self-pay | Admitting: Pharmacy Technician

## 2012-03-11 VITALS — BP 116/46 | HR 62 | Temp 97.9°F | Resp 18

## 2012-03-11 DIAGNOSIS — I629 Nontraumatic intracranial hemorrhage, unspecified: Secondary | ICD-10-CM

## 2012-03-11 DIAGNOSIS — Z5111 Encounter for antineoplastic chemotherapy: Secondary | ICD-10-CM

## 2012-03-11 DIAGNOSIS — C9400 Acute erythroid leukemia, not having achieved remission: Secondary | ICD-10-CM

## 2012-03-11 DIAGNOSIS — D649 Anemia, unspecified: Secondary | ICD-10-CM

## 2012-03-11 DIAGNOSIS — D72819 Decreased white blood cell count, unspecified: Secondary | ICD-10-CM

## 2012-03-11 DIAGNOSIS — D473 Essential (hemorrhagic) thrombocythemia: Secondary | ICD-10-CM

## 2012-03-11 MED ORDER — AZACITIDINE CHEMO SQ INJECTION
75.0000 mg/m2 | Freq: Once | INTRAMUSCULAR | Status: AC
  Administered 2012-03-11: 130 mg via SUBCUTANEOUS
  Filled 2012-03-11: qty 26

## 2012-03-11 MED ORDER — ONDANSETRON HCL 8 MG PO TABS
8.0000 mg | ORAL_TABLET | Freq: Once | ORAL | Status: AC
  Administered 2012-03-11: 8 mg via ORAL

## 2012-03-12 ENCOUNTER — Ambulatory Visit (HOSPITAL_BASED_OUTPATIENT_CLINIC_OR_DEPARTMENT_OTHER): Payer: Medicare Other

## 2012-03-12 VITALS — BP 125/52 | HR 73 | Temp 97.0°F

## 2012-03-12 DIAGNOSIS — Z5111 Encounter for antineoplastic chemotherapy: Secondary | ICD-10-CM

## 2012-03-12 DIAGNOSIS — I629 Nontraumatic intracranial hemorrhage, unspecified: Secondary | ICD-10-CM

## 2012-03-12 DIAGNOSIS — D72819 Decreased white blood cell count, unspecified: Secondary | ICD-10-CM

## 2012-03-12 DIAGNOSIS — D649 Anemia, unspecified: Secondary | ICD-10-CM

## 2012-03-12 DIAGNOSIS — D473 Essential (hemorrhagic) thrombocythemia: Secondary | ICD-10-CM

## 2012-03-12 DIAGNOSIS — C9400 Acute erythroid leukemia, not having achieved remission: Secondary | ICD-10-CM

## 2012-03-12 MED ORDER — ONDANSETRON HCL 8 MG PO TABS
8.0000 mg | ORAL_TABLET | Freq: Once | ORAL | Status: AC
  Administered 2012-03-12: 8 mg via ORAL

## 2012-03-12 MED ORDER — AZACITIDINE CHEMO SQ INJECTION
75.0000 mg/m2 | Freq: Once | INTRAMUSCULAR | Status: AC
  Administered 2012-03-12: 130 mg via SUBCUTANEOUS
  Filled 2012-03-12: qty 26

## 2012-03-13 ENCOUNTER — Other Ambulatory Visit (HOSPITAL_BASED_OUTPATIENT_CLINIC_OR_DEPARTMENT_OTHER): Payer: Medicare Other | Admitting: Lab

## 2012-03-13 ENCOUNTER — Ambulatory Visit (HOSPITAL_BASED_OUTPATIENT_CLINIC_OR_DEPARTMENT_OTHER): Payer: Medicare Other

## 2012-03-13 VITALS — BP 126/59 | HR 82 | Temp 97.2°F

## 2012-03-13 DIAGNOSIS — Z5111 Encounter for antineoplastic chemotherapy: Secondary | ICD-10-CM

## 2012-03-13 DIAGNOSIS — D649 Anemia, unspecified: Secondary | ICD-10-CM

## 2012-03-13 DIAGNOSIS — C9400 Acute erythroid leukemia, not having achieved remission: Secondary | ICD-10-CM

## 2012-03-13 DIAGNOSIS — D473 Essential (hemorrhagic) thrombocythemia: Secondary | ICD-10-CM

## 2012-03-13 DIAGNOSIS — I629 Nontraumatic intracranial hemorrhage, unspecified: Secondary | ICD-10-CM

## 2012-03-13 DIAGNOSIS — D72819 Decreased white blood cell count, unspecified: Secondary | ICD-10-CM

## 2012-03-13 LAB — CBC WITH DIFFERENTIAL/PLATELET
BASO%: 0.8 % (ref 0.0–2.0)
EOS%: 1.2 % (ref 0.0–7.0)
HCT: 26 % — ABNORMAL LOW (ref 34.8–46.6)
MCH: 33.9 pg (ref 25.1–34.0)
MCHC: 34.1 g/dL (ref 31.5–36.0)
NEUT%: 32.2 % — ABNORMAL LOW (ref 38.4–76.8)
RBC: 2.62 10*6/uL — ABNORMAL LOW (ref 3.70–5.45)
WBC: 1.6 10*3/uL — ABNORMAL LOW (ref 3.9–10.3)
lymph#: 1 10*3/uL (ref 0.9–3.3)

## 2012-03-13 LAB — FERRITIN: Ferritin: 410 ng/mL — ABNORMAL HIGH (ref 10–291)

## 2012-03-13 LAB — TECHNOLOGIST REVIEW

## 2012-03-13 MED ORDER — AZACITIDINE CHEMO SQ INJECTION
75.0000 mg/m2 | Freq: Once | INTRAMUSCULAR | Status: AC
  Administered 2012-03-13: 130 mg via SUBCUTANEOUS
  Filled 2012-03-13: qty 26

## 2012-03-13 MED ORDER — ONDANSETRON HCL 8 MG PO TABS
8.0000 mg | ORAL_TABLET | Freq: Once | ORAL | Status: AC
  Administered 2012-03-13: 8 mg via ORAL

## 2012-03-13 NOTE — Patient Instructions (Signed)
Call MD for any problems 

## 2012-03-14 ENCOUNTER — Other Ambulatory Visit: Payer: Self-pay | Admitting: Radiology

## 2012-03-14 ENCOUNTER — Ambulatory Visit (HOSPITAL_BASED_OUTPATIENT_CLINIC_OR_DEPARTMENT_OTHER): Payer: Medicare Other

## 2012-03-14 VITALS — BP 126/60 | HR 70 | Temp 97.6°F

## 2012-03-14 DIAGNOSIS — Z5111 Encounter for antineoplastic chemotherapy: Secondary | ICD-10-CM

## 2012-03-14 DIAGNOSIS — D649 Anemia, unspecified: Secondary | ICD-10-CM

## 2012-03-14 DIAGNOSIS — D72819 Decreased white blood cell count, unspecified: Secondary | ICD-10-CM

## 2012-03-14 DIAGNOSIS — C9400 Acute erythroid leukemia, not having achieved remission: Secondary | ICD-10-CM

## 2012-03-14 DIAGNOSIS — I629 Nontraumatic intracranial hemorrhage, unspecified: Secondary | ICD-10-CM

## 2012-03-14 DIAGNOSIS — D473 Essential (hemorrhagic) thrombocythemia: Secondary | ICD-10-CM

## 2012-03-14 MED ORDER — AZACITIDINE CHEMO SQ INJECTION
75.0000 mg/m2 | Freq: Once | INTRAMUSCULAR | Status: AC
  Administered 2012-03-14: 130 mg via SUBCUTANEOUS
  Filled 2012-03-14: qty 26

## 2012-03-14 MED ORDER — ONDANSETRON HCL 8 MG PO TABS
8.0000 mg | ORAL_TABLET | Freq: Once | ORAL | Status: AC
  Administered 2012-03-14: 8 mg via ORAL

## 2012-03-14 NOTE — Progress Notes (Signed)
Late note for 03-13-12  Patient brought in her recent billing from [facility] with questions. Copy of billing discussed with Carolynne Edouard who referred to Brandt Loosen.  Darlena to inquire with billing company and get back to patient with explanations.

## 2012-03-14 NOTE — Patient Instructions (Addendum)
Call MD for problems 

## 2012-03-17 ENCOUNTER — Ambulatory Visit (HOSPITAL_BASED_OUTPATIENT_CLINIC_OR_DEPARTMENT_OTHER): Payer: Medicare Other

## 2012-03-17 VITALS — BP 127/69 | HR 77 | Temp 97.0°F | Resp 18

## 2012-03-17 DIAGNOSIS — I629 Nontraumatic intracranial hemorrhage, unspecified: Secondary | ICD-10-CM

## 2012-03-17 DIAGNOSIS — C9400 Acute erythroid leukemia, not having achieved remission: Secondary | ICD-10-CM

## 2012-03-17 DIAGNOSIS — D72819 Decreased white blood cell count, unspecified: Secondary | ICD-10-CM

## 2012-03-17 DIAGNOSIS — D649 Anemia, unspecified: Secondary | ICD-10-CM

## 2012-03-17 DIAGNOSIS — Z5111 Encounter for antineoplastic chemotherapy: Secondary | ICD-10-CM

## 2012-03-17 DIAGNOSIS — D473 Essential (hemorrhagic) thrombocythemia: Secondary | ICD-10-CM

## 2012-03-17 MED ORDER — AZACITIDINE CHEMO SQ INJECTION
75.0000 mg/m2 | Freq: Once | INTRAMUSCULAR | Status: AC
  Administered 2012-03-17: 130 mg via SUBCUTANEOUS
  Filled 2012-03-17: qty 26

## 2012-03-17 MED ORDER — ONDANSETRON HCL 8 MG PO TABS
8.0000 mg | ORAL_TABLET | Freq: Once | ORAL | Status: AC
  Administered 2012-03-17: 8 mg via ORAL

## 2012-03-18 ENCOUNTER — Ambulatory Visit (HOSPITAL_BASED_OUTPATIENT_CLINIC_OR_DEPARTMENT_OTHER): Payer: Medicare Other

## 2012-03-18 VITALS — BP 121/64 | HR 82 | Temp 96.9°F | Resp 18

## 2012-03-18 DIAGNOSIS — D649 Anemia, unspecified: Secondary | ICD-10-CM

## 2012-03-18 DIAGNOSIS — C9400 Acute erythroid leukemia, not having achieved remission: Secondary | ICD-10-CM

## 2012-03-18 DIAGNOSIS — D473 Essential (hemorrhagic) thrombocythemia: Secondary | ICD-10-CM

## 2012-03-18 DIAGNOSIS — Z5111 Encounter for antineoplastic chemotherapy: Secondary | ICD-10-CM

## 2012-03-18 DIAGNOSIS — D72819 Decreased white blood cell count, unspecified: Secondary | ICD-10-CM

## 2012-03-18 DIAGNOSIS — I629 Nontraumatic intracranial hemorrhage, unspecified: Secondary | ICD-10-CM

## 2012-03-18 MED ORDER — ONDANSETRON HCL 8 MG PO TABS
8.0000 mg | ORAL_TABLET | Freq: Once | ORAL | Status: AC
  Administered 2012-03-18: 8 mg via ORAL

## 2012-03-18 MED ORDER — AZACITIDINE CHEMO SQ INJECTION
75.0000 mg/m2 | Freq: Once | INTRAMUSCULAR | Status: AC
  Administered 2012-03-18: 130 mg via SUBCUTANEOUS
  Filled 2012-03-18: qty 26

## 2012-03-19 ENCOUNTER — Ambulatory Visit (HOSPITAL_COMMUNITY)
Admission: RE | Admit: 2012-03-19 | Discharge: 2012-03-19 | Disposition: A | Discharge status: Home / Self Care | Payer: Medicare Other | Source: Ambulatory Visit | Attending: Oncology | Admitting: Oncology

## 2012-03-19 ENCOUNTER — Encounter (HOSPITAL_COMMUNITY): Payer: Self-pay

## 2012-03-19 DIAGNOSIS — D649 Anemia, unspecified: Secondary | ICD-10-CM | POA: Insufficient documentation

## 2012-03-19 DIAGNOSIS — Z856 Personal history of leukemia: Secondary | ICD-10-CM | POA: Insufficient documentation

## 2012-03-19 DIAGNOSIS — D696 Thrombocytopenia, unspecified: Secondary | ICD-10-CM | POA: Insufficient documentation

## 2012-03-19 DIAGNOSIS — C9400 Acute erythroid leukemia, not having achieved remission: Secondary | ICD-10-CM

## 2012-03-19 DIAGNOSIS — D72829 Elevated white blood cell count, unspecified: Secondary | ICD-10-CM | POA: Insufficient documentation

## 2012-03-19 LAB — APTT: aPTT: 32 seconds (ref 24–37)

## 2012-03-19 LAB — PROTIME-INR: Prothrombin Time: 15.6 seconds — ABNORMAL HIGH (ref 11.6–15.2)

## 2012-03-19 LAB — CBC
MCH: 32.9 pg (ref 26.0–34.0)
MCHC: 34.2 g/dL (ref 30.0–36.0)
Platelets: 1231 10*3/uL (ref 150–400)

## 2012-03-19 MED ORDER — FENTANYL CITRATE 0.05 MG/ML IJ SOLN
INTRAMUSCULAR | Status: AC | PRN
  Administered 2012-03-19: 100 ug via INTRAVENOUS

## 2012-03-19 MED ORDER — FENTANYL CITRATE 0.05 MG/ML IJ SOLN
INTRAMUSCULAR | Status: AC
  Filled 2012-03-19: qty 4

## 2012-03-19 MED ORDER — SODIUM CHLORIDE 0.9 % IV SOLN
INTRAVENOUS | Status: DC
  Administered 2012-03-19: 08:00:00 via INTRAVENOUS

## 2012-03-19 MED ORDER — MIDAZOLAM HCL 5 MG/5ML IJ SOLN
INTRAMUSCULAR | Status: AC | PRN
  Administered 2012-03-19: 1 mg via INTRAVENOUS

## 2012-03-19 MED ORDER — MIDAZOLAM HCL 2 MG/2ML IJ SOLN
INTRAMUSCULAR | Status: AC
  Filled 2012-03-19: qty 4

## 2012-03-19 MED ORDER — ONDANSETRON HCL 4 MG/2ML IJ SOLN
4.0000 mg | INTRAMUSCULAR | Status: DC | PRN
  Filled 2012-03-19: qty 2

## 2012-03-19 NOTE — Procedures (Signed)
CT guided bone marrow aspirates and biopsies. No immediate complication. 

## 2012-03-19 NOTE — H&P (Signed)
Chief Complaint: "I'm here for a repeat bone marrow biopsy" Referring Physician:Magrinat HPI: [name] is an 78 y.o. female with hx of leukopenia and thrombocytosis. She is being followed and treated by Dr. Darnelle Catalan who request repeat bone marrow biopsy. She otherwise fees good, no c/o or fevers.  Past Medical History:  Past Medical History  Diagnosis Date  . Pacemaker     left shoulder  . Cataract     had surgery  . GERD (gastroesophageal reflux disease)   . Glaucoma   . Hyperlipidemia   . Osteoporosis   . Thyroid disease   . PUD (peptic ulcer disease)   . Lichen sclerosus et atrophicus of the vulva   . Intracranial hemorrhage     Past Surgical History:  Past Surgical History  Procedure Date  . Cataract extraction w/ intraocular lens  implant, bilateral   . Dilation and curettage of uterus   . Tonsillectomy and adenoidectomy   . Pacemaker insertion     left shoulder  . Subdural hematoma evacuation via craniotomy 2005    after a fall  . Colonoscopy     Family History:  Family History  Problem Relation Age of Onset  . Colon cancer Mother     Social History:  reports that she has quit smoking. She has never used smokeless tobacco. She reports that she does not drink alcohol or use illicit drugs.  Allergies:  Allergies  Allergen Reactions  . Sulfa Antibiotics     Pt. States she may have had a reaction a long time ago    Medications: alendronate (FOSAMAX) 70 MG tablet (Taking) Sig - Route: Take 70 mg by mouth every 7 (seven) days. Take with a full glass of water on an empty stomach. Takes on Saturdays - Oral Class: Historical Med Number of times this order has been changed since signing: 1 Order [address] aspirin 325 MG EC tablet (Taking) Sig - Route: Take 325 mg by mouth daily. - Oral Class: Historical Med Number of times this order has been changed since signing: 1 Order [address] bimatoprost (LUMIGAN) 0.03 % ophthalmic solution (Taking) Sig - Route: Place 1  drop into both eyes at bedtime. - Both Eyes Class: Historical Med Number of times this order has been changed since signing: 1 Order [address] brinzolamide (AZOPT) 1 % ophthalmic suspension (Taking) Sig - Route: Place 1 drop into both eyes 2 (two) times daily. - Both Eyes Class: Historical Med Number of times this order has been changed since signing: 1 Order [address] Calcium Carbonate-Vitamin D (CALTRATE 600+D PO) (Taking) Sig - Route: Take 1 tablet by mouth 2 (two) times daily with breakfast and lunch. - Oral Class: Historical Med Number of times this order has been changed since signing: 1 Order [address] Cholecalciferol (VITAMIN D) 2000 UNITS tablet (Taking) Sig - Route: Take 2,000 Units by mouth 2 (two) times daily. - Oral Class: Historical Med Number of times this order has been changed since signing: 1 Order [address] Cyanocobalamin (VITAMIN B 12 PO) (Taking) Sig - Route: Take 2,000 mcg by mouth daily. - Oral Class: Historical Med Number of times this order has been changed since signing: 1 Order [address] levothyroxine (SYNTHROID, LEVOTHROID) 88 MCG tablet (Taking) Sig - Route: Take 88 mcg by mouth every morning. - Oral Class: Historical Med Number of times this order has been changed since signing: 2 Order [address] Multiple Vitamins-Minerals (MACULAR VITAMIN BENEFIT PO) (Taking) Sig - Route: Take 2 tablets by  mouth daily. - Oral Class: Historical Med Number of times this order has been changed since signing: 1 Order [address] nystatin cream (MYCOSTATIN) (Taking) Sig - Route: Apply 1 application topically as needed. - Topical Class: Historical Med Number of times this order has been changed since signing: 3 Order [address] OMEGA-3 KRILL OIL 300 MG CAPS (Taking) Sig - Route: Take 1 capsule by mouth daily. - Oral Class: Historical Med Number of times this order has been changed since signing: 1 Order [address] omeprazole (PRILOSEC) 20 MG capsule (Taking) Sig - Route: Take 20 mg by mouth  daily. - Oral Class: Historical Med Number of times this order has been changed since signing: 1 Order [address] pravastatin (PRAVACHOL) 40 MG tablet (Taking) Sig - Route: Take 40 mg by mouth daily. - Oral Class: Historical Med Number of times this order has been changed since signing: 1 Order [address] Zinc-Magnesium Aspart-Vit B6 (ZINC MAGNESIUM ASPARTATE PO) (Taking) Sig - Route: Take 1 tablet by mouth daily. - Oral Class: Historical Med Number of times this order has been changed since signing: 1 Order [address] folic acid (FOLVITE) 400 MCG tablet (Taking/Discontinued) 03/11/2012 Sig - Route: Take 400 mcg by mouth daily. - Oral Class: Historical Med   Please HPI for pertinent positives, otherwise complete 10 system ROS negative.  Physical Exam: There were no vitals taken for this visit. There is no height or weight on file to calculate BMI.   General Appearance:  Alert, cooperative, no distress, appears stated age  Head:  Normocephalic, without obvious abnormality, atraumatic  ENT: Unremarkable  Neck: Supple, symmetrical, trachea midline, no adenopathy, thyroid: not enlarged, symmetric, no tenderness/mass/nodules  Lungs:   Clear to auscultation bilaterally, no w/r/r, respirations unlabored without use of accessory muscles.  Chest Wall:  Pacer palpable  Heart:  Regular rate and rhythm, S1, S2 normal, no murmur, rub or gallop. Carotids 2+ without bruit.  Abdomen:   Soft, non-tender, non distended. Bowel sounds active all four quadrants,  no masses, no organomegaly.  Neurologic: Normal affect, no gross deficits.   Results for orders placed in visit on 03/13/12  CBC WITH DIFFERENTIAL      Component Value Range   WBC 1.6 (*) 3.9 - 10.3 10e3/uL   NEUT# 0.5 (*) 1.5 - 6.5 10e3/uL   HGB 8.9 (*) 11.6 - 15.9 g/dL   HCT 16.1 (*) 09.6 - 04.5 %   Platelets 1,094 (*) 145 - 400 10e3/uL   MCV 99.2  79.5 - 101.0 fL   MCH 33.9  25.1 - 34.0 pg   MCHC 34.1  31.5 - 36.0 g/dL   RBC 4.09 (*) 8.11 -  5.45 10e6/uL   RDW 21.1 (*) 11.2 - 14.5 %   lymph# 1.0  0.9 - 3.3 10e3/uL   MONO# 0.0 (*) 0.1 - 0.9 10e3/uL   Eosinophils Absolute 0.0  0.0 - 0.5 10e3/uL   Basophils Absolute 0.0  0.0 - 0.1 10e3/uL   NEUT% 32.2 (*) 38.4 - 76.8 %   LYMPH% 63.6 (*) 14.0 - 49.7 %   MONO% 2.2  0.0 - 14.0 %   EOS% 1.2  0.0 - 7.0 %   BASO% 0.8  0.0 - 2.0 %  FERRITIN      Component Value Range   Ferritin 410 (*) 10 - 291 ng/mL  TECHNOLOGIST REVIEW      Component Value Range   Technologist Review Rare blast seen       Assessment/Plan Leukopenia and thrombocytosis For repeat  CT guided BM bx Reviewed procedure including risks. Labs ok Consent signed in chart  Brayton El PA-C 03/19/2012, 8:44 AM

## 2012-03-19 NOTE — Progress Notes (Signed)
Amanda from lab called with critical lab value of 1231 for platelets. Patient pre radiology. Called Caryn Bee B. PA and informed him of results. No orders received.

## 2012-03-25 ENCOUNTER — Other Ambulatory Visit (HOSPITAL_BASED_OUTPATIENT_CLINIC_OR_DEPARTMENT_OTHER): Payer: Medicare Other

## 2012-03-25 ENCOUNTER — Ambulatory Visit (HOSPITAL_BASED_OUTPATIENT_CLINIC_OR_DEPARTMENT_OTHER): Payer: Medicare Other | Admitting: Oncology

## 2012-03-25 VITALS — BP 146/64 | HR 89 | Temp 98.2°F | Resp 20 | Ht 64.0 in | Wt 138.2 lb

## 2012-03-25 DIAGNOSIS — D649 Anemia, unspecified: Secondary | ICD-10-CM

## 2012-03-25 DIAGNOSIS — C9400 Acute erythroid leukemia, not having achieved remission: Secondary | ICD-10-CM

## 2012-03-25 DIAGNOSIS — D709 Neutropenia, unspecified: Secondary | ICD-10-CM

## 2012-03-25 DIAGNOSIS — C9200 Acute myeloblastic leukemia, not having achieved remission: Secondary | ICD-10-CM

## 2012-03-25 LAB — CBC WITH DIFFERENTIAL/PLATELET
BASO%: 0.3 % (ref 0.0–2.0)
Basophils Absolute: 0 10*3/uL (ref 0.0–0.1)
EOS%: 1.7 % (ref 0.0–7.0)
MCH: 34.2 pg — ABNORMAL HIGH (ref 25.1–34.0)
MCHC: 34.1 g/dL (ref 31.5–36.0)
MCV: 100.3 fL (ref 79.5–101.0)
MONO%: 5 % (ref 0.0–14.0)
RBC: 2.38 10*6/uL — ABNORMAL LOW (ref 3.70–5.45)
RDW: 22.3 % — ABNORMAL HIGH (ref 11.2–14.5)

## 2012-03-26 NOTE — Progress Notes (Signed)
ID: [name]   DOB: 1929/03/15  MR#: [id number]  JYN#:[id number]  HISTORY OF PRESENT ILLNESS: The patient sees Dr. [name] for routine health maintenance. On March 2013 he noted that the patient's hemoglobin was 8.7 (previously it had been 11.1). The MCV was at 107.1. The platelet count was 660,000. The white cell count was 4.2. He obtained a B12 and folate which were within normal limits at 1744 and 8.4 respectively.[name] He checked a ferritin to evaluate the thrombocytosis, and this was 151. Serum protein electrophoresis did not show an M spike and the urine protein electrophoresis was likewise negative. He referred the patient for further evaluation. 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 Since her last visit here she had a restaging bone marrow biopsy, with results as noted below.  REVIEW OF SYSTEMS: She had minimal soreness from the bone marrow biopsy, but no other complications. She is feeling "great". She denies unusual fatigue, fever, drenching sweats, rash, or bleeding problems. Her biggest issues are hearing loss and her pacemaker, which however appears to be working well. A detailed review of systems was otherwise noncontributory.  PAST MEDICAL HISTORY: Past Medical History  Diagnosis Date  . Pacemaker     left shoulder  . Cataract     had surgery  . GERD (gastroesophageal reflux disease)   . Glaucoma   . Hyperlipidemia   . Osteoporosis   . Thyroid disease   . PUD (peptic ulcer disease)   . Lichen sclerosus et atrophicus of the vulva   . Intracranial hemorrhage     PAST SURGICAL HISTORY: Past Surgical History  Procedure Date  . Cataract extraction w/ intraocular lens  implant, bilateral   . Dilation and curettage of uterus   . Tonsillectomy and adenoidectomy   . Pacemaker insertion     left shoulder  . Subdural hematoma evacuation via craniotomy 12-20-2003    after a fall  .  Colonoscopy     FAMILY HISTORY Family History  Problem Relation Age of Onset  . Colon cancer Mother    the patient's father died at the age of 69 from a myocardial infarction. Her mother was diagnosed with colon cancer at the age of 29 and died from that disease within a year. The patient had 2 sisters. One died at the age of 86 with a neck fracture. The patient has 2 brothers. There is no other history of cancer or hematologic problems in the family to her knowledge  GYNECOLOGIC HISTORY: She does not recall when she had menarche. She is GX P1, first live birth age 5. She went through the change of life in her 47s. She never took hormone replacement.  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 She had top clearance. Her husband died in 12/20/95 from a heart attack. The patient lives alone, with no pets. Her daughter, [name] lives in [place]. She has a PhD in Water quality scientist and works for [employer]. Her phone number is [phone].The patient's brother [name] lives next door and in case of an emergency she will like [name] to call him first. His phone number is [phone]   ADVANCED DIRECTIVES: in place  HEALTH MAINTENANCE: History  Substance Use Topics  . Smoking status: Former Games developer  . Smokeless tobacco: Never Used  . Alcohol Use: No     Colonoscopy: [facility]/ 12-20-10 PAP: "no longer"  Bone density: osteoporosis   Lipid panel: [name]             Mammogram: Nov 2012  Allergies  Allergen Reactions  . Sulfa Antibiotics     Pt. States she may have had a reaction a long time ago    Current Outpatient Prescriptions  Medication Sig Dispense Refill  . alendronate (FOSAMAX) 70 MG tablet Take 70 mg by mouth every 7 (seven) days. Take with a full glass of water on an empty stomach. Takes on Saturdays      . aspirin 325 MG EC tablet Take 325 mg by mouth daily.       . bimatoprost (LUMIGAN) 0.03 % ophthalmic solution Place 1 drop  into both eyes at bedtime.        . brinzolamide (AZOPT) 1 % ophthalmic suspension Place 1 drop into both eyes 2 (two) times daily.        . Calcium Carbonate-Vitamin D (CALTRATE 600+D PO) Take 1 tablet by mouth 2 (two) times daily with breakfast and lunch.        . Cholecalciferol (VITAMIN D) 2000 UNITS tablet Take 2,000 Units by mouth 2 (two) times daily.        . Cyanocobalamin (VITAMIN B 12 PO) Take 2,000 mcg by mouth daily.      . Folic Acid 20 MG CAPS Take 40 mg by mouth 2 (two) times daily.      [name] levothyroxine (SYNTHROID, LEVOTHROID) 88 MCG tablet Take 88 mcg by mouth every morning.       . Multiple Vitamins-Minerals (MACULAR VITAMIN BENEFIT PO) Take 2 tablets by mouth daily.      [name] nystatin cream (MYCOSTATIN) Apply 1 application topically as needed.       . OMEGA-3 KRILL OIL 300 MG CAPS Take 1 capsule by mouth daily.        [name] omeprazole (PRILOSEC) 20 MG capsule Take 20 mg by mouth daily.        . pravastatin (PRAVACHOL) 40 MG tablet Take 40 mg by mouth daily.        . Zinc-Magnesium Aspart-Vit B6 (ZINC MAGNESIUM ASPARTATE PO) Take 1 tablet by mouth daily.          OBJECTIVE: Elderly white woman  in no acute distress. Filed Vitals:   03/25/12 1520  BP: 146/64  Pulse: 89  Temp: 98.2 F (36.8 C)  Resp: 20     Body mass index is 23.72 kg/(m^2).    ECOG FS: 1  Filed Weights   03/25/12 1520  Weight: 138 lb 3.2 oz (62.687 kg)   Physical Exam: HEENT:  Sclerae anicteric.  Oropharynx clear.  No mucositis or candidiasis.   Nodes:  No cervical, supraclavicular, or axillary lymphadenopathy palpated.  Breast Exam:  Deferred  Lungs:  Clear to auscultation bilaterally.  No crackles, rhonchi, or wheezes.   Heart:  Regular rate and rhythm.  Pacemaker in place Abdomen:  Soft, nontender.  Positive bowel sounds.  No organomegaly palpated.   Musculoskeletal:  No focal spinal tenderness  Extremities:  No peripheral edema    Skin:  No bruises   Neuro:  Nonfocal. Alert and oriented x3.  LAB  RESULTS:   Lab Results  Component Value Date   WBC 2.6* 03/25/2012   NEUTROABS 1.1* 03/25/2012   HGB 8.2* 03/25/2012   HCT 23.9* 03/25/2012   MCV 100.3 03/25/2012   PLT 885* 03/25/2012      Chemistry      Component Value Date/Time  NA 141 02/06/2012 1047      Component Value Date/Time   CALCIUM 9.1 02/06/2012 1047       STUDIES: Patient Name: KEERAT, DENICOLA Accession #: [id number] DOB: [birth year] Age: 89 Gender: F Client Name [facility] Collected Date: 03/19/2012 Received Date: 03/19/2012 Physician: Richarda Overlie Chart #: MRN # : [id number] Physician cc: Ruthann Cancer, MD Race:W Visit #: [id number] BONE MARROW REPORT FINAL DIAGNOSIS Diagnosis Bone Marrow, Aspirate,Biopsy, and Clot, right iliac - ERYTHROID PROLIFERATION WITH DYSPOIETIC CHANGES ASSOCIATED WITH VARIABLE NUMBER OF BLASTIC CELLS. - STRIKING ATYPICAL MEGAKARYOCYTIC PROLIFERATION. - SEE COMMENT. PERIPHERAL BLOOD: - NORMOCYTIC-NORMOCHROMIC ANEMIA. - LEUKOPENIA. - MARKED THROMBOCYTOSIS. Diagnosis Note The number of blastic cells is borderline by morphology and/or flow cytometric analysis (5% or less). However, immunohistochemical stain for CD34 performed on clot and biopsy shows variable positivity ranging from <5% to 10-15% in a few small areas. As compared to previous marrow, the overall number of blastic cells appears relatively decreased in this case although morphologic changes in other myeloid cell types are similar to previous material. A t(3;3) was documented in previous bone marrow biopsy. This cytogenetic abnormality is seen in a distinct entity of AML which is usually associated with thrombocytosis and increased number of dysplastic megakaryocytes in the bone marrow as seen in this case. Hence, the overall changes in the current material favor residual disease process. Correlation with cytogenetic studies is strongly recommended. (BNS:eps 03/20/12) (BNS:caf 03/21/12) Guerry Bruin MD Pathologist,  Electronic Signature (Case signed 03/21/2012)   ASSESSMENT: 78 y.o.  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   (1) started azacytidine 01/14/2012  (2) anemia, s/p transfusion 2 units PRBCs (02/04/2012, 02/28/2012)  (3) neutropenia  PLAN:  Repeat bone marrow biopsy after 3 cycles of these as IT been shows evidence of response. In May we found approximately 22% circulating blasts, and these have largely cleared. The bone marrow blast count initially was between 10 and 15%, and now is in the 5% range. The patient's thrombocytosis is somewhat improved. She continues anemic and neutropenic.  We discussed all this, and the plan is to continue is as IgG and for 3 more cycles, and then repeat a bone marrow biopsy. We will continue to transfuse Madylin as needed. Clinically she is doing remarkably well.  I should add she brought some bills she received from the hospital, and had many questions regarding costs that I could not answer. I referred her to one of our billing counselors. Otherwise she will return in September 16 to start her fourth cycle of the is a sided. She will see [name] September 30 to set up subsequent cycles.   Felice Hope C    03/26/2012

## 2012-04-07 ENCOUNTER — Other Ambulatory Visit (HOSPITAL_BASED_OUTPATIENT_CLINIC_OR_DEPARTMENT_OTHER): Payer: Medicare Other | Admitting: Lab

## 2012-04-07 ENCOUNTER — Ambulatory Visit: Payer: Medicare Other | Admitting: Physician Assistant

## 2012-04-07 ENCOUNTER — Ambulatory Visit (HOSPITAL_BASED_OUTPATIENT_CLINIC_OR_DEPARTMENT_OTHER): Payer: Medicare Other

## 2012-04-07 ENCOUNTER — Other Ambulatory Visit: Payer: Self-pay | Admitting: *Deleted

## 2012-04-07 ENCOUNTER — Ambulatory Visit (HOSPITAL_BASED_OUTPATIENT_CLINIC_OR_DEPARTMENT_OTHER): Payer: Medicare Other | Admitting: Oncology

## 2012-04-07 ENCOUNTER — Encounter: Payer: Self-pay | Admitting: Oncology

## 2012-04-07 VITALS — BP 136/69 | HR 82 | Temp 98.1°F | Resp 20 | Ht 64.0 in | Wt 138.5 lb

## 2012-04-07 DIAGNOSIS — D709 Neutropenia, unspecified: Secondary | ICD-10-CM

## 2012-04-07 DIAGNOSIS — D649 Anemia, unspecified: Secondary | ICD-10-CM

## 2012-04-07 DIAGNOSIS — I629 Nontraumatic intracranial hemorrhage, unspecified: Secondary | ICD-10-CM

## 2012-04-07 DIAGNOSIS — C9400 Acute erythroid leukemia, not having achieved remission: Secondary | ICD-10-CM

## 2012-04-07 DIAGNOSIS — Z5111 Encounter for antineoplastic chemotherapy: Secondary | ICD-10-CM

## 2012-04-07 DIAGNOSIS — D473 Essential (hemorrhagic) thrombocythemia: Secondary | ICD-10-CM

## 2012-04-07 DIAGNOSIS — D75839 Thrombocytosis, unspecified: Secondary | ICD-10-CM

## 2012-04-07 DIAGNOSIS — D72819 Decreased white blood cell count, unspecified: Secondary | ICD-10-CM

## 2012-04-07 LAB — CBC WITH DIFFERENTIAL/PLATELET
Eosinophils Absolute: 0 10*3/uL (ref 0.0–0.5)
MCV: 102 fL — ABNORMAL HIGH (ref 79.5–101.0)
MONO%: 3.2 % (ref 0.0–14.0)
NEUT#: 0.7 10*3/uL — ABNORMAL LOW (ref 1.5–6.5)
RBC: 2.4 10*6/uL — ABNORMAL LOW (ref 3.70–5.45)
RDW: 22 % — ABNORMAL HIGH (ref 11.2–14.5)
WBC: 1.8 10*3/uL — ABNORMAL LOW (ref 3.9–10.3)

## 2012-04-07 LAB — COMPREHENSIVE METABOLIC PANEL (CC13)
AST: 15 U/L (ref 5–34)
Albumin: 3.6 g/dL (ref 3.5–5.0)
Alkaline Phosphatase: 62 U/L (ref 40–150)
Glucose: 105 mg/dl — ABNORMAL HIGH (ref 70–99)
Potassium: 5.5 mEq/L — ABNORMAL HIGH (ref 3.5–5.1)
Sodium: 140 mEq/L (ref 136–145)
Total Protein: 6.1 g/dL — ABNORMAL LOW (ref 6.4–8.3)

## 2012-04-07 MED ORDER — ONDANSETRON HCL 8 MG PO TABS
8.0000 mg | ORAL_TABLET | Freq: Once | ORAL | Status: AC
  Administered 2012-04-07: 8 mg via ORAL

## 2012-04-07 MED ORDER — AZACITIDINE CHEMO SQ INJECTION
75.0000 mg/m2 | Freq: Once | INTRAMUSCULAR | Status: AC
  Administered 2012-04-07: 130 mg via SUBCUTANEOUS
  Filled 2012-04-07: qty 26

## 2012-04-07 NOTE — Progress Notes (Signed)
ID: [name]   DOB: Jul 27, 1928  MR#: [id number]  WUJ#:[id number]  HISTORY OF PRESENT ILLNESS: The patient sees Dr. [name] for routine health maintenance. On March 2013 he noted that the patient's hemoglobin was 8.7 (previously it had been 11.1). The MCV was at 107.1. The platelet count was 660,000. The white cell count was 4.2. He obtained a B12 and folate which were within normal limits at 1744 and 8.4 respectively.[name] He checked a ferritin to evaluate the thrombocytosis, and this was 151. Serum protein electrophoresis did not show an M spike and the urine protein electrophoresis was likewise negative. He referred the patient for further evaluation. 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 Today is day 1 cycle 4 of her azacytidine treatment. She continues to maintain an excellent functional status for her age.  REVIEW OF SYSTEMS: She is tolerating therapy remarkably well, with no side effects that she reports. She has had no bleeding problems, no fevers, no rash, no pain. A detailed review of systems today is entirely negative.  PAST MEDICAL HISTORY: Past Medical History  Diagnosis Date  . Pacemaker     left shoulder  . Cataract     had surgery  . GERD (gastroesophageal reflux disease)   . Glaucoma   . Hyperlipidemia   . Osteoporosis   . Thyroid disease   . PUD (peptic ulcer disease)   . Lichen sclerosus et atrophicus of the vulva   . Intracranial hemorrhage     PAST SURGICAL HISTORY: Past Surgical History  Procedure Date  . Cataract extraction w/ intraocular lens  implant, bilateral   . Dilation and curettage of uterus   . Tonsillectomy and adenoidectomy   . Pacemaker insertion     left shoulder  . Subdural hematoma evacuation via craniotomy Jan 09, 2004    after a fall  . Colonoscopy     FAMILY HISTORY Family History  Problem Relation Age of Onset  . Colon cancer Mother    the  patient's father died at the age of 7 from a myocardial infarction. Her mother was diagnosed with colon cancer at the age of 39 and died from that disease within a year. The patient had 2 sisters. One died at the age of 49 with a neck fracture. The patient has 2 brothers. There is no other history of cancer or hematologic problems in the family to her knowledge  GYNECOLOGIC HISTORY: She does not recall when she had menarche. She is GX P1, first live birth age 16. She went through the change of life in her 55s. She never took hormone replacement.  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 Her husband died in 1996/01/09 from a heart attack. The patient lives alone, with no pets. Her daughter, [name] lives in [place]. She has a PhD in Water quality scientist and works for [employer]. Her phone number is [phone].The patient's brother [name] lives next door and in case of an emergency she will like [name] to call him first. His phone number is [phone]   ADVANCED DIRECTIVES: in place  HEALTH MAINTENANCE: History  Substance Use Topics  . Smoking status: Former Games developer  . Smokeless tobacco: Never Used  . Alcohol Use: No     Colonoscopy: [facility]/ 2011/01/09 PAP: "no longer"  Bone density: osteoporosis   Lipid panel: [name]  Mammogram: Nov 2012  Allergies  Allergen Reactions  . Sulfa Antibiotics     Pt. States she may have had a reaction a long time ago    Current Outpatient Prescriptions  Medication Sig Dispense Refill  . alendronate (FOSAMAX) 70 MG tablet Take 70 mg by mouth every 7 (seven) days. Take with a full glass of water on an empty stomach. Takes on Saturdays      . aspirin 325 MG EC tablet Take 325 mg by mouth daily.       . bimatoprost (LUMIGAN) 0.03 % ophthalmic solution Place 1 drop into both eyes at bedtime.        . brinzolamide (AZOPT) 1 % ophthalmic suspension Place 1 drop into both eyes 2  (two) times daily.        . Calcium Carbonate-Vitamin D (CALTRATE 600+D PO) Take 1 tablet by mouth 2 (two) times daily with breakfast and lunch.        . Cholecalciferol (VITAMIN D) 2000 UNITS tablet Take 2,000 Units by mouth 2 (two) times daily.        . Cyanocobalamin (VITAMIN B 12 PO) Take 2,000 mcg by mouth daily.      . Folic Acid 20 MG CAPS Take 40 mg by mouth 2 (two) times daily.      [name] levothyroxine (SYNTHROID, LEVOTHROID) 88 MCG tablet Take 88 mcg by mouth every morning.       . Multiple Vitamins-Minerals (MACULAR VITAMIN BENEFIT PO) Take 2 tablets by mouth daily.      [name] nystatin cream (MYCOSTATIN) Apply 1 application topically as needed.       . OMEGA-3 KRILL OIL 300 MG CAPS Take 1 capsule by mouth daily.        [name] omeprazole (PRILOSEC) 20 MG capsule Take 20 mg by mouth daily.        . pravastatin (PRAVACHOL) 40 MG tablet Take 40 mg by mouth daily.        . Zinc-Magnesium Aspart-Vit B6 (ZINC MAGNESIUM ASPARTATE PO) Take 1 tablet by mouth daily.          OBJECTIVE: Elderly white woman  in no acute distress. Filed Vitals:   04/07/12 1432  BP: 136/69  Pulse: 82  Temp: 98.1 F (36.7 C)  Resp: 20     Body mass index is 23.77 kg/(m^2).    ECOG FS: 1  Filed Weights   04/07/12 1432  Weight: 138 lb 8 oz (62.823 kg)   Physical Exam: HEENT:  Sclerae anicteric.  Oropharynx clear.  Nodes:  No cervical or supraclavicular lymphadenopathy  Breast Exam:  Deferred  Lungs:  Clear to auscultation bilaterally.  Heart:  Regular rate and rhythm.  Pacemaker in place Abdomen:  Soft, nontender.  Positive bowel sounds.  No organomegaly palpated.   Musculoskeletal:  No focal spinal tenderness  Extremities:  No peripheral edema    Skin:  No bruises   Neuro:  Nonfocal. Alert and oriented x3.  LAB RESULTS:   Lab Results  Component Value Date   WBC 1.8* 04/07/2012   NEUTROABS 0.7* 04/07/2012   HGB 8.3* 04/07/2012   HCT 24.5* 04/07/2012   MCV 102.0* 04/07/2012   PLT 866* 04/07/2012       Chemistry      Component Value Date/Time   NA 141 02/06/2012 1047      Component Value Date/Time   CALCIUM 9.1 02/06/2012 1047       STUDIES: Patient Name: [name], [name] Accession #: [id number] DOB: 03/19/1929 Age:  78 Gender: F Client Name [facility] Collected Date: 03/19/2012 Received Date: 03/19/2012 Physician: Richarda Overlie Chart #: MRN # : [id number] Physician cc: Ruthann Cancer, MD Race:W Visit #: [id number] BONE MARROW REPORT FINAL DIAGNOSIS Diagnosis Bone Marrow, Aspirate,Biopsy, and Clot, right iliac - ERYTHROID PROLIFERATION WITH DYSPOIETIC CHANGES ASSOCIATED WITH VARIABLE NUMBER OF BLASTIC CELLS. - STRIKING ATYPICAL MEGAKARYOCYTIC PROLIFERATION. - SEE Conner. PERIPHERAL BLOOD: - NORMOCYTIC-NORMOCHROMIC ANEMIA. - LEUKOPENIA. - MARKED THROMBOCYTOSIS. Diagnosis Note The number of blastic cells is borderline by morphology and/or flow cytometric analysis (5% or less). However, immunohistochemical stain for CD34 performed on clot and biopsy shows variable positivity ranging from <5% to 10-15% in a few small areas. As compared to previous marrow, the overall number of blastic cells appears relatively decreased in this case although morphologic changes in other myeloid cell types are similar to previous material. A t(3;3) was documented in previous bone marrow biopsy. This cytogenetic abnormality is seen in a distinct entity of AML which is usually associated with thrombocytosis and increased number of dysplastic megakaryocytes in the bone marrow as seen in this case. Hence, the overall changes in the current material favor residual disease process. Correlation with cytogenetic studies is strongly recommended. (BNS:eps 03/20/12) (BNS:caf 03/21/12) Guerry Bruin MD Pathologist, Electronic Signature (Case signed 03/21/2012)   ASSESSMENT: 78 y.o.  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   (1) started azacytidine 01/14/2012  (2) anemia, s/p transfusion 2 units PRBCs (02/04/2012, 02/28/2012)  (3) neutropenia  PLAN:  Patient is tolerating treatment well and we are proceeding to cycle 4. The plan is to do a total of 6 cycles and then repeat a bone marrow biopsy. She is aware of the need to call for any fever or bleeding problems. At present, despite her significant anemia, she has no symptoms mandating transfusion  Nakyia is very concerned about her bills. This includes not only the bills she may or may not have to pay, but some discrepancies in billing. I have referred her to our billing specialists and hopefully they can answer some of her questions, which seemed to me entirely on the mark. She will let me know if those answers are not satisfactory.   MAGRINAT,GUSTAV C    04/07/2012

## 2012-04-07 NOTE — Progress Notes (Signed)
Called Ms. Clearance Coots x2  per Dr. Darnelle Catalan to discuss billing concerns. No answer, left message to call me back.

## 2012-04-07 NOTE — Patient Instructions (Signed)
[facility] Discharge Instructions for Patients Receiving Chemotherapy  Today you received the following chemotherapy agents Vidaza  To help prevent nausea and vomiting after your treatment, we encourage you to take your nausea medication as directed by MD.  If you develop nausea and vomiting that is not controlled by your nausea medication, call the clinic. If it is after clinic hours your family physician or the after hours number for the clinic or go to the Emergency Department.   BELOW ARE SYMPTOMS THAT SHOULD BE REPORTED IMMEDIATELY:  *FEVER GREATER THAN 100.5 F  *CHILLS WITH OR WITHOUT FEVER  NAUSEA AND VOMITING THAT IS NOT CONTROLLED WITH YOUR NAUSEA MEDICATION  *UNUSUAL SHORTNESS OF BREATH  *UNUSUAL BRUISING OR BLEEDING  TENDERNESS IN MOUTH AND THROAT WITH OR WITHOUT PRESENCE OF ULCERS  *URINARY PROBLEMS  *BOWEL PROBLEMS  UNUSUAL RASH Items with * indicate a potential emergency and should be followed up as soon as possible.  One of the nurses will contact you 24 hours after your treatment. Please let the nurse know about any problems that you may have experienced. Feel free to call the clinic you have any questions or concerns. The clinic phone number is [phone].   I have been informed and understand all the instructions given to me. I know to contact the clinic, my physician, or go to the Emergency Department if any problems should occur. I do not have any questions at this time, but understand that I may call the clinic during office hours   should I have any questions or need assistance in obtaining follow up care.    __________________________________________  _____________  __________ Signature of Patient or Authorized Representative            Date                   Time    __________________________________________ Nurse's Signature

## 2012-04-08 ENCOUNTER — Ambulatory Visit (HOSPITAL_BASED_OUTPATIENT_CLINIC_OR_DEPARTMENT_OTHER): Payer: Medicare Other

## 2012-04-08 VITALS — BP 126/40 | HR 68 | Temp 98.1°F | Resp 16

## 2012-04-08 DIAGNOSIS — D72819 Decreased white blood cell count, unspecified: Secondary | ICD-10-CM

## 2012-04-08 DIAGNOSIS — D473 Essential (hemorrhagic) thrombocythemia: Secondary | ICD-10-CM

## 2012-04-08 DIAGNOSIS — I629 Nontraumatic intracranial hemorrhage, unspecified: Secondary | ICD-10-CM

## 2012-04-08 DIAGNOSIS — D649 Anemia, unspecified: Secondary | ICD-10-CM

## 2012-04-08 DIAGNOSIS — Z5111 Encounter for antineoplastic chemotherapy: Secondary | ICD-10-CM

## 2012-04-08 DIAGNOSIS — C9400 Acute erythroid leukemia, not having achieved remission: Secondary | ICD-10-CM

## 2012-04-08 MED ORDER — ONDANSETRON HCL 8 MG PO TABS
8.0000 mg | ORAL_TABLET | Freq: Once | ORAL | Status: AC
  Administered 2012-04-08: 8 mg via ORAL

## 2012-04-08 MED ORDER — AZACITIDINE CHEMO SQ INJECTION
75.0000 mg/m2 | Freq: Once | INTRAMUSCULAR | Status: AC
  Administered 2012-04-08: 130 mg via SUBCUTANEOUS
  Filled 2012-04-08: qty 26

## 2012-04-08 NOTE — Progress Notes (Unsigned)
Vidaza given in three separate injections today right abdomen, upper, lower and middle.  Tolerated well.

## 2012-04-09 ENCOUNTER — Ambulatory Visit (HOSPITAL_BASED_OUTPATIENT_CLINIC_OR_DEPARTMENT_OTHER): Payer: Medicare Other

## 2012-04-09 VITALS — BP 119/43 | HR 82 | Temp 98.1°F | Resp 18

## 2012-04-09 DIAGNOSIS — I629 Nontraumatic intracranial hemorrhage, unspecified: Secondary | ICD-10-CM

## 2012-04-09 DIAGNOSIS — Z5111 Encounter for antineoplastic chemotherapy: Secondary | ICD-10-CM

## 2012-04-09 DIAGNOSIS — D473 Essential (hemorrhagic) thrombocythemia: Secondary | ICD-10-CM

## 2012-04-09 DIAGNOSIS — C9400 Acute erythroid leukemia, not having achieved remission: Secondary | ICD-10-CM

## 2012-04-09 DIAGNOSIS — D72819 Decreased white blood cell count, unspecified: Secondary | ICD-10-CM

## 2012-04-09 DIAGNOSIS — D649 Anemia, unspecified: Secondary | ICD-10-CM

## 2012-04-09 MED ORDER — AZACITIDINE CHEMO SQ INJECTION
75.0000 mg/m2 | Freq: Once | INTRAMUSCULAR | Status: AC
  Administered 2012-04-09: 130 mg via SUBCUTANEOUS
  Filled 2012-04-09: qty 26

## 2012-04-09 MED ORDER — ONDANSETRON HCL 8 MG PO TABS
8.0000 mg | ORAL_TABLET | Freq: Once | ORAL | Status: AC
  Administered 2012-04-09: 8 mg via ORAL

## 2012-04-09 NOTE — Patient Instructions (Addendum)
AZACITIDINE (ay za SITE i deen) is a chemotherapy drug. This medicine reduces the growth of cancer cells and can suppress the immune system. It is used for treating myelodysplastic syndrome or some types of leukemia. This medicine may be used for other purposes; ask your health care provider or pharmacist if you have questions. What should I tell my health care provider before I take this medicine? They need to know if you have any of these conditions: -infection (especially a virus infection such as chickenpox, cold sores, or herpes) -kidney disease -liver disease -liver tumors -an unusual or allergic reaction to azacitidine, mannitol, other medicines, foods, dyes, or preservatives -pregnant or trying to get pregnant -breast-feeding How should I use this medicine? This medicine is for injection under the skin. It is administered in a hospital or clinic by a specially trained health care professional. Talk to your pediatrician regarding the use of this medicine in children. While this drug may be prescribed for selected conditions, precautions do apply. Overdosage: If you think you have taken too much of this medicine contact a poison control center or emergency room at once. NOTE: This medicine is only for you. Do not share this medicine with others. What if I miss a dose? It is important not to miss your dose. Call your doctor or health care professional if you are unable to keep an appointment. What may interact with this medicine? -vaccines Talk to your doctor or health care professional before taking any of these medicines: -acetaminophen -aspirin -ibuprofen -ketoprofen -naproxen This list may not describe all possible interactions. Give your health care provider a list of all the medicines, herbs, non-prescription drugs, or dietary supplements you use. Also tell them if you smoke, drink alcohol, or use illegal drugs. Some items may interact with your medicine. What should I watch for  while using this medicine? Visit your doctor for checks on your progress. This drug may make you feel generally unwell. This is not uncommon, as chemotherapy can affect healthy cells as well as cancer cells. Report any side effects. Continue your course of treatment even though you feel ill unless your doctor tells you to stop. In some cases, you may be given additional medicines to help with side effects. Follow all directions for their use. Call your doctor or health care professional for advice if you get a fever, chills or sore throat, or other symptoms of a cold or flu. Do not treat yourself. This drug decreases your body's ability to fight infections. Try to avoid being around people who are sick. This medicine may increase your risk to bruise or bleed. Call your doctor or health care professional if you notice any unusual bleeding. Be careful brushing and flossing your teeth or using a toothpick because you may get an infection or bleed more easily. If you have any dental work done, tell your dentist you are receiving this medicine. Avoid taking products that contain aspirin, acetaminophen, ibuprofen, naproxen, or ketoprofen unless instructed by your doctor. These medicines may hide a fever. Do not have any vaccinations without your doctor's approval and avoid anyone who has recently had oral polio vaccine. Do not become pregnant while taking this medicine. Women should inform their doctor if they wish to become pregnant or think they might be pregnant. There is a potential for serious side effects to an unborn child. Talk to your health care professional or pharmacist for more information. Do not breast-feed an infant while taking this medicine. If you are a man,  you should not father a child while receiving treatment. What side effects may I notice from receiving this medicine? Side effects that you should report to your doctor or health care professional as soon as possible: -allergic reactions  like skin rash, itching or hives, swelling of the face, lips, or tongue -low blood counts - this medicine may decrease the number of white blood cells, red blood cells and platelets. You may be at increased risk for infections and bleeding. -signs of infection - fever or chills, cough, sore throat, pain or difficulty passing urine -signs of decreased platelets or bleeding - bruising, pinpoint red spots on the skin, black, tarry stools, blood in the urine -signs of decreased red blood cells - unusually weak or tired, fainting spells, lightheadedness -reactions at the injection site including redness, pain, itching, or bruising -breathing problems -changes in vision -fever -mouth sores -stomach pain -vomiting Side effects that usually do not require medical attention (report to your doctor or health care professional if they continue or are bothersome): -constipation -diarrhea -loss of appetite -nausea -pain or redness at the injection site -weak or tired This list may not describe all possible side effects. Call your doctor for medical advice about side effects. You may report side effects to FDA at 1-800-FDA-1088. Where should I keep my medicine? This drug is given in a hospital or clinic and will not be stored at home. NOTE: This sheet is a summary. It may not cover all possible information. If you have questions about this medicine, talk to your doctor, pharmacist, or health care provider.  2012, [employer]. (10/02/2007 11:04:07 AM)

## 2012-04-10 ENCOUNTER — Ambulatory Visit (HOSPITAL_BASED_OUTPATIENT_CLINIC_OR_DEPARTMENT_OTHER): Payer: Medicare Other

## 2012-04-10 DIAGNOSIS — C9400 Acute erythroid leukemia, not having achieved remission: Secondary | ICD-10-CM

## 2012-04-10 DIAGNOSIS — Z5111 Encounter for antineoplastic chemotherapy: Secondary | ICD-10-CM

## 2012-04-10 DIAGNOSIS — D473 Essential (hemorrhagic) thrombocythemia: Secondary | ICD-10-CM

## 2012-04-10 DIAGNOSIS — D72819 Decreased white blood cell count, unspecified: Secondary | ICD-10-CM

## 2012-04-10 DIAGNOSIS — D649 Anemia, unspecified: Secondary | ICD-10-CM

## 2012-04-10 DIAGNOSIS — I629 Nontraumatic intracranial hemorrhage, unspecified: Secondary | ICD-10-CM

## 2012-04-10 MED ORDER — ONDANSETRON HCL 8 MG PO TABS
8.0000 mg | ORAL_TABLET | Freq: Once | ORAL | Status: AC
  Administered 2012-04-10: 8 mg via ORAL

## 2012-04-10 MED ORDER — AZACITIDINE CHEMO SQ INJECTION
75.0000 mg/m2 | Freq: Once | INTRAMUSCULAR | Status: AC
  Administered 2012-04-10: 130 mg via SUBCUTANEOUS
  Filled 2012-04-10: qty 26

## 2012-04-10 NOTE — Patient Instructions (Signed)
Call MD for problems 

## 2012-04-11 ENCOUNTER — Ambulatory Visit (HOSPITAL_BASED_OUTPATIENT_CLINIC_OR_DEPARTMENT_OTHER): Payer: Medicare Other

## 2012-04-11 VITALS — BP 113/51 | HR 69 | Temp 97.8°F

## 2012-04-11 DIAGNOSIS — D473 Essential (hemorrhagic) thrombocythemia: Secondary | ICD-10-CM

## 2012-04-11 DIAGNOSIS — D649 Anemia, unspecified: Secondary | ICD-10-CM

## 2012-04-11 DIAGNOSIS — D72819 Decreased white blood cell count, unspecified: Secondary | ICD-10-CM

## 2012-04-11 DIAGNOSIS — I629 Nontraumatic intracranial hemorrhage, unspecified: Secondary | ICD-10-CM

## 2012-04-11 DIAGNOSIS — C9400 Acute erythroid leukemia, not having achieved remission: Secondary | ICD-10-CM

## 2012-04-11 DIAGNOSIS — Z5111 Encounter for antineoplastic chemotherapy: Secondary | ICD-10-CM

## 2012-04-11 MED ORDER — ONDANSETRON HCL 8 MG PO TABS
8.0000 mg | ORAL_TABLET | Freq: Once | ORAL | Status: AC
  Administered 2012-04-11: 8 mg via ORAL

## 2012-04-11 MED ORDER — AZACITIDINE CHEMO SQ INJECTION
75.0000 mg/m2 | Freq: Once | INTRAMUSCULAR | Status: AC
  Administered 2012-04-11: 130 mg via SUBCUTANEOUS
  Filled 2012-04-11: qty 26

## 2012-04-11 NOTE — Progress Notes (Signed)
Patient here for Vidaza 3 injections given left upper,mid, and lower abdomen.  Tolerated well.

## 2012-04-14 ENCOUNTER — Ambulatory Visit (HOSPITAL_BASED_OUTPATIENT_CLINIC_OR_DEPARTMENT_OTHER): Payer: Medicare Other

## 2012-04-14 VITALS — BP 119/70 | HR 80 | Temp 97.8°F | Resp 20

## 2012-04-14 DIAGNOSIS — D72819 Decreased white blood cell count, unspecified: Secondary | ICD-10-CM

## 2012-04-14 DIAGNOSIS — Z5111 Encounter for antineoplastic chemotherapy: Secondary | ICD-10-CM

## 2012-04-14 DIAGNOSIS — D649 Anemia, unspecified: Secondary | ICD-10-CM

## 2012-04-14 DIAGNOSIS — C9400 Acute erythroid leukemia, not having achieved remission: Secondary | ICD-10-CM

## 2012-04-14 DIAGNOSIS — I629 Nontraumatic intracranial hemorrhage, unspecified: Secondary | ICD-10-CM

## 2012-04-14 DIAGNOSIS — D473 Essential (hemorrhagic) thrombocythemia: Secondary | ICD-10-CM

## 2012-04-14 MED ORDER — AZACITIDINE CHEMO SQ INJECTION
75.0000 mg/m2 | Freq: Once | INTRAMUSCULAR | Status: AC
Start: 2012-04-14 — End: 2012-04-14
  Administered 2012-04-14: 130 mg via SUBCUTANEOUS
  Filled 2012-04-14: qty 5.2

## 2012-04-14 MED ORDER — ONDANSETRON HCL 8 MG PO TABS
8.0000 mg | ORAL_TABLET | Freq: Once | ORAL | Status: AC
  Administered 2012-04-14: 8 mg via ORAL

## 2012-04-15 ENCOUNTER — Ambulatory Visit (HOSPITAL_BASED_OUTPATIENT_CLINIC_OR_DEPARTMENT_OTHER): Payer: Medicare Other

## 2012-04-15 VITALS — BP 118/70 | HR 74 | Temp 97.8°F | Resp 18

## 2012-04-15 DIAGNOSIS — D473 Essential (hemorrhagic) thrombocythemia: Secondary | ICD-10-CM

## 2012-04-15 DIAGNOSIS — C9400 Acute erythroid leukemia, not having achieved remission: Secondary | ICD-10-CM

## 2012-04-15 DIAGNOSIS — Z5111 Encounter for antineoplastic chemotherapy: Secondary | ICD-10-CM

## 2012-04-15 DIAGNOSIS — D72819 Decreased white blood cell count, unspecified: Secondary | ICD-10-CM

## 2012-04-15 DIAGNOSIS — I629 Nontraumatic intracranial hemorrhage, unspecified: Secondary | ICD-10-CM

## 2012-04-15 DIAGNOSIS — D649 Anemia, unspecified: Secondary | ICD-10-CM

## 2012-04-15 MED ORDER — AZACITIDINE CHEMO SQ INJECTION
75.0000 mg/m2 | Freq: Once | INTRAMUSCULAR | Status: AC
  Administered 2012-04-15: 130 mg via SUBCUTANEOUS
  Filled 2012-04-15: qty 26

## 2012-04-15 MED ORDER — ONDANSETRON HCL 8 MG PO TABS
8.0000 mg | ORAL_TABLET | Freq: Once | ORAL | Status: AC
  Administered 2012-04-15: 8 mg via ORAL

## 2012-04-21 ENCOUNTER — Encounter: Payer: Self-pay | Admitting: Physician Assistant

## 2012-04-21 ENCOUNTER — Other Ambulatory Visit: Payer: Self-pay | Admitting: *Deleted

## 2012-04-21 ENCOUNTER — Ambulatory Visit (HOSPITAL_BASED_OUTPATIENT_CLINIC_OR_DEPARTMENT_OTHER): Payer: Medicare Other | Admitting: Physician Assistant

## 2012-04-21 ENCOUNTER — Telehealth: Payer: Self-pay | Admitting: Oncology

## 2012-04-21 ENCOUNTER — Other Ambulatory Visit (HOSPITAL_BASED_OUTPATIENT_CLINIC_OR_DEPARTMENT_OTHER): Payer: Medicare Other | Admitting: Lab

## 2012-04-21 VITALS — BP 119/64 | HR 75 | Temp 98.0°F | Resp 20 | Ht 64.0 in | Wt 136.6 lb

## 2012-04-21 DIAGNOSIS — C9400 Acute erythroid leukemia, not having achieved remission: Secondary | ICD-10-CM

## 2012-04-21 LAB — COMPREHENSIVE METABOLIC PANEL (CC13)
ALT: 18 U/L (ref 0–55)
CO2: 24 mEq/L (ref 22–29)
Calcium: 9.1 mg/dL (ref 8.4–10.4)
Chloride: 108 mEq/L — ABNORMAL HIGH (ref 98–107)
Glucose: 92 mg/dl (ref 70–99)
Sodium: 141 mEq/L (ref 136–145)
Total Bilirubin: 0.5 mg/dL (ref 0.20–1.20)
Total Protein: 5.9 g/dL — ABNORMAL LOW (ref 6.4–8.3)

## 2012-04-21 LAB — CBC WITH DIFFERENTIAL/PLATELET
Eosinophils Absolute: 0.1 10*3/uL (ref 0.0–0.5)
HCT: 24.7 % — ABNORMAL LOW (ref 34.8–46.6)
LYMPH%: 41.9 % (ref 14.0–49.7)
MONO#: 0.1 10*3/uL (ref 0.1–0.9)
NEUT#: 1.2 10*3/uL — ABNORMAL LOW (ref 1.5–6.5)
NEUT%: 48.4 % (ref 38.4–76.8)
Platelets: 796 10*3/uL — ABNORMAL HIGH (ref 145–400)
WBC: 2.5 10*3/uL — ABNORMAL LOW (ref 3.9–10.3)
lymph#: 1 10*3/uL (ref 0.9–3.3)

## 2012-04-21 NOTE — Telephone Encounter (Signed)
gve the pt her oct,nov 2013 appt calendar. °

## 2012-04-21 NOTE — Progress Notes (Signed)
ID: [name]   DOB: [birth year]  MR#: [id number]  WUJ#:[id number]  HISTORY OF PRESENT ILLNESS: The patient sees Dr. [name] for routine health maintenance. On March 2013 he noted that the patient's hemoglobin was 8.7 (previously it had been 11.1). The MCV was at 107.1. The platelet count was 660,000. The white cell count was 4.2. He obtained a B12 and folate which were within normal limits at 1744 and 8.4 respectively.[name] He checked a ferritin to evaluate the thrombocytosis, and this was 151. Serum protein electrophoresis did not show an M spike and the urine protein electrophoresis was likewise negative. He referred the patient for further evaluation. 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 Today is day 15 cycle 4 of her azacytidine treatment. She continues to maintain an excellent functional status for her age and tells me she is performing approximately 90% of her normal daily activities. She continues to walk approximately 30 minutes every day.  REVIEW OF SYSTEMS: [name] has no new complaints or concerns today. She denies any fevers, chills, or night sweats. She's eating and drinking well with no nausea or change in bowel habits. No signs of abnormal bleeding. No cough, increased shortness of breath, or chest pain. No abnormal headaches or dizziness. No increased swelling or unusual myalgias or arthralgias.  In fact a detailed review of systems is otherwise noncontributory.  PAST MEDICAL HISTORY: Past Medical History  Diagnosis Date  . Pacemaker     left shoulder  . Cataract     had surgery  . GERD (gastroesophageal reflux disease)   . Glaucoma   . Hyperlipidemia   . Osteoporosis   . Thyroid disease   . PUD (peptic ulcer disease)   . Lichen sclerosus et atrophicus of the vulva   . Intracranial hemorrhage     PAST SURGICAL HISTORY: Past Surgical History  Procedure Date  . Cataract  extraction w/ intraocular lens  implant, bilateral   . Dilation and curettage of uterus   . Tonsillectomy and adenoidectomy   . Pacemaker insertion     left shoulder  . Subdural hematoma evacuation via craniotomy Jan 08, 2004    after a fall  . Colonoscopy     FAMILY HISTORY Family History  Problem Relation Age of Onset  . Colon cancer Mother    the patient's father died at the age of 41 from a myocardial infarction. Her mother was diagnosed with colon cancer at the age of 30 and died from that disease within a year. The patient had 2 sisters. One died at the age of 41 with a neck fracture. The patient has 2 brothers. There is no other history of cancer or hematologic problems in the family to her knowledge  GYNECOLOGIC HISTORY: She does not recall when she had menarche. She is GX P1, first live birth age 66. She went through the change of life in her 16s. She never took hormone replacement.  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 Her husband died in 08-Jan-1996 from a heart attack. The patient lives alone, with no pets. Her daughter, [name] lives in [place]. She has a PhD in Water quality scientist and works for [employer]. Her phone number is [phone].The patient's brother [name] lives next door and in case of an emergency she will like [name] to call him first. His phone number is [phone]   ADVANCED  DIRECTIVES: in place  HEALTH MAINTENANCE: History  Substance Use Topics  . Smoking status: Former Games developer  . Smokeless tobacco: Never Used  . Alcohol Use: No     Colonoscopy: [facility]/ May 2012  PAP: "no longer"  Bone density: osteoporosis   Lipid panel: [name]             Mammogram: Nov 2012  Allergies  Allergen Reactions  . Sulfa Antibiotics     Pt. States she may have had a reaction a long time ago    Current Outpatient Prescriptions  Medication Sig Dispense Refill  . alendronate (FOSAMAX) 70 MG tablet  Take 70 mg by mouth every 7 (seven) days. Take with a full glass of water on an empty stomach. Takes on Saturdays      . aspirin 325 MG EC tablet Take 325 mg by mouth daily.       . bimatoprost (LUMIGAN) 0.03 % ophthalmic solution Place 1 drop into both eyes at bedtime.        . brinzolamide (AZOPT) 1 % ophthalmic suspension Place 1 drop into both eyes 2 (two) times daily.        . Calcium Carbonate-Vitamin D (CALTRATE 600+D PO) Take 1 tablet by mouth 2 (two) times daily with breakfast and lunch.        . Cholecalciferol (VITAMIN D) 2000 UNITS tablet Take 2,000 Units by mouth 2 (two) times daily.        . Cyanocobalamin (VITAMIN B 12 PO) Take 2,000 mcg by mouth daily.      . Folic Acid 20 MG CAPS Take 40 mg by mouth 2 (two) times daily.      [name] levothyroxine (SYNTHROID, LEVOTHROID) 88 MCG tablet Take 88 mcg by mouth every morning.       . Multiple Vitamins-Minerals (MACULAR VITAMIN BENEFIT PO) Take 2 tablets by mouth daily.      [name] nystatin cream (MYCOSTATIN) Apply 1 application topically as needed.       . OMEGA-3 KRILL OIL 300 MG CAPS Take 1 capsule by mouth daily.        [name] omeprazole (PRILOSEC) 20 MG capsule Take 20 mg by mouth daily.        . pravastatin (PRAVACHOL) 40 MG tablet Take 40 mg by mouth daily.        . Zinc-Magnesium Aspart-Vit B6 (ZINC MAGNESIUM ASPARTATE PO) Take 1 tablet by mouth daily.          OBJECTIVE: Elderly white woman  in no acute distress. Filed Vitals:   04/21/12 1134  BP: 119/64  Pulse: 75  Temp: 98 F (36.7 C)  Resp: 20     Body mass index is 23.45 kg/(m^2).    ECOG FS: 1  Filed Weights   04/21/12 1134  Weight: 136 lb 9.6 oz (61.961 kg)   Physical Exam: HEENT:  Sclerae anicteric.  Oropharynx clear.  Nodes:  No cervical or supraclavicular lymphadenopathy  Breast Exam:  Deferred  Lungs:  Clear to auscultation bilaterally.  Heart:  Regular rate and rhythm.  Pacemaker in place Abdomen:  Soft, nontender.  Positive bowel sounds.  Musculoskeletal:  No focal  spinal tenderness to palpation Extremities:  No peripheral edema    Neuro:  Nonfocal. Alert and oriented x3.  LAB RESULTS:   Lab Results  Component Value Date   WBC 2.5* 04/21/2012   NEUTROABS 1.2* 04/21/2012   HGB 8.7* 04/21/2012   HCT 24.7* 04/21/2012   MCV 104.0* 04/21/2012   PLT 796* 04/21/2012  Chemistry      Component Value Date/Time   NA 141 04/21/2012 1115   NA 141 02/06/2012 1047      Component Value Date/Time   CALCIUM 9.1 04/21/2012 1115   CALCIUM 9.1 02/06/2012 1047       STUDIES: Patient Name: [name], [name] Accession #: [id number] DOB: 1928-09-24 Age: 78 Gender: F Client Name [facility] Collected Date: 03/19/2012 Received Date: 03/19/2012 Physician: Richarda Overlie Chart #: MRN # : [id number] Physician cc: Ruthann Cancer, MD Race:W Visit #: [id number] BONE MARROW REPORT FINAL DIAGNOSIS Diagnosis Bone Marrow, Aspirate,Biopsy, and Clot, right iliac - ERYTHROID PROLIFERATION WITH DYSPOIETIC CHANGES ASSOCIATED WITH VARIABLE NUMBER OF BLASTIC CELLS. - STRIKING ATYPICAL MEGAKARYOCYTIC PROLIFERATION. - SEE Conner. PERIPHERAL BLOOD: - NORMOCYTIC-NORMOCHROMIC ANEMIA. - LEUKOPENIA. - MARKED THROMBOCYTOSIS. Diagnosis Note The number of blastic cells is borderline by morphology and/or flow cytometric analysis (5% or less). However, immunohistochemical stain for CD34 performed on clot and biopsy shows variable positivity ranging from <5% to 10-15% in a few small areas. As compared to previous marrow, the overall number of blastic cells appears relatively decreased in this case although morphologic changes in other myeloid cell types are similar to previous material. A t(3;3) was documented in previous bone marrow biopsy. This cytogenetic abnormality is seen in a distinct entity of AML which is usually associated with thrombocytosis and increased number of dysplastic megakaryocytes in the bone marrow as seen in this case. Hence, the overall changes in the  current material favor residual disease process. Correlation with cytogenetic studies is strongly recommended. (BNS:eps 03/20/12) (BNS:caf 03/21/12) Guerry Bruin MD Pathologist, Electronic Signature (Case signed 03/21/2012)   ASSESSMENT: 78 y.o.  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   (1) started azacytidine 01/14/2012  (2) anemia, s/p transfusion 2 units PRBCs (02/04/2012, 02/28/2012)  (3) neutropenia  PLAN:  [name] continues to tolerate treatment remarkably well, and will return in 2 weeks on October 14 to initiate her fifth cycle. The plan is to complete 6 cycles, then repeat a bone marrow biopsy. She voices understanding and agreement with our plan, and will call with any changes or problems prior to her next scheduled appointment.   [name]    04/21/2012

## 2012-05-05 ENCOUNTER — Other Ambulatory Visit (HOSPITAL_BASED_OUTPATIENT_CLINIC_OR_DEPARTMENT_OTHER): Payer: Medicare Other | Admitting: Lab

## 2012-05-05 ENCOUNTER — Ambulatory Visit (HOSPITAL_BASED_OUTPATIENT_CLINIC_OR_DEPARTMENT_OTHER): Payer: Medicare Other | Admitting: Physician Assistant

## 2012-05-05 ENCOUNTER — Encounter: Payer: Self-pay | Admitting: Physician Assistant

## 2012-05-05 ENCOUNTER — Ambulatory Visit (HOSPITAL_BASED_OUTPATIENT_CLINIC_OR_DEPARTMENT_OTHER): Payer: Medicare Other

## 2012-05-05 VITALS — BP 130/72 | HR 76 | Temp 97.7°F | Resp 20 | Ht 64.0 in | Wt 139.3 lb

## 2012-05-05 DIAGNOSIS — D72819 Decreased white blood cell count, unspecified: Secondary | ICD-10-CM

## 2012-05-05 DIAGNOSIS — C9400 Acute erythroid leukemia, not having achieved remission: Secondary | ICD-10-CM

## 2012-05-05 DIAGNOSIS — D75839 Thrombocytosis, unspecified: Secondary | ICD-10-CM

## 2012-05-05 DIAGNOSIS — D649 Anemia, unspecified: Secondary | ICD-10-CM

## 2012-05-05 DIAGNOSIS — D709 Neutropenia, unspecified: Secondary | ICD-10-CM

## 2012-05-05 DIAGNOSIS — D473 Essential (hemorrhagic) thrombocythemia: Secondary | ICD-10-CM

## 2012-05-05 DIAGNOSIS — I629 Nontraumatic intracranial hemorrhage, unspecified: Secondary | ICD-10-CM

## 2012-05-05 DIAGNOSIS — Z5111 Encounter for antineoplastic chemotherapy: Secondary | ICD-10-CM

## 2012-05-05 LAB — COMPREHENSIVE METABOLIC PANEL (CC13)
Albumin: 3.5 g/dL (ref 3.5–5.0)
Alkaline Phosphatase: 54 U/L (ref 40–150)
BUN: 20 mg/dL (ref 7.0–26.0)
Calcium: 9.3 mg/dL (ref 8.4–10.4)
Chloride: 111 mEq/L — ABNORMAL HIGH (ref 98–107)
Creatinine: 0.7 mg/dL (ref 0.6–1.1)
Glucose: 98 mg/dl (ref 70–99)
Potassium: 4.1 mEq/L (ref 3.5–5.1)

## 2012-05-05 LAB — CBC WITH DIFFERENTIAL/PLATELET
Basophils Absolute: 0 10*3/uL (ref 0.0–0.1)
Eosinophils Absolute: 0 10*3/uL (ref 0.0–0.5)
MCH: 34.7 pg — ABNORMAL HIGH (ref 25.1–34.0)
MCHC: 33.5 g/dL (ref 31.5–36.0)
MONO%: 1.4 % (ref 0.0–14.0)
NEUT%: 45.3 % (ref 38.4–76.8)
Platelets: 649 10*3/uL — ABNORMAL HIGH (ref 145–400)
RBC: 2.68 10*6/uL — ABNORMAL LOW (ref 3.70–5.45)
RDW: 17.6 % — ABNORMAL HIGH (ref 11.2–14.5)
WBC: 2.1 10*3/uL — ABNORMAL LOW (ref 3.9–10.3)

## 2012-05-05 MED ORDER — AZACITIDINE CHEMO SQ INJECTION
75.0000 mg/m2 | Freq: Once | INTRAMUSCULAR | Status: AC
  Administered 2012-05-05: 130 mg via SUBCUTANEOUS
  Filled 2012-05-05: qty 26

## 2012-05-05 MED ORDER — ONDANSETRON HCL 8 MG PO TABS
8.0000 mg | ORAL_TABLET | Freq: Once | ORAL | Status: AC
  Administered 2012-05-05: 8 mg via ORAL

## 2012-05-05 NOTE — Progress Notes (Signed)
ID: [name]   DOB: 1928-12-30  MR#: [id number]  FAO#:[id number]  HISTORY OF PRESENT ILLNESS: The patient sees [name] for routine health maintenance. On March 2013 he noted that the patient's hemoglobin was 8.7 (previously it had been 11.1). The MCV was at 107.1. The platelet count was 660,000. The white cell count was 4.2. He obtained a B12 and folate which were within normal limits at 1744 and 8.4 respectively.[name] He checked a ferritin to evaluate the thrombocytosis, and this was 151. Serum protein electrophoresis did not show an M spike and the urine protein electrophoresis was likewise negative. He referred the patient for further evaluation. 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 Today is day 1 cycle 5 of her azacytidine treatment. She continues to maintain an excellent functional status for her age and tells me she is performing at least 90% of her normal daily activities. She feels well, "does not feel sick", and continues to walk approximately 30 minutes every day.  REVIEW OF SYSTEMS: [name] has no new complaints or concerns today. She denies any fevers, chills, or night sweats. No rashes, skin changes, or pruritis. She's eating and drinking well with no nausea or change in bowel habits. No signs of abnormal bleeding. No cough, increased shortness of breath, or chest pain. No abnormal headaches or dizziness. No increased swelling or unusual myalgias or arthralgias.  In fact a detailed review of systems is otherwise noncontributory.  PAST MEDICAL HISTORY: Past Medical History  Diagnosis Date  . Pacemaker     left shoulder  . Cataract     had surgery  . GERD (gastroesophageal reflux disease)   . Glaucoma   . Hyperlipidemia   . Osteoporosis   . Thyroid disease   . PUD (peptic ulcer disease)   . Lichen sclerosus et atrophicus of the vulva   . Intracranial hemorrhage     PAST  SURGICAL HISTORY: Past Surgical History  Procedure Date  . Cataract extraction w/ intraocular lens  implant, bilateral   . Dilation and curettage of uterus   . Tonsillectomy and adenoidectomy   . Pacemaker insertion     left shoulder  . Subdural hematoma evacuation via craniotomy Jan 04, 2004    after a fall  . Colonoscopy     FAMILY HISTORY Family History  Problem Relation Age of Onset  . Colon Conner Mother    the patient's father died at the age of 13 from a myocardial infarction. Her mother was diagnosed with colon Conner at the age of 80 and died from that disease within a year. The patient had 2 sisters. One died at the age of 3 with a neck fracture. The patient has 2 brothers. There is no other history of Conner or hematologic problems in the family to her knowledge  GYNECOLOGIC HISTORY: She does not recall when she had menarche. She is GX P1, first live birth age 89. She went through the change of life in her 4s. She never took hormone replacement.  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 Her husband died in 01/04/1996 from a heart attack. The patient lives alone, with no pets. Her daughter, [name] lives in [place]. She has a PhD in Water quality scientist and works for [employer]. Her phone number is [phone].The patient's brother [name] lives next door and in case of an emergency she will  like [name] to call him first. His phone number is [phone]   ADVANCED DIRECTIVES: in place  HEALTH MAINTENANCE: History  Substance Use Topics  . Smoking status: Former Games developer  . Smokeless tobacco: Never Used  . Alcohol Use: No     Colonoscopy: [name]/ May 2012  PAP: "no longer"  Bone density: osteoporosis   Lipid panel: [name]             Mammogram: Nov 2012  Allergies  Allergen Reactions  . Sulfa Antibiotics     Pt. States she may have had a reaction a long time ago    Current Outpatient Prescriptions    Medication Sig Dispense Refill  . alendronate (FOSAMAX) 70 MG tablet Take 70 mg by mouth every 7 (seven) days. Take with a full glass of water on an empty stomach. Takes on Saturdays      . aspirin 325 MG EC tablet Take 325 mg by mouth daily.       . bimatoprost (LUMIGAN) 0.03 % ophthalmic solution Place 1 drop into both eyes at bedtime.        . brinzolamide (AZOPT) 1 % ophthalmic suspension Place 1 drop into both eyes 2 (two) times daily.        . Calcium Carbonate-Vitamin D (CALTRATE 600+D PO) Take 1 tablet by mouth 2 (two) times daily with breakfast and lunch.        . Cholecalciferol (VITAMIN D) 2000 UNITS tablet Take 2,000 Units by mouth 2 (two) times daily.        . Cyanocobalamin (VITAMIN B 12 PO) Take 2,000 mcg by mouth daily.      . Folic Acid 20 MG CAPS Take 40 mg by mouth 2 (two) times daily.      [name] levothyroxine (SYNTHROID, LEVOTHROID) 88 MCG tablet Take 88 mcg by mouth every morning.       . Multiple Vitamins-Minerals (MACULAR VITAMIN BENEFIT PO) Take 2 tablets by mouth daily.      [name] nystatin cream (MYCOSTATIN) Apply 1 application topically as needed.       . OMEGA-3 KRILL OIL 300 MG CAPS Take 1 capsule by mouth daily.        [name] omeprazole (PRILOSEC) 20 MG capsule Take 20 mg by mouth daily.        . pravastatin (PRAVACHOL) 40 MG tablet Take 40 mg by mouth daily.        . Zinc-Magnesium Aspart-Vit B6 (ZINC MAGNESIUM ASPARTATE PO) Take 1 tablet by mouth daily.         No current facility-administered medications for this visit.   Facility-Administered Medications Ordered in Other Visits  Medication Dose Route Frequency Provider Last Rate Last Dose  . azaCITIDine (VIDAZA) chemo injection 130 mg  75 mg/m2 (Treatment Plan Actual) Subcutaneous Once [name] Chloe Grana, PA      . ondansetron (ZOFRAN) tablet 8 mg  8 mg Oral Once Chloe Gravel, PA   8 mg at 05/05/12 1524    OBJECTIVE: Elderly white woman  in no acute distress. Filed Vitals:   05/05/12 1449  BP: 130/72  Pulse: 76  Temp: 97.7  F (36.5 C)  Resp: 20     Body mass index is 23.91 kg/(m^2).    ECOG FS: 0  Filed Weights   05/05/12 1449  Weight: 139 lb 4.8 oz (63.186 kg)   Physical Exam: HEENT:  Sclerae anicteric.  Oropharynx clear.  Nodes:  No cervical or supraclavicular lymphadenopathy  Breast Exam:  Deferred  Lungs:  Clear to auscultation bilaterally.  Heart:  Regular rate and rhythm.  Pacemaker in place Abdomen:  Soft, nontender.  Positive bowel sounds.  Musculoskeletal:  No focal spinal tenderness to palpation Extremities:  Nonpitting pedal edema, equal bilaterally. No edema in the upper extremities.    Neuro:  Nonfocal. Alert and oriented x3.  LAB RESULTS:   Lab Results  Component Value Date   WBC 2.1* 05/05/2012   NEUTROABS 1.0* 05/05/2012   HGB 9.3* 05/05/2012   HCT 27.8* 05/05/2012   MCV 103.7* 05/05/2012   PLT 649* 05/05/2012      Chemistry      Component Value Date/Time   NA 142 05/05/2012 1439   NA 141 02/06/2012 1047      Component Value Date/Time   CALCIUM 9.3 05/05/2012 1439   CALCIUM 9.1 02/06/2012 1047       STUDIES: Patient Name: [name], [name] Accession #: [id number] DOB: [birth year] Age: 7 Gender: F Client Name [facility] Collected Date: 03/19/2012 Received Date: 03/19/2012 Physician: [name] Chart #: MRN # : [id number] Physician cc: Chloe Cancer, MD Race:W Visit #: [id number] BONE MARROW REPORT FINAL DIAGNOSIS Diagnosis Bone Marrow, Aspirate,Biopsy, and Clot, right iliac - ERYTHROID PROLIFERATION WITH DYSPOIETIC CHANGES ASSOCIATED WITH VARIABLE NUMBER OF BLASTIC CELLS. - STRIKING ATYPICAL MEGAKARYOCYTIC PROLIFERATION. - SEE Conner. PERIPHERAL BLOOD: - NORMOCYTIC-NORMOCHROMIC ANEMIA. - LEUKOPENIA. - MARKED THROMBOCYTOSIS. Diagnosis Note The number of blastic cells is borderline by morphology and/or flow cytometric analysis (5% or less). However, immunohistochemical stain for CD34 performed on clot and biopsy shows variable positivity ranging  from <5% to 10-15% in a few small areas. As compared to previous marrow, the overall number of blastic cells appears relatively decreased in this case although morphologic changes in other myeloid cell types are similar to previous material. A t(3;3) was documented in previous bone marrow biopsy. This cytogenetic abnormality is seen in a distinct entity of AML which is usually associated with thrombocytosis and increased number of dysplastic megakaryocytes in the bone marrow as seen in this case. Hence, the overall changes in the current material favor residual disease process. Correlation with cytogenetic studies is strongly recommended. (BNS:eps 03/20/12) (BNS:caf 03/21/12) Guerry Bruin MD Pathologist, Electronic Signature (Case signed 03/21/2012)   ASSESSMENT: 78 y.o.  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   (1) started azacytidine 01/14/2012  (2) anemia, s/p transfusion 2 units PRBCs (02/04/2012, 02/28/2012)  (3) neutropenia  PLAN:  [name] will proceed to treatment today as scheduled for day 1 cycle 5 of 5 days. She will receive injections daily this week, 10/14 through 1018. She return on Monday, 10/21, for followup with Dr. Darnelle Catalan, and will also receive injections on 10/21 and 10/22. The plan is to complete 6 cycles, then repeat a bone marrow biopsy. She voices understanding and agreement with our plan, and will call with any changes or problems prior to her next scheduled appointment.   Deshone Lyssy    05/05/2012

## 2012-05-06 ENCOUNTER — Ambulatory Visit (HOSPITAL_BASED_OUTPATIENT_CLINIC_OR_DEPARTMENT_OTHER): Payer: Medicare Other

## 2012-05-06 VITALS — BP 128/54 | HR 72 | Temp 97.6°F

## 2012-05-06 DIAGNOSIS — Z5111 Encounter for antineoplastic chemotherapy: Secondary | ICD-10-CM

## 2012-05-06 DIAGNOSIS — D72819 Decreased white blood cell count, unspecified: Secondary | ICD-10-CM

## 2012-05-06 DIAGNOSIS — D473 Essential (hemorrhagic) thrombocythemia: Secondary | ICD-10-CM

## 2012-05-06 DIAGNOSIS — C9400 Acute erythroid leukemia, not having achieved remission: Secondary | ICD-10-CM

## 2012-05-06 DIAGNOSIS — D649 Anemia, unspecified: Secondary | ICD-10-CM

## 2012-05-06 DIAGNOSIS — I629 Nontraumatic intracranial hemorrhage, unspecified: Secondary | ICD-10-CM

## 2012-05-06 MED ORDER — ONDANSETRON HCL 8 MG PO TABS
8.0000 mg | ORAL_TABLET | Freq: Once | ORAL | Status: AC
  Administered 2012-05-06: 8 mg via ORAL

## 2012-05-06 MED ORDER — AZACITIDINE CHEMO SQ INJECTION
75.0000 mg/m2 | Freq: Once | INTRAMUSCULAR | Status: AC
  Administered 2012-05-06: 130 mg via SUBCUTANEOUS
  Filled 2012-05-06: qty 26

## 2012-05-06 NOTE — Patient Instructions (Signed)
[facility] Discharge Instructions for Patients Receiving Chemotherapy  Today you received the following chemotherapy agents Vidaza  To help prevent nausea and vomiting after your treatment, we encourage you to take your nausea medication as prescribed.    If you develop nausea and vomiting that is not controlled by your nausea medication, call the clinic. If it is after clinic hours your family physician or the after hours number for the clinic or go to the Emergency Department.   BELOW ARE SYMPTOMS THAT SHOULD BE REPORTED IMMEDIATELY:  *FEVER GREATER THAN 100.5 F  *CHILLS WITH OR WITHOUT FEVER  NAUSEA AND VOMITING THAT IS NOT CONTROLLED WITH YOUR NAUSEA MEDICATION  *UNUSUAL SHORTNESS OF BREATH  *UNUSUAL BRUISING OR BLEEDING  TENDERNESS IN MOUTH AND THROAT WITH OR WITHOUT PRESENCE OF ULCERS  *URINARY PROBLEMS  *BOWEL PROBLEMS  UNUSUAL RASH Items with * indicate a potential emergency and should be followed up as soon as possible.  One of the nurses will contact you 24 hours after your treatment. Please let the nurse know about any problems that you may have experienced. Feel free to call the clinic you have any questions or concerns. The clinic phone number is [phone].   I have been informed and understand all the instructions given to me. I know to contact the clinic, my physician, or go to the Emergency Department if any problems should occur. I do not have any questions at this time, but understand that I may call the clinic during office hours   should I have any questions or need assistance in obtaining follow up care.    __________________________________________  _____________  __________ Signature of Patient or Authorized Representative            Date                   Time    __________________________________________ Nurse's Signature    

## 2012-05-07 ENCOUNTER — Ambulatory Visit (HOSPITAL_BASED_OUTPATIENT_CLINIC_OR_DEPARTMENT_OTHER): Payer: Medicare Other

## 2012-05-07 ENCOUNTER — Other Ambulatory Visit: Payer: Self-pay | Admitting: *Deleted

## 2012-05-07 VITALS — BP 121/69 | HR 60 | Temp 97.6°F

## 2012-05-07 DIAGNOSIS — C9400 Acute erythroid leukemia, not having achieved remission: Secondary | ICD-10-CM

## 2012-05-07 DIAGNOSIS — I629 Nontraumatic intracranial hemorrhage, unspecified: Secondary | ICD-10-CM

## 2012-05-07 DIAGNOSIS — D473 Essential (hemorrhagic) thrombocythemia: Secondary | ICD-10-CM

## 2012-05-07 DIAGNOSIS — D649 Anemia, unspecified: Secondary | ICD-10-CM

## 2012-05-07 DIAGNOSIS — D72819 Decreased white blood cell count, unspecified: Secondary | ICD-10-CM

## 2012-05-07 DIAGNOSIS — D75839 Thrombocytosis, unspecified: Secondary | ICD-10-CM

## 2012-05-07 DIAGNOSIS — Z5111 Encounter for antineoplastic chemotherapy: Secondary | ICD-10-CM

## 2012-05-07 MED ORDER — ONDANSETRON HCL 8 MG PO TABS
8.0000 mg | ORAL_TABLET | Freq: Once | ORAL | Status: AC
  Administered 2012-05-07: 8 mg via ORAL

## 2012-05-07 MED ORDER — AZACITIDINE CHEMO SQ INJECTION
75.0000 mg/m2 | Freq: Once | INTRAMUSCULAR | Status: AC
  Administered 2012-05-07: 130 mg via SUBCUTANEOUS
  Filled 2012-05-07: qty 26

## 2012-05-07 NOTE — Patient Instructions (Addendum)
[facility] Discharge Instructions for Patients Receiving Chemotherapy  Today you received the following chemotherapy agents :  Vidaza.  To help prevent nausea and vomiting after your treatment, we encourage you to take your nausea medication as instructed by your physician.    If you develop nausea and vomiting that is not controlled by your nausea medication, call the clinic. If it is after clinic hours your family physician or the after hours number for the clinic or go to the Emergency Department.   BELOW ARE SYMPTOMS THAT SHOULD BE REPORTED IMMEDIATELY:  *FEVER GREATER THAN 100.5 F  *CHILLS WITH OR WITHOUT FEVER  NAUSEA AND VOMITING THAT IS NOT CONTROLLED WITH YOUR NAUSEA MEDICATION  *UNUSUAL SHORTNESS OF BREATH  *UNUSUAL BRUISING OR BLEEDING  TENDERNESS IN MOUTH AND THROAT WITH OR WITHOUT PRESENCE OF ULCERS  *URINARY PROBLEMS  *BOWEL PROBLEMS  UNUSUAL RASH Items with * indicate a potential emergency and should be followed up as soon as possible.  One of the nurses will contact you 24 hours after your treatment. Please let the nurse know about any problems that you may have experienced. Feel free to call the clinic you have any questions or concerns. The clinic phone number is [phone].   I have been informed and understand all the instructions given to me. I know to contact the clinic, my physician, or go to the Emergency Department if any problems should occur. I do not have any questions at this time, but understand that I may call the clinic during office hours   should I have any questions or need assistance in obtaining follow up care.    __________________________________________  _____________  __________ Signature of Patient or Authorized Representative            Date                   Time    __________________________________________ Nurse's Signature    

## 2012-05-08 ENCOUNTER — Ambulatory Visit (HOSPITAL_BASED_OUTPATIENT_CLINIC_OR_DEPARTMENT_OTHER): Payer: Medicare Other

## 2012-05-08 VITALS — BP 124/59 | HR 65 | Temp 97.6°F

## 2012-05-08 DIAGNOSIS — Z5111 Encounter for antineoplastic chemotherapy: Secondary | ICD-10-CM

## 2012-05-08 DIAGNOSIS — D649 Anemia, unspecified: Secondary | ICD-10-CM

## 2012-05-08 DIAGNOSIS — I629 Nontraumatic intracranial hemorrhage, unspecified: Secondary | ICD-10-CM

## 2012-05-08 DIAGNOSIS — D473 Essential (hemorrhagic) thrombocythemia: Secondary | ICD-10-CM

## 2012-05-08 DIAGNOSIS — C9400 Acute erythroid leukemia, not having achieved remission: Secondary | ICD-10-CM

## 2012-05-08 DIAGNOSIS — D72819 Decreased white blood cell count, unspecified: Secondary | ICD-10-CM

## 2012-05-08 MED ORDER — AZACITIDINE CHEMO SQ INJECTION
75.0000 mg/m2 | Freq: Once | INTRAMUSCULAR | Status: AC
Start: 2012-05-08 — End: 2012-05-08
  Administered 2012-05-08: 130 mg via SUBCUTANEOUS
  Filled 2012-05-08: qty 26

## 2012-05-08 MED ORDER — ONDANSETRON HCL 8 MG PO TABS
8.0000 mg | ORAL_TABLET | Freq: Once | ORAL | Status: AC
  Administered 2012-05-08: 8 mg via ORAL

## 2012-05-08 NOTE — Patient Instructions (Addendum)
[facility] Discharge Instructions for Patients Receiving Chemotherapy  Today you received the following chemotherapy agents Vidaza  To help prevent nausea and vomiting after your treatment, we encourage you to take your nausea medication as prescribed.    If you develop nausea and vomiting that is not controlled by your nausea medication, call the clinic. If it is after clinic hours your family physician or the after hours number for the clinic or go to the Emergency Department.   BELOW ARE SYMPTOMS THAT SHOULD BE REPORTED IMMEDIATELY:  *FEVER GREATER THAN 100.5 F  *CHILLS WITH OR WITHOUT FEVER  NAUSEA AND VOMITING THAT IS NOT CONTROLLED WITH YOUR NAUSEA MEDICATION  *UNUSUAL SHORTNESS OF BREATH  *UNUSUAL BRUISING OR BLEEDING  TENDERNESS IN MOUTH AND THROAT WITH OR WITHOUT PRESENCE OF ULCERS  *URINARY PROBLEMS  *BOWEL PROBLEMS  UNUSUAL RASH Items with * indicate a potential emergency and should be followed up as soon as possible.  One of the nurses will contact you 24 hours after your treatment. Please let the nurse know about any problems that you may have experienced. Feel free to call the clinic you have any questions or concerns. The clinic phone number is [phone].   I have been informed and understand all the instructions given to me. I know to contact the clinic, my physician, or go to the Emergency Department if any problems should occur. I do not have any questions at this time, but understand that I may call the clinic during office hours   should I have any questions or need assistance in obtaining follow up care.    __________________________________________  _____________  __________ Signature of Patient or Authorized Representative            Date                   Time    __________________________________________ Nurse's Signature    

## 2012-05-09 ENCOUNTER — Ambulatory Visit (HOSPITAL_BASED_OUTPATIENT_CLINIC_OR_DEPARTMENT_OTHER): Payer: Medicare Other

## 2012-05-09 VITALS — BP 108/59 | HR 67 | Temp 97.6°F

## 2012-05-09 DIAGNOSIS — I629 Nontraumatic intracranial hemorrhage, unspecified: Secondary | ICD-10-CM

## 2012-05-09 DIAGNOSIS — D75839 Thrombocytosis, unspecified: Secondary | ICD-10-CM

## 2012-05-09 DIAGNOSIS — D649 Anemia, unspecified: Secondary | ICD-10-CM

## 2012-05-09 DIAGNOSIS — C9400 Acute erythroid leukemia, not having achieved remission: Secondary | ICD-10-CM

## 2012-05-09 DIAGNOSIS — Z5111 Encounter for antineoplastic chemotherapy: Secondary | ICD-10-CM

## 2012-05-09 DIAGNOSIS — D72819 Decreased white blood cell count, unspecified: Secondary | ICD-10-CM

## 2012-05-09 DIAGNOSIS — D473 Essential (hemorrhagic) thrombocythemia: Secondary | ICD-10-CM

## 2012-05-09 MED ORDER — AZACITIDINE CHEMO SQ INJECTION
75.0000 mg/m2 | Freq: Once | INTRAMUSCULAR | Status: AC
  Administered 2012-05-09: 130 mg via SUBCUTANEOUS
  Filled 2012-05-09: qty 26

## 2012-05-09 MED ORDER — ONDANSETRON HCL 8 MG PO TABS
8.0000 mg | ORAL_TABLET | Freq: Once | ORAL | Status: AC
  Administered 2012-05-09: 8 mg via ORAL

## 2012-05-09 NOTE — Patient Instructions (Addendum)
[facility] Discharge Instructions for Patients Receiving Chemotherapy  Today you received the following chemotherapy agents Vidaza  To help prevent nausea and vomiting after your treatment, we encourage you to take your nausea medication as prescribed.    If you develop nausea and vomiting that is not controlled by your nausea medication, call the clinic. If it is after clinic hours your family physician or the after hours number for the clinic or go to the Emergency Department.   BELOW ARE SYMPTOMS THAT SHOULD BE REPORTED IMMEDIATELY:  *FEVER GREATER THAN 100.5 F  *CHILLS WITH OR WITHOUT FEVER  NAUSEA AND VOMITING THAT IS NOT CONTROLLED WITH YOUR NAUSEA MEDICATION  *UNUSUAL SHORTNESS OF BREATH  *UNUSUAL BRUISING OR BLEEDING  TENDERNESS IN MOUTH AND THROAT WITH OR WITHOUT PRESENCE OF ULCERS  *URINARY PROBLEMS  *BOWEL PROBLEMS  UNUSUAL RASH Items with * indicate a potential emergency and should be followed up as soon as possible.  One of the nurses will contact you 24 hours after your treatment. Please let the nurse know about any problems that you may have experienced. Feel free to call the clinic you have any questions or concerns. The clinic phone number is [phone].   I have been informed and understand all the instructions given to me. I know to contact the clinic, my physician, or go to the Emergency Department if any problems should occur. I do not have any questions at this time, but understand that I may call the clinic during office hours   should I have any questions or need assistance in obtaining follow up care.    __________________________________________  _____________  __________ Signature of Patient or Authorized Representative            Date                   Time    __________________________________________ Nurse's Signature    

## 2012-05-12 ENCOUNTER — Ambulatory Visit (HOSPITAL_BASED_OUTPATIENT_CLINIC_OR_DEPARTMENT_OTHER): Payer: Medicare Other | Admitting: Oncology

## 2012-05-12 ENCOUNTER — Other Ambulatory Visit (HOSPITAL_BASED_OUTPATIENT_CLINIC_OR_DEPARTMENT_OTHER): Payer: Medicare Other | Admitting: Lab

## 2012-05-12 ENCOUNTER — Ambulatory Visit (HOSPITAL_BASED_OUTPATIENT_CLINIC_OR_DEPARTMENT_OTHER): Payer: Medicare Other

## 2012-05-12 VITALS — BP 121/71 | HR 77 | Temp 97.9°F | Resp 20 | Ht 64.0 in | Wt 139.7 lb

## 2012-05-12 DIAGNOSIS — C9400 Acute erythroid leukemia, not having achieved remission: Secondary | ICD-10-CM

## 2012-05-12 DIAGNOSIS — Z5111 Encounter for antineoplastic chemotherapy: Secondary | ICD-10-CM

## 2012-05-12 DIAGNOSIS — D649 Anemia, unspecified: Secondary | ICD-10-CM

## 2012-05-12 DIAGNOSIS — D709 Neutropenia, unspecified: Secondary | ICD-10-CM

## 2012-05-12 LAB — COMPREHENSIVE METABOLIC PANEL (CC13)
ALT: 15 U/L (ref 0–55)
AST: 17 U/L (ref 5–34)
Albumin: 3.3 g/dL — ABNORMAL LOW (ref 3.5–5.0)
Alkaline Phosphatase: 57 U/L (ref 40–150)
BUN: 24 mg/dL (ref 7.0–26.0)
CO2: 23 mEq/L (ref 22–29)
Calcium: 9 mg/dL (ref 8.4–10.4)
Chloride: 111 mEq/L — ABNORMAL HIGH (ref 98–107)
Creatinine: 0.7 mg/dL (ref 0.6–1.1)
Glucose: 64 mg/dl — ABNORMAL LOW (ref 70–99)
Potassium: 4 mEq/L (ref 3.5–5.1)
Sodium: 142 mEq/L (ref 136–145)
Total Bilirubin: 0.3 mg/dL (ref 0.20–1.20)
Total Protein: 5.9 g/dL — ABNORMAL LOW (ref 6.4–8.3)

## 2012-05-12 LAB — CBC WITH DIFFERENTIAL/PLATELET
BASO%: 1.4 % (ref 0.0–2.0)
Basophils Absolute: 0 10*3/uL (ref 0.0–0.1)
EOS%: 2.2 % (ref 0.0–7.0)
MCH: 36.7 pg — ABNORMAL HIGH (ref 25.1–34.0)
MCHC: 34.7 g/dL (ref 31.5–36.0)
MCV: 105.9 fL — ABNORMAL HIGH (ref 79.5–101.0)
MONO%: 5.2 % (ref 0.0–14.0)
RDW: 18.2 % — ABNORMAL HIGH (ref 11.2–14.5)
lymph#: 1 10*3/uL (ref 0.9–3.3)

## 2012-05-12 MED ORDER — AZACITIDINE CHEMO SQ INJECTION
75.0000 mg/m2 | Freq: Once | INTRAMUSCULAR | Status: AC
  Administered 2012-05-12: 130 mg via SUBCUTANEOUS
  Filled 2012-05-12: qty 26

## 2012-05-12 MED ORDER — ONDANSETRON HCL 8 MG PO TABS
8.0000 mg | ORAL_TABLET | Freq: Once | ORAL | Status: AC
  Administered 2012-05-12: 8 mg via ORAL

## 2012-05-12 NOTE — Patient Instructions (Addendum)
[facility] Discharge Instructions for Patients Receiving Chemotherapy  Today you received the following chemotherapy agents Vidaza  To help prevent nausea and vomiting after your treatment, we encourage you to take your nausea medication as prescribed.    If you develop nausea and vomiting that is not controlled by your nausea medication, call the clinic. If it is after clinic hours your family physician or the after hours number for the clinic or go to the Emergency Department.   BELOW ARE SYMPTOMS THAT SHOULD BE REPORTED IMMEDIATELY:  *FEVER GREATER THAN 100.5 F  *CHILLS WITH OR WITHOUT FEVER  NAUSEA AND VOMITING THAT IS NOT CONTROLLED WITH YOUR NAUSEA MEDICATION  *UNUSUAL SHORTNESS OF BREATH  *UNUSUAL BRUISING OR BLEEDING  TENDERNESS IN MOUTH AND THROAT WITH OR WITHOUT PRESENCE OF ULCERS  *URINARY PROBLEMS  *BOWEL PROBLEMS  UNUSUAL RASH Items with * indicate a potential emergency and should be followed up as soon as possible.  One of the nurses will contact you 24 hours after your treatment. Please let the nurse know about any problems that you may have experienced. Feel free to call the clinic you have any questions or concerns. The clinic phone number is [phone].   I have been informed and understand all the instructions given to me. I know to contact the clinic, my physician, or go to the Emergency Department if any problems should occur. I do not have any questions at this time, but understand that I may call the clinic during office hours   should I have any questions or need assistance in obtaining follow up care.    __________________________________________  _____________  __________ Signature of Patient or Authorized Representative            Date                   Time    __________________________________________ Nurse's Signature    

## 2012-05-12 NOTE — Progress Notes (Signed)
ID: [name]   DOB: 1929/02/12  MR#: [id number]  ZHY#:[id number]  HISTORY OF PRESENT ILLNESS: The patient sees Dr. [name] for routine health maintenance. On March 2013 he noted that the patient's hemoglobin was 8.7 (previously it had been 11.1). The MCV was at 107.1. The platelet count was 660,000. The white cell count was 4.2. He obtained a B12 and folate which were within normal limits at 1744 and 8.4 respectively.[name] He checked a ferritin to evaluate the thrombocytosis, and this was 151. Serum protein electrophoresis did not show an M spike and the urine protein electrophoresis was likewise negative. He referred the patient for further evaluation. 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 Today is day 8 cycle 5 of her azacytidine treatment. The interval history is unremarkable. Specifically she has had no fevers, bleeding, rash, unexplained fatigue or unexplained weakness. She maintains an excellent functional status all around.   REVIEW OF SYSTEMS: She complains of low hearing, but "feels great". A detailed review of systems today was absolutely noncontributory. Her main concern is that she keeps getting very strange bills that she cannot understand she has made multiple phone call to try to figure out who is responsible for what but so far she has not received any adequate responses.  PAST MEDICAL HISTORY: Past Medical History  Diagnosis Date  . Pacemaker     left shoulder  . Cataract     had surgery  . GERD (gastroesophageal reflux disease)   . Glaucoma   . Hyperlipidemia   . Osteoporosis   . Thyroid disease   . PUD (peptic ulcer disease)   . Lichen sclerosus et atrophicus of the vulva   . Intracranial hemorrhage     PAST SURGICAL HISTORY: Past Surgical History  Procedure Date  . Cataract extraction w/ intraocular lens  implant, bilateral   . Dilation and curettage of uterus   .  Tonsillectomy and adenoidectomy   . Pacemaker insertion     left shoulder  . Subdural hematoma evacuation via craniotomy 2003-12-26    after a fall  . Colonoscopy     FAMILY HISTORY Family History  Problem Relation Age of Onset  . Colon cancer Mother    the patient's father died at the age of 34 from a myocardial infarction. Her mother was diagnosed with colon cancer at the age of 63 and died from that disease within a year. The patient had 2 sisters. One died at the age of 27 with a neck fracture. The patient has 2 brothers. There is no other history of cancer or hematologic problems in the family to her knowledge  GYNECOLOGIC HISTORY: She does not recall when she had menarche. She is GX P1, first live birth age 64. She went through the change of life in her 57s. She never took hormone replacement.  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 Her husband died in 1995/12/26 from a heart attack. The patient lives alone, with no pets. Her daughter, [name] lives in [place]. She has a PhD in Water quality scientist and works for [employer]. Her phone number is [phone].The patient's brother [name] lives next door and in case of an emergency she will like [name] to call him first. His phone number is [phone]   ADVANCED DIRECTIVES: in place  HEALTH MAINTENANCE: History  Substance Use Topics  . Smoking status:  Former Smoker  . Smokeless tobacco: Never Used  . Alcohol Use: No     Colonoscopy: [facility]/ May 2012  PAP: "no longer"  Bone density: osteoporosis   Lipid panel: [name]             Mammogram: Nov 2012  Allergies  Allergen Reactions  . Sulfa Antibiotics     Pt. States she may have had a reaction a long time ago    Current Outpatient Prescriptions  Medication Sig Dispense Refill  . alendronate (FOSAMAX) 70 MG tablet Take 70 mg by mouth every 7 (seven) days. Take with a full glass of water on an empty stomach.  Takes on Saturdays      . aspirin 325 MG EC tablet Take 325 mg by mouth daily.       . bimatoprost (LUMIGAN) 0.03 % ophthalmic solution Place 1 drop into both eyes at bedtime.        . brinzolamide (AZOPT) 1 % ophthalmic suspension Place 1 drop into both eyes 2 (two) times daily.        . Calcium Carbonate-Vitamin D (CALTRATE 600+D PO) Take 1 tablet by mouth 2 (two) times daily with breakfast and lunch.        . Cholecalciferol (VITAMIN D) 2000 UNITS tablet Take 2,000 Units by mouth 2 (two) times daily.        . Cyanocobalamin (VITAMIN B 12 PO) Take 2,000 mcg by mouth daily.      . Folic Acid 20 MG CAPS Take 40 mg by mouth 2 (two) times daily.      [name] levothyroxine (SYNTHROID, LEVOTHROID) 88 MCG tablet Take 88 mcg by mouth every morning.       . Multiple Vitamins-Minerals (MACULAR VITAMIN BENEFIT PO) Take 2 tablets by mouth daily.      [name] nystatin cream (MYCOSTATIN) Apply 1 application topically as needed.       . OMEGA-3 KRILL OIL 300 MG CAPS Take 1 capsule by mouth daily.        [name] omeprazole (PRILOSEC) 20 MG capsule Take 20 mg by mouth daily.        . pravastatin (PRAVACHOL) 40 MG tablet Take 40 mg by mouth daily.        . Zinc-Magnesium Aspart-Vit B6 (ZINC MAGNESIUM ASPARTATE PO) Take 1 tablet by mouth daily.          OBJECTIVE: Elderly white woman who appears well Filed Vitals:   05/12/12 1021  BP: 121/71  Pulse: 77  Temp: 97.9 F (36.6 C)  Resp: 20     Body mass index is 23.98 kg/(m^2).    ECOG FS: 0  Filed Weights   05/12/12 1021  Weight: 139 lb 11.2 oz (63.368 kg)   Physical Exam: HEENT:  Sclerae anicteric.  Oropharynx clear.  Nodes:  No cervical or supraclavicular lymphadenopathy  Breast Exam:  Deferred  Lungs:  Clear to auscultation bilaterally.  Heart:  Regular rate and rhythm.  Pacemaker in place Abdomen:  Soft, nontender.  Positive bowel sounds. Musculoskeletal:  No focal spinal tenderness to palpation Extremities:  Minimal bilateral ankle edema Neuro:  Nonfocal.  Alert and oriented x3.  LAB RESULTS:   Lab Results  Component Value Date   WBC 1.9* 05/12/2012   NEUTROABS 0.8* 05/12/2012   HGB 9.6* 05/12/2012   HCT 27.7* 05/12/2012   MCV 105.9* 05/12/2012   PLT 762* 05/12/2012      Chemistry      Component Value Date/Time   NA 142 05/05/2012 1439  NA 141 02/06/2012 1047      Component Value Date/Time   CALCIUM 9.3 05/05/2012 1439   CALCIUM 9.1 02/06/2012 1047       STUDIES: Patient Name: TAIESHA, BOVARD Accession #: [id number] DOB: [birth year] Age: 26 Gender: F Client Name [facility] Collected Date: 03/19/2012 Received Date: 03/19/2012 Physician: Richarda Overlie Chart #: MRN # : [id number] Physician cc: Ruthann Cancer, MD Race:W Visit #: [id number].[place] BONE MARROW REPORT FINAL DIAGNOSIS Diagnosis Bone Marrow, Aspirate,Biopsy, and Clot, right iliac - ERYTHROID PROLIFERATION WITH DYSPOIETIC CHANGES ASSOCIATED WITH VARIABLE NUMBER OF BLASTIC CELLS. - STRIKING ATYPICAL MEGAKARYOCYTIC PROLIFERATION. - SEE Conner. PERIPHERAL BLOOD: - NORMOCYTIC-NORMOCHROMIC ANEMIA. - LEUKOPENIA. - MARKED THROMBOCYTOSIS. Diagnosis Note The number of blastic cells is borderline by morphology and/or flow cytometric analysis (5% or less). However, immunohistochemical stain for CD34 performed on clot and biopsy shows variable positivity ranging from <5% to 10-15% in a few small areas. As compared to previous marrow, the overall number of blastic cells appears relatively decreased in this case although morphologic changes in other myeloid cell types are similar to previous material. A t(3;3) was documented in previous bone marrow biopsy. This cytogenetic abnormality is seen in a distinct entity of AML which is usually associated with thrombocytosis and increased number of dysplastic megakaryocytes in the bone marrow as seen in this case. Hence, the overall changes in the current material favor residual disease process. Correlation with cytogenetic  studies is strongly recommended. (BNS:eps 03/20/12) (BNS:caf 03/21/12) Guerry Bruin MD Pathologist, Electronic Signature (Case signed 03/21/2012)   ASSESSMENT: 78 y.o.  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   (1) started azacytidine 01/14/2012  (2) anemia, s/p transfusion 2 units PRBCs (02/04/2012, 02/28/2012)  (3) neutropenia  PLAN:  Miangel will receive her sixth dose of the 80s azacitidine today, and her seventh tomorrow. That will complete cycle 5. She will see [name] again November 11 to start cycle 6, and she will see me on November 26. Before that visit she should have a repeat bone marrow biopsy and a visit with Dr. Lowell Guitar to reassess for response and possible treatment changes.   MAGRINAT,GUSTAV C    05/12/2012

## 2012-05-13 ENCOUNTER — Other Ambulatory Visit: Payer: Self-pay | Admitting: Certified Registered Nurse Anesthetist

## 2012-05-13 ENCOUNTER — Ambulatory Visit (HOSPITAL_BASED_OUTPATIENT_CLINIC_OR_DEPARTMENT_OTHER): Payer: Medicare Other

## 2012-05-13 VITALS — BP 120/61 | HR 70 | Temp 97.0°F

## 2012-05-13 DIAGNOSIS — D72819 Decreased white blood cell count, unspecified: Secondary | ICD-10-CM

## 2012-05-13 DIAGNOSIS — D649 Anemia, unspecified: Secondary | ICD-10-CM

## 2012-05-13 DIAGNOSIS — I629 Nontraumatic intracranial hemorrhage, unspecified: Secondary | ICD-10-CM

## 2012-05-13 DIAGNOSIS — D473 Essential (hemorrhagic) thrombocythemia: Secondary | ICD-10-CM

## 2012-05-13 DIAGNOSIS — C9400 Acute erythroid leukemia, not having achieved remission: Secondary | ICD-10-CM

## 2012-05-13 DIAGNOSIS — Z5111 Encounter for antineoplastic chemotherapy: Secondary | ICD-10-CM

## 2012-05-13 MED ORDER — AZACITIDINE CHEMO SQ INJECTION
75.0000 mg/m2 | Freq: Once | INTRAMUSCULAR | Status: AC
  Administered 2012-05-13: 130 mg via SUBCUTANEOUS
  Filled 2012-05-13: qty 26

## 2012-05-13 MED ORDER — ONDANSETRON HCL 8 MG PO TABS
8.0000 mg | ORAL_TABLET | Freq: Once | ORAL | Status: AC
  Administered 2012-05-13: 8 mg via ORAL

## 2012-05-13 NOTE — Patient Instructions (Addendum)
[facility] Discharge Instructions for Patients Receiving Chemotherapy  Today you received the following chemotherapy agents Vidaza  To help prevent nausea and vomiting after your treatment, we encourage you to take your nausea medication as prescribed.    If you develop nausea and vomiting that is not controlled by your nausea medication, call the clinic. If it is after clinic hours your family physician or the after hours number for the clinic or go to the Emergency Department.   BELOW ARE SYMPTOMS THAT SHOULD BE REPORTED IMMEDIATELY:  *FEVER GREATER THAN 100.5 F  *CHILLS WITH OR WITHOUT FEVER  NAUSEA AND VOMITING THAT IS NOT CONTROLLED WITH YOUR NAUSEA MEDICATION  *UNUSUAL SHORTNESS OF BREATH  *UNUSUAL BRUISING OR BLEEDING  TENDERNESS IN MOUTH AND THROAT WITH OR WITHOUT PRESENCE OF ULCERS  *URINARY PROBLEMS  *BOWEL PROBLEMS  UNUSUAL RASH Items with * indicate a potential emergency and should be followed up as soon as possible.  One of the nurses will contact you 24 hours after your treatment. Please let the nurse know about any problems that you may have experienced. Feel free to call the clinic you have any questions or concerns. The clinic phone number is [phone].   I have been informed and understand all the instructions given to me. I know to contact the clinic, my physician, or go to the Emergency Department if any problems should occur. I do not have any questions at this time, but understand that I may call the clinic during office hours   should I have any questions or need assistance in obtaining follow up care.    __________________________________________  _____________  __________ Signature of Patient or Authorized Representative            Date                   Time    __________________________________________ Nurse's Signature    

## 2012-06-02 ENCOUNTER — Other Ambulatory Visit (HOSPITAL_BASED_OUTPATIENT_CLINIC_OR_DEPARTMENT_OTHER): Payer: Medicare Other | Admitting: Lab

## 2012-06-02 ENCOUNTER — Ambulatory Visit (HOSPITAL_BASED_OUTPATIENT_CLINIC_OR_DEPARTMENT_OTHER): Payer: Medicare Other | Admitting: Physician Assistant

## 2012-06-02 ENCOUNTER — Encounter: Payer: Self-pay | Admitting: Physician Assistant

## 2012-06-02 ENCOUNTER — Ambulatory Visit (HOSPITAL_BASED_OUTPATIENT_CLINIC_OR_DEPARTMENT_OTHER): Payer: Medicare Other

## 2012-06-02 VITALS — BP 113/67 | HR 73 | Temp 97.9°F | Resp 20 | Ht 64.0 in | Wt 139.1 lb

## 2012-06-02 DIAGNOSIS — I629 Nontraumatic intracranial hemorrhage, unspecified: Secondary | ICD-10-CM

## 2012-06-02 DIAGNOSIS — D72819 Decreased white blood cell count, unspecified: Secondary | ICD-10-CM

## 2012-06-02 DIAGNOSIS — C9400 Acute erythroid leukemia, not having achieved remission: Secondary | ICD-10-CM

## 2012-06-02 DIAGNOSIS — D473 Essential (hemorrhagic) thrombocythemia: Secondary | ICD-10-CM

## 2012-06-02 DIAGNOSIS — D709 Neutropenia, unspecified: Secondary | ICD-10-CM

## 2012-06-02 DIAGNOSIS — Z5111 Encounter for antineoplastic chemotherapy: Secondary | ICD-10-CM

## 2012-06-02 DIAGNOSIS — D649 Anemia, unspecified: Secondary | ICD-10-CM

## 2012-06-02 LAB — CBC WITH DIFFERENTIAL/PLATELET
BASO%: 0.8 % (ref 0.0–2.0)
Basophils Absolute: 0 10*3/uL (ref 0.0–0.1)
EOS%: 1.3 % (ref 0.0–7.0)
HCT: 31.2 % — ABNORMAL LOW (ref 34.8–46.6)
HGB: 10.6 g/dL — ABNORMAL LOW (ref 11.6–15.9)
MCH: 35 pg — ABNORMAL HIGH (ref 25.1–34.0)
MCHC: 34 g/dL (ref 31.5–36.0)
MCV: 103 fL — ABNORMAL HIGH (ref 79.5–101.0)
MONO%: 1.7 % (ref 0.0–14.0)
NEUT%: 45.8 % (ref 38.4–76.8)

## 2012-06-02 LAB — COMPREHENSIVE METABOLIC PANEL (CC13)
ALT: 20 U/L (ref 0–55)
AST: 18 U/L (ref 5–34)
Alkaline Phosphatase: 58 U/L (ref 40–150)
BUN: 23 mg/dL (ref 7.0–26.0)
Creatinine: 0.7 mg/dL (ref 0.6–1.1)
Total Bilirubin: 0.39 mg/dL (ref 0.20–1.20)

## 2012-06-02 MED ORDER — AZACITIDINE CHEMO SQ INJECTION
75.0000 mg/m2 | Freq: Once | INTRAMUSCULAR | Status: AC
  Administered 2012-06-02: 130 mg via SUBCUTANEOUS
  Filled 2012-06-02: qty 26

## 2012-06-02 MED ORDER — ONDANSETRON HCL 8 MG PO TABS
8.0000 mg | ORAL_TABLET | Freq: Once | ORAL | Status: AC
  Administered 2012-06-02: 8 mg via ORAL

## 2012-06-02 NOTE — Patient Instructions (Signed)
[facility] Discharge Instructions for Patients Receiving Chemotherapy  Today you received the following chemotherapy agents Vidaza    If you develop nausea and vomiting that is not controlled by your nausea medication, call the clinic. If it is after clinic hours your family physician or the after hours number for the clinic or go to the Emergency Department.   BELOW ARE SYMPTOMS THAT SHOULD BE REPORTED IMMEDIATELY:  *FEVER GREATER THAN 100.5 F  *CHILLS WITH OR WITHOUT FEVER  NAUSEA AND VOMITING THAT IS NOT CONTROLLED WITH YOUR NAUSEA MEDICATION  *UNUSUAL SHORTNESS OF BREATH  *UNUSUAL BRUISING OR BLEEDING  TENDERNESS IN MOUTH AND THROAT WITH OR WITHOUT PRESENCE OF ULCERS  *URINARY PROBLEMS  *BOWEL PROBLEMS  UNUSUAL RASH Items with * indicate a potential emergency and should be followed up as soon as possible.  One of the nurses will contact you 24 hours after your treatment. Please let the nurse know about any problems that you may have experienced. Feel free to call the clinic you have any questions or concerns. The clinic phone number is [phone].   I have been informed and understand all the instructions given to me. I know to contact the clinic, my physician, or go to the Emergency Department if any problems should occur. I do not have any questions at this time, but understand that I may call the clinic during office hours   should I have any questions or need assistance in obtaining follow up care.    __________________________________________  _____________  __________ Signature of Patient or Authorized Representative            Date                   Time    __________________________________________ Nurse's Signature    

## 2012-06-02 NOTE — Progress Notes (Signed)
ID: [name]   DOB: 27-Jan-1929  MR#: [id number]  GEX#:[id number]  HISTORY OF PRESENT ILLNESS: The patient sees Dr. [name] for routine health maintenance. On March 2013 he noted that the patient's hemoglobin was 8.7 (previously it had been 11.1). The MCV was at 107.1. The platelet count was 660,000. The white cell count was 4.2. He obtained a B12 and folate which were within normal limits at 1744 and 8.4 respectively.[name] He checked a ferritin to evaluate the thrombocytosis, and this was 151. Serum protein electrophoresis did not show an M spike and the urine protein electrophoresis was likewise negative. He referred the patient for further evaluation. 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 Today is day 1 cycle 6 of her azacytidine treatment.   The interval history is unremarkable.  She maintains an excellent functional status and when asked what her biggest complaint was she responds, "I do not have any."  REVIEW OF SYSTEMS: [name] energy level is good. She has had no fevers, chills, night sweats, or unexplained weight loss. She is eating and drinking well with no nausea or change in bowel habits. She's had no cough or increased shortness of breath. No chest pain or palpitations. She denies any abnormal headaches or dizziness and has had no unusual myalgias, arthralgias, peripheral swelling, or peripheral neuropathy.  A detailed review of systems is otherwise noncontributory.   PAST MEDICAL HISTORY: Past Medical History  Diagnosis Date  . Pacemaker     left shoulder  . Cataract     had surgery  . GERD (gastroesophageal reflux disease)   . Glaucoma   . Hyperlipidemia   . Osteoporosis   . Thyroid disease   . PUD (peptic ulcer disease)   . Lichen sclerosus et atrophicus of the vulva   . Intracranial hemorrhage     PAST SURGICAL HISTORY: Past Surgical History  Procedure Date  .  Cataract extraction w/ intraocular lens  implant, bilateral   . Dilation and curettage of uterus   . Tonsillectomy and adenoidectomy   . Pacemaker insertion     left shoulder  . Subdural hematoma evacuation via craniotomy January 01, 2004    after a fall  . Colonoscopy     FAMILY HISTORY Family History  Problem Relation Age of Onset  . Colon Conner Mother    the patient's father died at the age of 68 from a myocardial infarction. Her mother was diagnosed with colon Conner at the age of 52 and died from that disease within a year. The patient had 2 sisters. One died at the age of 36 with a neck fracture. The patient has 2 brothers. There is no other history of Conner or hematologic problems in the family to her knowledge  GYNECOLOGIC HISTORY: She does not recall when she had menarche. She is GX P1, first live birth age 22. She went through the change of life in her 24s. She never took hormone replacement.  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 Her husband died in 01-01-96 from a heart attack. The patient lives alone, with no pets. Her daughter, [name] lives in [place]. She has a PhD in Water quality scientist and works for [employer]. Her phone number is [phone].The patient's brother [name] lives next door and in case of an emergency she will like [name] to call him first. His phone number is [phone]  ADVANCED DIRECTIVES: in place  HEALTH MAINTENANCE: History  Substance Use Topics  . Smoking status: Former Games developer  . Smokeless tobacco: Never Used  . Alcohol Use: No     Colonoscopy: [facility]/ May 2012  PAP: "no longer"  Bone density: osteoporosis   Lipid panel: [name]             Mammogram: Nov 2012  Allergies  Allergen Reactions  . Sulfa Antibiotics     Pt. States she may have had a reaction a long time ago    Current Outpatient Prescriptions  Medication Sig Dispense Refill  . alendronate (FOSAMAX) 70 MG  tablet Take 70 mg by mouth every 7 (seven) days. Take with a full glass of water on an empty stomach. Takes on Saturdays      . aspirin 325 MG EC tablet Take 325 mg by mouth daily.       . bimatoprost (LUMIGAN) 0.03 % ophthalmic solution Place 1 drop into both eyes at bedtime.        . brinzolamide (AZOPT) 1 % ophthalmic suspension Place 1 drop into both eyes 2 (two) times daily.        . Calcium Carbonate-Vitamin D (CALTRATE 600+D PO) Take 1 tablet by mouth 2 (two) times daily with breakfast and lunch.        . Cholecalciferol (VITAMIN D) 2000 UNITS tablet Take 2,000 Units by mouth 2 (two) times daily.        . Cyanocobalamin (VITAMIN B 12 PO) Take 2,000 mcg by mouth daily.      . Folic Acid 20 MG CAPS Take 40 mg by mouth 2 (two) times daily.      [name] levothyroxine (SYNTHROID, LEVOTHROID) 88 MCG tablet Take 88 mcg by mouth every morning.       . Multiple Vitamins-Minerals (MACULAR VITAMIN BENEFIT PO) Take 2 tablets by mouth daily.      [name] nystatin cream (MYCOSTATIN) Apply 1 application topically as needed.       . OMEGA-3 KRILL OIL 300 MG CAPS Take 1 capsule by mouth daily.        [name] omeprazole (PRILOSEC) 20 MG capsule Take 20 mg by mouth daily.        . pravastatin (PRAVACHOL) 40 MG tablet Take 40 mg by mouth daily.        . Zinc-Magnesium Aspart-Vit B6 (ZINC MAGNESIUM ASPARTATE PO) Take 1 tablet by mouth daily.         No current facility-administered medications for this visit.   Facility-Administered Medications Ordered in Other Visits  Medication Dose Route Frequency Provider Last Rate Last Dose  . azaCITIDine (VIDAZA) chemo injection 130 mg  75 mg/m2 (Treatment Plan Actual) Subcutaneous Once Chloe Puller Allegra Grana, [name]      . [COMPLETED] ondansetron (ZOFRAN) tablet 8 mg  8 mg Oral Once Chloe Gravel, [name]   8 mg at 06/02/12 1543    OBJECTIVE: Elderly white woman who appears well Filed Vitals:   06/02/12 1510  BP: 113/67  Pulse: 73  Temp: 97.9 F (36.6 C)  Resp: 20     Body mass index is 23.88  kg/(m^2).    ECOG FS: 0  Filed Weights   06/02/12 1510  Weight: 139 lb 1.6 oz (63.095 kg)   Physical Exam: HEENT:  Sclerae anicteric.  Oropharynx clear.  Nodes:  No cervical or supraclavicular lymphadenopathy  Breast Exam:  Deferred, no axillary adenopathy Lungs:  Clear to auscultation bilaterally.  Heart:  Regular rate and rhythm.  Pacemaker in place Abdomen:  Soft, nontender.  Positive bowel sounds. Musculoskeletal:  No focal spinal tenderness to palpation Extremities: No peripheral edema Neuro:  Nonfocal. Alert and oriented x3.  LAB RESULTS:   Lab Results  Component Value Date   WBC 2.4* 06/02/2012   NEUTROABS 1.1* 06/02/2012   HGB 10.6* 06/02/2012   HCT 31.2* 06/02/2012   MCV 103.0* 06/02/2012   PLT 526* 06/02/2012      Chemistry      Component Value Date/Time   NA 140 06/02/2012 1449   NA 141 02/06/2012 1047      Component Value Date/Time   CALCIUM 9.4 06/02/2012 1449   CALCIUM 9.1 02/06/2012 1047       STUDIES: Patient Name: [name], [name] Accession #: [id number] DOB: 11/19/1928 Age: 24 Gender: F Client Name [name] Collected Date: 03/19/2012 Received Date: 03/19/2012 Physician: [name] Chart #: MRN # : [id number] Physician cc: Chloe Cancer, [name] Race:W Visit #: [id number].[place] BONE MARROW REPORT FINAL DIAGNOSIS Diagnosis Bone Marrow, Aspirate,Biopsy, and Clot, right iliac - ERYTHROID PROLIFERATION WITH DYSPOIETIC CHANGES ASSOCIATED WITH VARIABLE NUMBER OF BLASTIC CELLS. - STRIKING ATYPICAL MEGAKARYOCYTIC PROLIFERATION. - SEE Conner. PERIPHERAL BLOOD: - NORMOCYTIC-NORMOCHROMIC ANEMIA. - LEUKOPENIA. - MARKED THROMBOCYTOSIS. Diagnosis Note The number of blastic cells is borderline by morphology and/or flow cytometric analysis (5% or less). However, immunohistochemical stain for CD34 performed on clot and biopsy shows variable positivity ranging from <5% to 10-15% in a few small areas. As compared to previous marrow, the overall number  of blastic cells appears relatively decreased in this case although morphologic changes in other myeloid cell types are similar to previous material. A t(3;3) was documented in previous bone marrow biopsy. This cytogenetic abnormality is seen in a distinct entity of AML which is usually associated with thrombocytosis and increased number of dysplastic megakaryocytes in the bone marrow as seen in this case. Hence, the overall changes in the current material favor residual disease process. Correlation with cytogenetic studies is strongly recommended. (BNS:eps 03/20/12) (BNS:caf 03/21/12) Chloe Bruin [name] Pathologist, Electronic Signature (Case signed 03/21/2012)   ASSESSMENT: 78 y.o.  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   (1) started azacytidine 01/14/2012  (2) anemia, s/p transfusion 2 units PRBCs (02/04/2012, 02/28/2012)  (3) neutropenia  PLAN:  Particia will  Begin her sixth cycle of  Azacytidine today, and will receive injections daily November 11 through November 15, then on November 18 and 19th to complete 7 injections. She will see Dr. Darnelle Catalan on November 26, and prior to that appointment we will try to schedule a bone marrow biopsy and a followup visit with Dr. Lowell Guitar to reassess for response and possible treatment changes.  All of this was reviewed with Kanyla today and she voices understanding and agreement with this plan. Of course she knows to call with any changes or problems prior to her next scheduled appointment.    Adrean Findlay    06/02/2012

## 2012-06-03 ENCOUNTER — Other Ambulatory Visit: Payer: Self-pay | Admitting: Physician Assistant

## 2012-06-03 ENCOUNTER — Ambulatory Visit (HOSPITAL_BASED_OUTPATIENT_CLINIC_OR_DEPARTMENT_OTHER): Payer: Medicare Other

## 2012-06-03 ENCOUNTER — Telehealth: Payer: Self-pay | Admitting: Oncology

## 2012-06-03 VITALS — BP 111/73 | HR 59 | Temp 97.4°F | Resp 16

## 2012-06-03 DIAGNOSIS — D473 Essential (hemorrhagic) thrombocythemia: Secondary | ICD-10-CM

## 2012-06-03 DIAGNOSIS — C9400 Acute erythroid leukemia, not having achieved remission: Secondary | ICD-10-CM

## 2012-06-03 DIAGNOSIS — D649 Anemia, unspecified: Secondary | ICD-10-CM

## 2012-06-03 DIAGNOSIS — D72819 Decreased white blood cell count, unspecified: Secondary | ICD-10-CM

## 2012-06-03 DIAGNOSIS — I629 Nontraumatic intracranial hemorrhage, unspecified: Secondary | ICD-10-CM

## 2012-06-03 DIAGNOSIS — Z5111 Encounter for antineoplastic chemotherapy: Secondary | ICD-10-CM

## 2012-06-03 MED ORDER — ONDANSETRON HCL 8 MG PO TABS
8.0000 mg | ORAL_TABLET | Freq: Once | ORAL | Status: AC
  Administered 2012-06-03: 8 mg via ORAL

## 2012-06-03 MED ORDER — AZACITIDINE CHEMO SQ INJECTION
75.0000 mg/m2 | Freq: Once | INTRAMUSCULAR | Status: AC
  Administered 2012-06-03: 130 mg via SUBCUTANEOUS
  Filled 2012-06-03: qty 26

## 2012-06-03 NOTE — Patient Instructions (Signed)
[facility] Discharge Instructions for Patients Receiving Chemotherapy  Today you received the following chemotherapy agents Vidaza  To help prevent nausea and vomiting after your treatment, we encourage you to take your nausea medication. If you develop nausea and vomiting that is not controlled by your nausea medication, call the clinic. If it is after clinic hours your family physician or the after hours number for the clinic or go to the Emergency Department.   BELOW ARE SYMPTOMS THAT SHOULD BE REPORTED IMMEDIATELY:  *FEVER GREATER THAN 100.5 F  *CHILLS WITH OR WITHOUT FEVER  NAUSEA AND VOMITING THAT IS NOT CONTROLLED WITH YOUR NAUSEA MEDICATION  *UNUSUAL SHORTNESS OF BREATH  *UNUSUAL BRUISING OR BLEEDING  TENDERNESS IN MOUTH AND THROAT WITH OR WITHOUT PRESENCE OF ULCERS  *URINARY PROBLEMS  *BOWEL PROBLEMS  UNUSUAL RASH Items with * indicate a potential emergency and should be followed up as soon as possible.  One of the nurses will contact you 24 hours after your treatment. Please let the nurse know about any problems that you may have experienced. Feel free to call the clinic you have any questions or concerns. The clinic phone number is [phone].   I have been informed and understand all the instructions given to me. I know to contact the clinic, my physician, or go to the Emergency Department if any problems should occur. I do not have any questions at this time, but understand that I may call the clinic during office hours   should I have any questions or need assistance in obtaining follow up care.    __________________________________________  _____________  __________ Signature of Patient or Authorized Representative            Date                   Time    __________________________________________ Nurse's Signature    

## 2012-06-03 NOTE — Telephone Encounter (Signed)
S/w kim in medical records regarding the pt needing an appt with dr Lowell Guitar at [facility] in nov. S/w tina at [facility] regarding the pt needing a bone marrow biopsy.

## 2012-06-03 NOTE — Telephone Encounter (Signed)
S/w jennifer from rad scheduling regarding the pt needing a ct bone marrow biopsy.

## 2012-06-04 ENCOUNTER — Telehealth: Payer: Self-pay | Admitting: Oncology

## 2012-06-04 ENCOUNTER — Ambulatory Visit (HOSPITAL_BASED_OUTPATIENT_CLINIC_OR_DEPARTMENT_OTHER): Payer: Medicare Other

## 2012-06-04 VITALS — BP 135/86 | HR 70 | Temp 97.8°F

## 2012-06-04 DIAGNOSIS — I629 Nontraumatic intracranial hemorrhage, unspecified: Secondary | ICD-10-CM

## 2012-06-04 DIAGNOSIS — D649 Anemia, unspecified: Secondary | ICD-10-CM

## 2012-06-04 DIAGNOSIS — C9400 Acute erythroid leukemia, not having achieved remission: Secondary | ICD-10-CM

## 2012-06-04 DIAGNOSIS — D473 Essential (hemorrhagic) thrombocythemia: Secondary | ICD-10-CM

## 2012-06-04 DIAGNOSIS — Z5111 Encounter for antineoplastic chemotherapy: Secondary | ICD-10-CM

## 2012-06-04 DIAGNOSIS — D72819 Decreased white blood cell count, unspecified: Secondary | ICD-10-CM

## 2012-06-04 MED ORDER — ONDANSETRON HCL 8 MG PO TABS
8.0000 mg | ORAL_TABLET | Freq: Once | ORAL | Status: AC
  Administered 2012-06-04: 8 mg via ORAL

## 2012-06-04 MED ORDER — AZACITIDINE CHEMO SQ INJECTION
75.0000 mg/m2 | Freq: Once | INTRAMUSCULAR | Status: AC
  Administered 2012-06-04: 130 mg via SUBCUTANEOUS
  Filled 2012-06-04: qty 26

## 2012-06-04 NOTE — Telephone Encounter (Signed)
Rescheduled pt appt at [facility]  from 06/10/12 to 06/24/12@10 :15. Pt is aware.

## 2012-06-04 NOTE — Telephone Encounter (Signed)
Pt appt with Dr. Lowell Guitar at [facility] is 06/10/12@1 :15. Faxed medical records. Called pt lvm to return call.

## 2012-06-05 ENCOUNTER — Ambulatory Visit (HOSPITAL_BASED_OUTPATIENT_CLINIC_OR_DEPARTMENT_OTHER): Payer: Medicare Other

## 2012-06-05 VITALS — BP 117/46 | HR 68 | Temp 98.2°F

## 2012-06-05 DIAGNOSIS — D72819 Decreased white blood cell count, unspecified: Secondary | ICD-10-CM

## 2012-06-05 DIAGNOSIS — D649 Anemia, unspecified: Secondary | ICD-10-CM

## 2012-06-05 DIAGNOSIS — C9400 Acute erythroid leukemia, not having achieved remission: Secondary | ICD-10-CM

## 2012-06-05 DIAGNOSIS — Z5111 Encounter for antineoplastic chemotherapy: Secondary | ICD-10-CM

## 2012-06-05 DIAGNOSIS — I629 Nontraumatic intracranial hemorrhage, unspecified: Secondary | ICD-10-CM

## 2012-06-05 DIAGNOSIS — D473 Essential (hemorrhagic) thrombocythemia: Secondary | ICD-10-CM

## 2012-06-05 MED ORDER — AZACITIDINE CHEMO SQ INJECTION
75.0000 mg/m2 | Freq: Once | INTRAMUSCULAR | Status: AC
  Administered 2012-06-05: 130 mg via SUBCUTANEOUS
  Filled 2012-06-05: qty 26

## 2012-06-05 MED ORDER — ONDANSETRON HCL 8 MG PO TABS
8.0000 mg | ORAL_TABLET | Freq: Once | ORAL | Status: AC
  Administered 2012-06-05: 8 mg via ORAL

## 2012-06-06 ENCOUNTER — Ambulatory Visit (HOSPITAL_BASED_OUTPATIENT_CLINIC_OR_DEPARTMENT_OTHER): Payer: Medicare Other

## 2012-06-06 VITALS — BP 124/50 | HR 68 | Temp 98.3°F

## 2012-06-06 DIAGNOSIS — I629 Nontraumatic intracranial hemorrhage, unspecified: Secondary | ICD-10-CM

## 2012-06-06 DIAGNOSIS — C9400 Acute erythroid leukemia, not having achieved remission: Secondary | ICD-10-CM

## 2012-06-06 DIAGNOSIS — D72819 Decreased white blood cell count, unspecified: Secondary | ICD-10-CM

## 2012-06-06 DIAGNOSIS — Z5111 Encounter for antineoplastic chemotherapy: Secondary | ICD-10-CM

## 2012-06-06 DIAGNOSIS — D473 Essential (hemorrhagic) thrombocythemia: Secondary | ICD-10-CM

## 2012-06-06 DIAGNOSIS — D649 Anemia, unspecified: Secondary | ICD-10-CM

## 2012-06-06 MED ORDER — AZACITIDINE CHEMO SQ INJECTION
75.0000 mg/m2 | Freq: Once | INTRAMUSCULAR | Status: AC
  Administered 2012-06-06: 130 mg via SUBCUTANEOUS
  Filled 2012-06-06: qty 26

## 2012-06-06 MED ORDER — ONDANSETRON HCL 8 MG PO TABS
8.0000 mg | ORAL_TABLET | Freq: Once | ORAL | Status: AC
  Administered 2012-06-06: 8 mg via ORAL

## 2012-06-09 ENCOUNTER — Ambulatory Visit (HOSPITAL_BASED_OUTPATIENT_CLINIC_OR_DEPARTMENT_OTHER): Payer: Medicare Other

## 2012-06-09 DIAGNOSIS — Z5111 Encounter for antineoplastic chemotherapy: Secondary | ICD-10-CM

## 2012-06-09 DIAGNOSIS — D473 Essential (hemorrhagic) thrombocythemia: Secondary | ICD-10-CM

## 2012-06-09 DIAGNOSIS — C9400 Acute erythroid leukemia, not having achieved remission: Secondary | ICD-10-CM

## 2012-06-09 DIAGNOSIS — I629 Nontraumatic intracranial hemorrhage, unspecified: Secondary | ICD-10-CM

## 2012-06-09 DIAGNOSIS — D72819 Decreased white blood cell count, unspecified: Secondary | ICD-10-CM

## 2012-06-09 DIAGNOSIS — D75839 Thrombocytosis, unspecified: Secondary | ICD-10-CM

## 2012-06-09 DIAGNOSIS — D649 Anemia, unspecified: Secondary | ICD-10-CM

## 2012-06-09 MED ORDER — AZACITIDINE CHEMO SQ INJECTION
75.0000 mg/m2 | Freq: Once | INTRAMUSCULAR | Status: AC
  Administered 2012-06-09: 130 mg via SUBCUTANEOUS
  Filled 2012-06-09: qty 26

## 2012-06-09 MED ORDER — ONDANSETRON HCL 8 MG PO TABS
8.0000 mg | ORAL_TABLET | Freq: Once | ORAL | Status: AC
  Administered 2012-06-09: 8 mg via ORAL

## 2012-06-10 ENCOUNTER — Encounter (HOSPITAL_COMMUNITY): Payer: Self-pay

## 2012-06-10 ENCOUNTER — Other Ambulatory Visit: Payer: Self-pay | Admitting: Radiology

## 2012-06-10 ENCOUNTER — Ambulatory Visit (HOSPITAL_BASED_OUTPATIENT_CLINIC_OR_DEPARTMENT_OTHER): Payer: Medicare Other

## 2012-06-10 VITALS — BP 131/52 | HR 81 | Temp 95.4°F

## 2012-06-10 DIAGNOSIS — C9400 Acute erythroid leukemia, not having achieved remission: Secondary | ICD-10-CM

## 2012-06-10 DIAGNOSIS — D649 Anemia, unspecified: Secondary | ICD-10-CM

## 2012-06-10 DIAGNOSIS — Z5111 Encounter for antineoplastic chemotherapy: Secondary | ICD-10-CM

## 2012-06-10 DIAGNOSIS — D72819 Decreased white blood cell count, unspecified: Secondary | ICD-10-CM

## 2012-06-10 DIAGNOSIS — I629 Nontraumatic intracranial hemorrhage, unspecified: Secondary | ICD-10-CM

## 2012-06-10 DIAGNOSIS — D473 Essential (hemorrhagic) thrombocythemia: Secondary | ICD-10-CM

## 2012-06-10 MED ORDER — ONDANSETRON HCL 8 MG PO TABS
8.0000 mg | ORAL_TABLET | Freq: Once | ORAL | Status: AC
  Administered 2012-06-10: 8 mg via ORAL

## 2012-06-10 MED ORDER — AZACITIDINE CHEMO SQ INJECTION
75.0000 mg/m2 | Freq: Once | INTRAMUSCULAR | Status: AC
  Administered 2012-06-10: 130 mg via SUBCUTANEOUS
  Filled 2012-06-10: qty 26

## 2012-06-11 ENCOUNTER — Other Ambulatory Visit: Payer: Self-pay | Admitting: Certified Registered Nurse Anesthetist

## 2012-06-12 ENCOUNTER — Ambulatory Visit (HOSPITAL_COMMUNITY)
Admission: RE | Admit: 2012-06-12 | Discharge: 2012-06-12 | Disposition: A | Discharge status: Home / Self Care | Payer: Medicare Other | Source: Ambulatory Visit | Attending: Oncology | Admitting: Oncology

## 2012-06-12 ENCOUNTER — Ambulatory Visit (HOSPITAL_COMMUNITY)
Admission: RE | Admit: 2012-06-12 | Discharge: 2012-06-12 | Disposition: A | Discharge status: Home / Self Care | Payer: Medicare Other | Source: Ambulatory Visit | Attending: Physician Assistant | Admitting: Physician Assistant

## 2012-06-12 ENCOUNTER — Encounter (HOSPITAL_COMMUNITY): Payer: Self-pay

## 2012-06-12 VITALS — BP 115/55 | HR 64 | Temp 97.7°F | Resp 18

## 2012-06-12 DIAGNOSIS — D72819 Decreased white blood cell count, unspecified: Secondary | ICD-10-CM

## 2012-06-12 DIAGNOSIS — C9400 Acute erythroid leukemia, not having achieved remission: Secondary | ICD-10-CM

## 2012-06-12 DIAGNOSIS — D473 Essential (hemorrhagic) thrombocythemia: Secondary | ICD-10-CM

## 2012-06-12 DIAGNOSIS — D649 Anemia, unspecified: Secondary | ICD-10-CM

## 2012-06-12 DIAGNOSIS — E785 Hyperlipidemia, unspecified: Secondary | ICD-10-CM | POA: Insufficient documentation

## 2012-06-12 DIAGNOSIS — C9590 Leukemia, unspecified not having achieved remission: Secondary | ICD-10-CM | POA: Insufficient documentation

## 2012-06-12 DIAGNOSIS — Z79899 Other long term (current) drug therapy: Secondary | ICD-10-CM | POA: Insufficient documentation

## 2012-06-12 DIAGNOSIS — Z882 Allergy status to sulfonamides status: Secondary | ICD-10-CM | POA: Insufficient documentation

## 2012-06-12 DIAGNOSIS — Z950 Presence of cardiac pacemaker: Secondary | ICD-10-CM | POA: Insufficient documentation

## 2012-06-12 DIAGNOSIS — K219 Gastro-esophageal reflux disease without esophagitis: Secondary | ICD-10-CM | POA: Insufficient documentation

## 2012-06-12 LAB — CBC
Hemoglobin: 10.1 g/dL — ABNORMAL LOW (ref 12.0–15.0)
MCH: 35.7 pg — ABNORMAL HIGH (ref 26.0–34.0)
MCHC: 35.4 g/dL (ref 30.0–36.0)
Platelets: 595 10*3/uL — ABNORMAL HIGH (ref 150–400)
RDW: 13.4 % (ref 11.5–15.5)

## 2012-06-12 LAB — PROTIME-INR
INR: 1.09 (ref 0.00–1.49)
Prothrombin Time: 14 seconds (ref 11.6–15.2)

## 2012-06-12 LAB — APTT: aPTT: 29 seconds (ref 24–37)

## 2012-06-12 MED ORDER — MIDAZOLAM HCL 2 MG/2ML IJ SOLN
INTRAMUSCULAR | Status: AC
  Filled 2012-06-12: qty 6

## 2012-06-12 MED ORDER — FENTANYL CITRATE 0.05 MG/ML IJ SOLN
INTRAMUSCULAR | Status: AC
  Filled 2012-06-12: qty 6

## 2012-06-12 MED ORDER — FENTANYL CITRATE 0.05 MG/ML IJ SOLN
INTRAMUSCULAR | Status: AC | PRN
  Administered 2012-06-12: 50 ug via INTRAVENOUS

## 2012-06-12 MED ORDER — SODIUM CHLORIDE 0.9 % IV SOLN
INTRAVENOUS | Status: DC
  Administered 2012-06-12: 08:00:00 via INTRAVENOUS

## 2012-06-12 MED ORDER — MIDAZOLAM HCL 2 MG/2ML IJ SOLN
INTRAMUSCULAR | Status: AC | PRN
  Administered 2012-06-12: 1 mg via INTRAVENOUS

## 2012-06-12 MED ORDER — HYDROCODONE-ACETAMINOPHEN 5-325 MG PO TABS
1.0000 | ORAL_TABLET | ORAL | Status: DC | PRN
  Filled 2012-06-12: qty 2

## 2012-06-12 NOTE — H&P (Signed)
Chief Complaint: "I'm here for another bone marrow biopsy" Referring Physician:Amy Allyson Sabal HPI: [name] is an 78 y.o. female with persistent leukopenia and thrombocytosis. She has had bone marrow biopsies twice this year already. Left side in May, right side in August, she did well both times. She is referred for another BM biopsy today. PMHx and meds reviewed  Past Medical History:  Past Medical History  Diagnosis Date  . Pacemaker     left shoulder  . Cataract     had surgery  . GERD (gastroesophageal reflux disease)   . Glaucoma   . Hyperlipidemia   . Osteoporosis   . Thyroid disease   . PUD (peptic ulcer disease)   . Lichen sclerosus et atrophicus of the vulva   . Intracranial hemorrhage     Past Surgical History:  Past Surgical History  Procedure Date  . Cataract extraction w/ intraocular lens  implant, bilateral   . Dilation and curettage of uterus   . Tonsillectomy and adenoidectomy   . Pacemaker insertion     left shoulder  . Subdural hematoma evacuation via craniotomy 2005    after a fall  . Colonoscopy     Family History:  Family History  Problem Relation Age of Onset  . Colon cancer Mother     Social History:  reports that she has never smoked. She has never used smokeless tobacco. She reports that she does not drink alcohol or use illicit drugs.  Allergies:  Allergies  Allergen Reactions  . Sulfa Antibiotics     Pt. States she may have had a reaction a long time ago    Medications: alendronate (FOSAMAX) 70 MG tablet Sig - Route: Take 70 mg by mouth every 7 (seven) days. Take with a full glass of water on an empty stomach. Takes on Saturdays - Oral Class: Historical Med Number of times this order has been changed since signing: 1 Order [address] aspirin 325 MG EC tablet Sig - Route: Take 325 mg by mouth every morning. - Oral Class: Historical Med Number of times this order has been changed since signing: 3 Order [address] bimatoprost (LUMIGAN)  0.03 % ophthalmic solution Sig - Route: Place 1 drop into both eyes at bedtime. - Both Eyes Class: Historical Med Number of times this order has been changed since signing: 1 Order [address] brinzolamide (AZOPT) 1 % ophthalmic suspension Sig - Route: Place 1 drop into both eyes 2 (two) times daily. - Both Eyes Class: Historical Med Number of times this order has been changed since signing: 1 Order [address] Calcium Carbonate-Vitamin D (CALTRATE 600+D PO) Sig - Route: Take 1 tablet by mouth 2 (two) times daily with breakfast and lunch. - Oral Class: Historical Med Number of times this order has been changed since signing: 1 Order [address] Cholecalciferol (VITAMIN D) 2000 UNITS tablet Sig - Route: Take 2,000 Units by mouth 2 (two) times daily. - Oral Class: Historical Med Number of times this order has been changed since signing: 1 Order [address] Cyanocobalamin (VITAMIN B 12 PO) Sig - Route: Take 2,000 mcg by mouth daily. - Oral Class: Historical Med Number of times this order has been changed since signing: 1 Order [address] Folic Acid 20 MG CAPS Sig - Route: Take 40 mg by mouth 2 (two) times daily. - Oral Class: Historical Med levothyroxine (SYNTHROID, LEVOTHROID) 88 MCG tablet Sig - Route: Take 88 mcg by mouth every morning. - Oral Class: Historical Med Number of  times this order has been changed since signing: 2 Order [address] Multiple Vitamins-Minerals (MACULAR VITAMIN BENEFIT PO) Sig - Route: Take 2 tablets by mouth daily. - Oral Class: Historical Med Number of times this order has been changed since signing: 1 Order [address] nystatin cream (MYCOSTATIN) Sig - Route: Apply 1 application topically as needed. - Topical Class: Historical Med Number of times this order has been changed since signing: 3 Order [address] OMEGA-3 KRILL OIL 300 MG CAPS Sig - Route: Take 1 capsule by mouth daily. - Oral Class: Historical Med Number of times this order has been changed since signing: 1 Order [address]  omeprazole (PRILOSEC) 20 MG capsule Sig - Route: Take 20 mg by mouth daily. - Oral Class: Historical Med Number of times this order has been changed since signing: 1 Order [address] pravastatin (PRAVACHOL) 40 MG tablet Sig - Route: Take 40 mg by mouth daily. - Oral Class: Historical Med Number of times this order has been changed since signing: 1 Order [address] Zinc-Magnesium Aspart-Vit B6 (ZINC MAGNESIUM ASPARTATE PO) Sig - Route: Take 1 tablet by mouth daily. - Oral Class: Historical Med   Please HPI for pertinent positives, otherwise complete 10 system ROS negative.  Physical Exam: Blood pressure 122/64, pulse 72, temperature 97.9 F (36.6 C), temperature source Oral, resp. rate 18, SpO2 98.00%. There is no height or weight on file to calculate BMI.   General Appearance:  Alert, cooperative, no distress, appears stated age  Head:  Normocephalic, without obvious abnormality, atraumatic  ENT: Unremarkable  Neck: Supple, symmetrical, trachea midline, no adenopathy, thyroid: not enlarged, symmetric, no tenderness/mass/nodules  Lungs:   Clear to auscultation bilaterally, no w/r/r, respirations unlabored without use of accessory muscles.  Chest Wall:  No tenderness. (L)upper outer chest wall pacemaker palpable, NT  Heart:  Regular rate and rhythm, S1, S2 normal, no murmur, rub or gallop. Carotids 2+ without bruit.  Neurologic: Normal affect, no gross deficits.   No results found for this or any previous visit (from the past 48 hour(s)). No results found.  Assessment/Plan Leukopenia, thrombocytosis Repeat BM biopsy today Procedure reviewed with pt including risks and complications. Labs pending Consent signed in chart  Brayton El PA-C 06/12/2012, 8:22 AM

## 2012-06-12 NOTE — Procedures (Signed)
CT bone marrow biopsy Core and aspiration R iliac wing No complication No blood loss. See complete dictation in [facility].

## 2012-06-12 NOTE — Progress Notes (Signed)
Patient ambulated in hallway and to restroom. Patient denies SOB, pain and dizziness.  

## 2012-06-17 ENCOUNTER — Telehealth: Payer: Self-pay | Admitting: Oncology

## 2012-06-17 ENCOUNTER — Other Ambulatory Visit (HOSPITAL_BASED_OUTPATIENT_CLINIC_OR_DEPARTMENT_OTHER): Payer: Medicare Other | Admitting: Lab

## 2012-06-17 ENCOUNTER — Ambulatory Visit (HOSPITAL_BASED_OUTPATIENT_CLINIC_OR_DEPARTMENT_OTHER): Payer: Medicare Other | Admitting: Oncology

## 2012-06-17 VITALS — BP 126/70 | HR 77 | Temp 97.9°F | Resp 20 | Ht 64.0 in | Wt 140.2 lb

## 2012-06-17 DIAGNOSIS — D709 Neutropenia, unspecified: Secondary | ICD-10-CM

## 2012-06-17 DIAGNOSIS — C9400 Acute erythroid leukemia, not having achieved remission: Secondary | ICD-10-CM

## 2012-06-17 DIAGNOSIS — D649 Anemia, unspecified: Secondary | ICD-10-CM

## 2012-06-17 DIAGNOSIS — D473 Essential (hemorrhagic) thrombocythemia: Secondary | ICD-10-CM

## 2012-06-17 LAB — CBC WITH DIFFERENTIAL/PLATELET
BASO%: 0.5 % (ref 0.0–2.0)
HCT: 31.1 % — ABNORMAL LOW (ref 34.8–46.6)
LYMPH%: 35.8 % (ref 14.0–49.7)
MCHC: 35 g/dL (ref 31.5–36.0)
MCV: 105.4 fL — ABNORMAL HIGH (ref 79.5–101.0)
MONO#: 0.2 10*3/uL (ref 0.1–0.9)
MONO%: 7.1 % (ref 0.0–14.0)
NEUT%: 52 % (ref 38.4–76.8)
Platelets: 410 10*3/uL — ABNORMAL HIGH (ref 145–400)
WBC: 2.8 10*3/uL — ABNORMAL LOW (ref 3.9–10.3)

## 2012-06-17 LAB — COMPREHENSIVE METABOLIC PANEL (CC13)
ALT: 21 U/L (ref 0–55)
Alkaline Phosphatase: 61 U/L (ref 40–150)
CO2: 27 mEq/L (ref 22–29)
Creatinine: 0.7 mg/dL (ref 0.6–1.1)
Glucose: 69 mg/dl — ABNORMAL LOW (ref 70–99)
Total Bilirubin: 0.34 mg/dL (ref 0.20–1.20)

## 2012-06-17 NOTE — Telephone Encounter (Signed)
gve the pt her dec-feb 2014 appt calendar

## 2012-06-17 NOTE — Progress Notes (Signed)
ID: [name]   DOB: June 16, 1929  MR#: [id number]  WUJ#:[id number]  HISTORY OF PRESENT ILLNESS: The patient sees Dr. [name] for routine health maintenance. On March 2013 he noted that the patient's hemoglobin was 8.7 (previously it had been 11.1). The MCV was at 107.1. The platelet count was 660,000. The white cell count was 4.2. He obtained a B12 and folate which were within normal limits at 1744 and 8.4 respectively.[name] He checked a ferritin to evaluate the thrombocytosis, and this was 151. Serum protein electrophoresis did not show an M spike and the urine protein electrophoresis was likewise negative. He referred the patient for further evaluation. 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 She has completed 6 cycles of azacytidine, the most recent one starting 06/02/2012. She had a repeat bone marrow biopsy 06/12/2012, and the preliminary report is that it is showing less than 5% blasts.  REVIEW OF SYSTEMS: She continues to tolerate treatment remarkably well. There has been no fever, rash, bleeding, or pain. She also had no significant pain from the bone marrow biopsy. Her pacemaker is working well. A detailed review of systems today is entirely negative   PAST MEDICAL HISTORY: Past Medical History  Diagnosis Date  . Pacemaker     left shoulder  . Cataract     had surgery  . GERD (gastroesophageal reflux disease)   . Glaucoma   . Hyperlipidemia   . Osteoporosis   . Thyroid disease   . PUD (peptic ulcer disease)   . Lichen sclerosus et atrophicus of the vulva   . Intracranial hemorrhage     PAST SURGICAL HISTORY: Past Surgical History  Procedure Date  . Cataract extraction w/ intraocular lens  implant, bilateral   . Dilation and curettage of uterus   . Tonsillectomy and adenoidectomy   . Pacemaker insertion     left shoulder  . Subdural hematoma evacuation via craniotomy 12-22-2003   after a fall  . Colonoscopy     FAMILY HISTORY Family History  Problem Relation Age of Onset  . Colon cancer Mother    the patient's father died at the age of 38 from a myocardial infarction. Her mother was diagnosed with colon cancer at the age of 80 and died from that disease within a year. The patient had 2 sisters. One died at the age of 51 with a neck fracture. The patient has 2 brothers. There is no other history of cancer or hematologic problems in the family to her knowledge  GYNECOLOGIC HISTORY: She does not recall when she had menarche. She is GX P1, first live birth age 42. She went through the change of life in her 44s. She never took hormone replacement.  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 Her husband died in 1995/12/22 from a heart attack. The patient lives alone, with no pets. Her daughter, [name] lives in [place]. She has a PhD in Water quality scientist and works for [employer]. Her phone number is [phone].The patient's brother [name] lives next door and in case of an emergency she would like [name] to call him first. His phone number is [phone]   ADVANCED DIRECTIVES: in place  HEALTH MAINTENANCE: History  Substance Use Topics  . Smoking status: Never Smoker   . Smokeless tobacco: Never Used  . Alcohol Use: No     Colonoscopy: [facility]/ 2022-12-22  2012  PAP: "no longer"  Bone density: osteoporosis   Lipid panel: [name]             Mammogram: Nov 2012  Allergies  Allergen Reactions  . Sulfa Antibiotics     Pt. States she may have had a reaction a long time ago    Current Outpatient Prescriptions  Medication Sig Dispense Refill  . alendronate (FOSAMAX) 70 MG tablet Take 70 mg by mouth every 7 (seven) days. Take with a full glass of water on an empty stomach. Takes on Saturdays      . aspirin 325 MG EC tablet Take 325 mg by mouth every morning.       . bimatoprost (LUMIGAN) 0.03 %  ophthalmic solution Place 1 drop into both eyes at bedtime.        . brinzolamide (AZOPT) 1 % ophthalmic suspension Place 1 drop into both eyes 2 (two) times daily.        . Calcium Carbonate-Vitamin D (CALTRATE 600+D PO) Take 1 tablet by mouth 2 (two) times daily with breakfast and lunch.        . Cholecalciferol (VITAMIN D) 2000 UNITS tablet Take 2,000 Units by mouth 2 (two) times daily.        . Cyanocobalamin (VITAMIN B 12 PO) Take 2,000 mcg by mouth daily.      . Folic Acid 20 MG CAPS Take 40 mg by mouth 2 (two) times daily.      [name] levothyroxine (SYNTHROID, LEVOTHROID) 88 MCG tablet Take 88 mcg by mouth every morning.       . Multiple Vitamins-Minerals (MACULAR VITAMIN BENEFIT PO) Take 2 tablets by mouth daily.      [name] nystatin cream (MYCOSTATIN) Apply 1 application topically as needed.       . OMEGA-3 KRILL OIL 300 MG CAPS Take 1 capsule by mouth daily.        [name] omeprazole (PRILOSEC) 20 MG capsule Take 20 mg by mouth daily.        . pravastatin (PRAVACHOL) 40 MG tablet Take 40 mg by mouth daily.        . Zinc-Magnesium Aspart-Vit B6 (ZINC MAGNESIUM ASPARTATE PO) Take 1 tablet by mouth daily.          OBJECTIVE: Elderly white woman who appears well Filed Vitals:   06/17/12 1033  BP: 126/70  Pulse: 77  Temp: 97.9 F (36.6 C)  Resp: 20     Body mass index is 24.07 kg/(m^2).    ECOG FS: 0  Filed Weights   06/17/12 1033  Weight: 140 lb 3.2 oz (63.594 kg)   Physical Exam: HEENT:  Sclerae anicteric.  Oropharynx clear.  Nodes:  No cervical or supraclavicular lymphadenopathy  Breast Exam:  Deferred, no axillary adenopathy Lungs:  Clear to auscultation bilaterally.  Heart:  Regular rate and rhythm.  Pacemaker in place Abdomen:  Soft, nontender.  Positive bowel sounds. Musculoskeletal:  No focal spinal tenderness to palpation Extremities: No peripheral edema Neuro:  Nonfocal. Alert and oriented x3. Skin: No rash or other lesions  LAB RESULTS:   Lab Results  Component Value Date     WBC 2.8* 06/17/2012   NEUTROABS 1.4* 06/17/2012   HGB 10.9* 06/17/2012   HCT 31.1* 06/17/2012   MCV 105.4* 06/17/2012   PLT 410* 06/17/2012      Chemistry      Component Value Date/Time   NA 140 06/17/2012 1019   NA 141 02/06/2012 1047      Component  Value Date/Time   CALCIUM 9.0 06/17/2012 1019   CALCIUM 9.1 02/06/2012 1047       STUDIES: Ct Biopsy  06/12/2012  *RADIOLOGY REPORT*  Clinical data: Previous leukemia.  Persistent leukopenia.  CT GUIDED DEEP ILIAC BONE ASPIRATION AND CORE BIOPSY:  Technique: The procedure, risks (including but not limited to bleeding, infection, organ damage), benefits, and alternatives were explained to the patient.  Questions regarding the procedure were encouraged and answered.  The patient understands and consents to the procedure. Patient was placed supine on the CT gantry and limited axial scans through the pelvis were obtained. Appropriate skin entry site was identified. Skin site was marked, prepped with Betadine,   draped in usual sterile fashion, and infiltrated locally with 1% lidocaine.  Intravenous Fentanyl and Versed were administered as conscious sedation during continuous cardiorespiratory monitoring by the radiology RN, with a total moderate sedation time of 9 minutes.  Under CT fluoroscopic guidance an 11-gauge Cook trocar bone needle was advanced into the right iliac bone just lateral to the sacroiliac joint. Once needle tip position was confirmed, coaxial core and aspiration samples were obtained. The final sample was obtained using the guiding needle itself, which was then removed. Postprocedure scans show no hematoma or fracture. Patient tolerated procedure well, with no immediate complication.  IMPRESSION:  1. Technically successful CT guided right iliac bone core and aspiration biopsy.   Original Report Authenticated By: D. Andria Rhein, MD      ASSESSMENT: 78 y.o.  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   (1) started azacytidine 01/14/2012  (2) anemia, s/p transfusion 2 units PRBCs (02/04/2012, 02/28/2012)  (3) neutropenia  (4) thrombocytosis  PLAN:  The final bone marrow biopsy report is pending, but I have discussed it with Dr. Laureen Ochs and he tells me there are fewer than 5% blasts. There is still significant dysplasia, of course, an increased number of megakaryocytes. Peripherally it is encouraging that her platelet count is now almost normal.  [name] is tolerating treatment with no side effects whatsoever. She will be seeing Dr. Lowell Guitar next week, and I have tentatively scheduled her for a seventh cycle of azacytidine on December 9. She continues to be troubled by the bills she is receiving, and she has spoken to our billing specialist and also the billing folks in [place], working for [employer]. She would like to meet with our Dir.,Jeff Heffelfinger to discuss this further. Otherwise [name] will likely will receive an eighth cycle in January and a ninth cycle in February. She will see me again after the ninth cycle with repeat bone marrow biopsy, if we continue on the current treatment course. She knows to call for any problems that may develop before the next visit   [name] C    06/17/2012

## 2012-06-30 ENCOUNTER — Other Ambulatory Visit: Payer: Medicare Other | Admitting: Lab

## 2012-06-30 ENCOUNTER — Telehealth: Payer: Self-pay | Admitting: *Deleted

## 2012-06-30 ENCOUNTER — Other Ambulatory Visit: Payer: Self-pay | Admitting: Oncology

## 2012-06-30 ENCOUNTER — Ambulatory Visit (HOSPITAL_BASED_OUTPATIENT_CLINIC_OR_DEPARTMENT_OTHER): Payer: Medicare Other

## 2012-06-30 ENCOUNTER — Other Ambulatory Visit: Payer: Self-pay | Admitting: Physician Assistant

## 2012-06-30 VITALS — BP 98/67 | HR 75 | Temp 97.5°F

## 2012-06-30 DIAGNOSIS — D75839 Thrombocytosis, unspecified: Secondary | ICD-10-CM

## 2012-06-30 DIAGNOSIS — D649 Anemia, unspecified: Secondary | ICD-10-CM

## 2012-06-30 DIAGNOSIS — D473 Essential (hemorrhagic) thrombocythemia: Secondary | ICD-10-CM

## 2012-06-30 DIAGNOSIS — I629 Nontraumatic intracranial hemorrhage, unspecified: Secondary | ICD-10-CM

## 2012-06-30 DIAGNOSIS — Z5111 Encounter for antineoplastic chemotherapy: Secondary | ICD-10-CM

## 2012-06-30 DIAGNOSIS — D72819 Decreased white blood cell count, unspecified: Secondary | ICD-10-CM

## 2012-06-30 DIAGNOSIS — C9400 Acute erythroid leukemia, not having achieved remission: Secondary | ICD-10-CM

## 2012-06-30 MED ORDER — AZACITIDINE CHEMO SQ INJECTION
75.0000 mg/m2 | Freq: Once | INTRAMUSCULAR | Status: AC
  Administered 2012-06-30: 130 mg via SUBCUTANEOUS
  Filled 2012-06-30: qty 5.2

## 2012-06-30 MED ORDER — ONDANSETRON HCL 8 MG PO TABS
8.0000 mg | ORAL_TABLET | Freq: Once | ORAL | Status: AC
  Administered 2012-06-30: 8 mg via ORAL

## 2012-06-30 NOTE — Patient Instructions (Signed)
[facility] Discharge Instructions for Patients Receiving Chemotherapy  Today you received the following chemotherapy agents Vidaza  To help prevent nausea and vomiting after your treatment, we encourage you to take your nausea medication as prescribed.   If you develop nausea and vomiting that is not controlled by your nausea medication, call the clinic. If it is after clinic hours your family physician or the after hours number for the clinic or go to the Emergency Department.   BELOW ARE SYMPTOMS THAT SHOULD BE REPORTED IMMEDIATELY:  *FEVER GREATER THAN 100.5 F  *CHILLS WITH OR WITHOUT FEVER  NAUSEA AND VOMITING THAT IS NOT CONTROLLED WITH YOUR NAUSEA MEDICATION  *UNUSUAL SHORTNESS OF BREATH  *UNUSUAL BRUISING OR BLEEDING  TENDERNESS IN MOUTH AND THROAT WITH OR WITHOUT PRESENCE OF ULCERS  *URINARY PROBLEMS  *BOWEL PROBLEMS  UNUSUAL RASH Items with * indicate a potential emergency and should be followed up as soon as possible.  Feel free to call the clinic you have any questions or concerns. The clinic phone number is [phone].   I have been informed and understand all the instructions given to me. I know to contact the clinic, my physician, or go to the Emergency Department if any problems should occur. I do not have any questions at this time, but understand that I may call the clinic during office hours   should I have any questions or need assistance in obtaining follow up care.    __________________________________________  _____________  __________ Signature of Patient or Authorized Representative            Date                   Time    __________________________________________ Nurse's Signature    

## 2012-06-30 NOTE — Progress Notes (Signed)
Ok to treat with 11/26 labs per Amy Bery,PA

## 2012-06-30 NOTE — Telephone Encounter (Signed)
Move from AB to GM on 1/6 at 3:30 (AB out of officeon 1/6)   Sent michelle email to adjust treatment time changed for 07-28-2012

## 2012-07-01 ENCOUNTER — Ambulatory Visit (HOSPITAL_BASED_OUTPATIENT_CLINIC_OR_DEPARTMENT_OTHER): Payer: Medicare Other

## 2012-07-01 ENCOUNTER — Other Ambulatory Visit: Payer: Medicare Other | Admitting: Lab

## 2012-07-01 VITALS — BP 116/46 | HR 68 | Temp 97.6°F | Resp 20

## 2012-07-01 DIAGNOSIS — D473 Essential (hemorrhagic) thrombocythemia: Secondary | ICD-10-CM

## 2012-07-01 DIAGNOSIS — C9400 Acute erythroid leukemia, not having achieved remission: Secondary | ICD-10-CM

## 2012-07-01 DIAGNOSIS — I629 Nontraumatic intracranial hemorrhage, unspecified: Secondary | ICD-10-CM

## 2012-07-01 DIAGNOSIS — D649 Anemia, unspecified: Secondary | ICD-10-CM

## 2012-07-01 DIAGNOSIS — D72819 Decreased white blood cell count, unspecified: Secondary | ICD-10-CM

## 2012-07-01 DIAGNOSIS — Z5111 Encounter for antineoplastic chemotherapy: Secondary | ICD-10-CM

## 2012-07-01 DIAGNOSIS — D75839 Thrombocytosis, unspecified: Secondary | ICD-10-CM

## 2012-07-01 MED ORDER — AZACITIDINE CHEMO SQ INJECTION
75.0000 mg/m2 | Freq: Once | INTRAMUSCULAR | Status: AC
  Administered 2012-07-01: 130 mg via SUBCUTANEOUS
  Filled 2012-07-01: qty 26

## 2012-07-01 MED ORDER — ONDANSETRON HCL 8 MG PO TABS
8.0000 mg | ORAL_TABLET | Freq: Once | ORAL | Status: AC
  Administered 2012-07-01: 8 mg via ORAL

## 2012-07-02 ENCOUNTER — Ambulatory Visit (HOSPITAL_BASED_OUTPATIENT_CLINIC_OR_DEPARTMENT_OTHER): Payer: Medicare Other

## 2012-07-02 ENCOUNTER — Other Ambulatory Visit: Payer: Medicare Other | Admitting: Lab

## 2012-07-02 VITALS — BP 126/49 | HR 68 | Temp 97.0°F | Resp 18

## 2012-07-02 DIAGNOSIS — D72819 Decreased white blood cell count, unspecified: Secondary | ICD-10-CM

## 2012-07-02 DIAGNOSIS — Z5111 Encounter for antineoplastic chemotherapy: Secondary | ICD-10-CM

## 2012-07-02 DIAGNOSIS — D473 Essential (hemorrhagic) thrombocythemia: Secondary | ICD-10-CM

## 2012-07-02 DIAGNOSIS — D649 Anemia, unspecified: Secondary | ICD-10-CM

## 2012-07-02 DIAGNOSIS — I629 Nontraumatic intracranial hemorrhage, unspecified: Secondary | ICD-10-CM

## 2012-07-02 DIAGNOSIS — C9400 Acute erythroid leukemia, not having achieved remission: Secondary | ICD-10-CM

## 2012-07-02 MED ORDER — ONDANSETRON HCL 8 MG PO TABS
8.0000 mg | ORAL_TABLET | Freq: Once | ORAL | Status: AC
  Administered 2012-07-02: 8 mg via ORAL

## 2012-07-02 MED ORDER — AZACITIDINE CHEMO SQ INJECTION
75.0000 mg/m2 | Freq: Once | INTRAMUSCULAR | Status: AC
  Administered 2012-07-02: 130 mg via SUBCUTANEOUS
  Filled 2012-07-02: qty 26

## 2012-07-03 ENCOUNTER — Other Ambulatory Visit: Payer: Medicare Other | Admitting: Lab

## 2012-07-03 ENCOUNTER — Ambulatory Visit (HOSPITAL_BASED_OUTPATIENT_CLINIC_OR_DEPARTMENT_OTHER): Payer: Medicare Other

## 2012-07-03 VITALS — BP 118/52 | HR 72 | Temp 98.0°F

## 2012-07-03 DIAGNOSIS — D473 Essential (hemorrhagic) thrombocythemia: Secondary | ICD-10-CM

## 2012-07-03 DIAGNOSIS — Z5111 Encounter for antineoplastic chemotherapy: Secondary | ICD-10-CM

## 2012-07-03 DIAGNOSIS — I629 Nontraumatic intracranial hemorrhage, unspecified: Secondary | ICD-10-CM

## 2012-07-03 DIAGNOSIS — C9400 Acute erythroid leukemia, not having achieved remission: Secondary | ICD-10-CM

## 2012-07-03 DIAGNOSIS — D72819 Decreased white blood cell count, unspecified: Secondary | ICD-10-CM

## 2012-07-03 DIAGNOSIS — D649 Anemia, unspecified: Secondary | ICD-10-CM

## 2012-07-03 MED ORDER — ONDANSETRON HCL 8 MG PO TABS
8.0000 mg | ORAL_TABLET | Freq: Once | ORAL | Status: AC
  Administered 2012-07-03: 8 mg via ORAL

## 2012-07-03 MED ORDER — AZACITIDINE CHEMO SQ INJECTION
75.0000 mg/m2 | Freq: Once | INTRAMUSCULAR | Status: AC
  Administered 2012-07-03: 130 mg via SUBCUTANEOUS
  Filled 2012-07-03: qty 5.2

## 2012-07-03 NOTE — Patient Instructions (Signed)
Call MD for problems 

## 2012-07-04 ENCOUNTER — Ambulatory Visit (HOSPITAL_BASED_OUTPATIENT_CLINIC_OR_DEPARTMENT_OTHER): Payer: Medicare Other

## 2012-07-04 ENCOUNTER — Other Ambulatory Visit: Payer: Medicare Other | Admitting: Lab

## 2012-07-04 VITALS — BP 130/54 | HR 71 | Temp 98.2°F

## 2012-07-04 DIAGNOSIS — D473 Essential (hemorrhagic) thrombocythemia: Secondary | ICD-10-CM

## 2012-07-04 DIAGNOSIS — Z5111 Encounter for antineoplastic chemotherapy: Secondary | ICD-10-CM

## 2012-07-04 DIAGNOSIS — D649 Anemia, unspecified: Secondary | ICD-10-CM

## 2012-07-04 DIAGNOSIS — C9400 Acute erythroid leukemia, not having achieved remission: Secondary | ICD-10-CM

## 2012-07-04 DIAGNOSIS — D72819 Decreased white blood cell count, unspecified: Secondary | ICD-10-CM

## 2012-07-04 DIAGNOSIS — I629 Nontraumatic intracranial hemorrhage, unspecified: Secondary | ICD-10-CM

## 2012-07-04 MED ORDER — ONDANSETRON HCL 8 MG PO TABS
8.0000 mg | ORAL_TABLET | Freq: Once | ORAL | Status: AC
  Administered 2012-07-04: 8 mg via ORAL

## 2012-07-04 MED ORDER — AZACITIDINE CHEMO SQ INJECTION
75.0000 mg/m2 | Freq: Once | INTRAMUSCULAR | Status: AC
  Administered 2012-07-04: 130 mg via SUBCUTANEOUS
  Filled 2012-07-04: qty 26

## 2012-07-04 NOTE — Patient Instructions (Signed)
Call MD for problems 

## 2012-07-07 ENCOUNTER — Ambulatory Visit (HOSPITAL_BASED_OUTPATIENT_CLINIC_OR_DEPARTMENT_OTHER): Payer: Medicare Other

## 2012-07-07 ENCOUNTER — Other Ambulatory Visit: Payer: Medicare Other | Admitting: Lab

## 2012-07-07 VITALS — BP 119/57 | HR 81 | Temp 98.4°F

## 2012-07-07 DIAGNOSIS — Z5111 Encounter for antineoplastic chemotherapy: Secondary | ICD-10-CM

## 2012-07-07 DIAGNOSIS — I629 Nontraumatic intracranial hemorrhage, unspecified: Secondary | ICD-10-CM

## 2012-07-07 DIAGNOSIS — D649 Anemia, unspecified: Secondary | ICD-10-CM

## 2012-07-07 DIAGNOSIS — D72819 Decreased white blood cell count, unspecified: Secondary | ICD-10-CM

## 2012-07-07 DIAGNOSIS — D473 Essential (hemorrhagic) thrombocythemia: Secondary | ICD-10-CM

## 2012-07-07 DIAGNOSIS — C9400 Acute erythroid leukemia, not having achieved remission: Secondary | ICD-10-CM

## 2012-07-07 MED ORDER — ONDANSETRON HCL 8 MG PO TABS
8.0000 mg | ORAL_TABLET | Freq: Once | ORAL | Status: AC
  Administered 2012-07-07: 8 mg via ORAL

## 2012-07-07 MED ORDER — AZACITIDINE CHEMO SQ INJECTION
75.0000 mg/m2 | Freq: Once | INTRAMUSCULAR | Status: AC
  Administered 2012-07-07: 130 mg via SUBCUTANEOUS
  Filled 2012-07-07: qty 26

## 2012-07-07 NOTE — Patient Instructions (Addendum)
[facility] Discharge Instructions for Patients Receiving Chemotherapy  Today you received the following chemotherapy agents Vidaza  To help prevent nausea and vomiting after your treatment, we encourage you to take your nausea medication as prescribed.   If you develop nausea and vomiting that is not controlled by your nausea medication, call the clinic. If it is after clinic hours your family physician or the after hours number for the clinic or go to the Emergency Department.   BELOW ARE SYMPTOMS THAT SHOULD BE REPORTED IMMEDIATELY:  *FEVER GREATER THAN 100.5 F  *CHILLS WITH OR WITHOUT FEVER  NAUSEA AND VOMITING THAT IS NOT CONTROLLED WITH YOUR NAUSEA MEDICATION  *UNUSUAL SHORTNESS OF BREATH  *UNUSUAL BRUISING OR BLEEDING  TENDERNESS IN MOUTH AND THROAT WITH OR WITHOUT PRESENCE OF ULCERS  *URINARY PROBLEMS  *BOWEL PROBLEMS  UNUSUAL RASH Items with * indicate a potential emergency and should be followed up as soon as possible.  Feel free to call the clinic you have any questions or concerns. The clinic phone number is [phone].   I have been informed and understand all the instructions given to me. I know to contact the clinic, my physician, or go to the Emergency Department if any problems should occur. I do not have any questions at this time, but understand that I may call the clinic during office hours   should I have any questions or need assistance in obtaining follow up care.    __________________________________________  _____________  __________ Signature of Patient or Authorized Representative            Date                   Time    __________________________________________ Nurse's Signature    

## 2012-07-08 ENCOUNTER — Other Ambulatory Visit: Payer: Self-pay | Admitting: *Deleted

## 2012-07-08 ENCOUNTER — Other Ambulatory Visit (HOSPITAL_BASED_OUTPATIENT_CLINIC_OR_DEPARTMENT_OTHER): Payer: Medicare Other | Admitting: Lab

## 2012-07-08 ENCOUNTER — Other Ambulatory Visit: Payer: Medicare Other | Admitting: Lab

## 2012-07-08 ENCOUNTER — Ambulatory Visit (HOSPITAL_BASED_OUTPATIENT_CLINIC_OR_DEPARTMENT_OTHER): Payer: Medicare Other

## 2012-07-08 VITALS — BP 143/67 | HR 81 | Temp 97.5°F

## 2012-07-08 DIAGNOSIS — I629 Nontraumatic intracranial hemorrhage, unspecified: Secondary | ICD-10-CM

## 2012-07-08 DIAGNOSIS — D473 Essential (hemorrhagic) thrombocythemia: Secondary | ICD-10-CM

## 2012-07-08 DIAGNOSIS — D649 Anemia, unspecified: Secondary | ICD-10-CM

## 2012-07-08 DIAGNOSIS — D75839 Thrombocytosis, unspecified: Secondary | ICD-10-CM

## 2012-07-08 DIAGNOSIS — D72819 Decreased white blood cell count, unspecified: Secondary | ICD-10-CM

## 2012-07-08 DIAGNOSIS — C9400 Acute erythroid leukemia, not having achieved remission: Secondary | ICD-10-CM

## 2012-07-08 DIAGNOSIS — Z5111 Encounter for antineoplastic chemotherapy: Secondary | ICD-10-CM

## 2012-07-08 LAB — CBC WITH DIFFERENTIAL/PLATELET
EOS%: 1.9 % (ref 0.0–7.0)
Eosinophils Absolute: 0 10*3/uL (ref 0.0–0.5)
LYMPH%: 46.9 % (ref 14.0–49.7)
MCH: 36.1 pg — ABNORMAL HIGH (ref 25.1–34.0)
MCHC: 35 g/dL (ref 31.5–36.0)
MCV: 102.9 fL — ABNORMAL HIGH (ref 79.5–101.0)
MONO%: 5.5 % (ref 0.0–14.0)
NEUT#: 1.1 10*3/uL — ABNORMAL LOW (ref 1.5–6.5)
Platelets: 476 10*3/uL — ABNORMAL HIGH (ref 145–400)
RBC: 3.05 10*6/uL — ABNORMAL LOW (ref 3.70–5.45)

## 2012-07-08 MED ORDER — ONDANSETRON HCL 8 MG PO TABS
8.0000 mg | ORAL_TABLET | Freq: Once | ORAL | Status: AC
  Administered 2012-07-08: 8 mg via ORAL

## 2012-07-08 MED ORDER — AZACITIDINE CHEMO SQ INJECTION
75.0000 mg/m2 | Freq: Once | INTRAMUSCULAR | Status: AC
  Administered 2012-07-08: 130 mg via SUBCUTANEOUS
  Filled 2012-07-08: qty 26

## 2012-07-08 NOTE — Patient Instructions (Signed)
[facility] Discharge Instructions for Patients Receiving Chemotherapy  Today you received the following chemotherapy agents Vidaza  To help prevent nausea and vomiting after your treatment, we encourage you to take your nausea medication as prescribed.   If you develop nausea and vomiting that is not controlled by your nausea medication, call the clinic. If it is after clinic hours your family physician or the after hours number for the clinic or go to the Emergency Department.   BELOW ARE SYMPTOMS THAT SHOULD BE REPORTED IMMEDIATELY:  *FEVER GREATER THAN 100.5 F  *CHILLS WITH OR WITHOUT FEVER  NAUSEA AND VOMITING THAT IS NOT CONTROLLED WITH YOUR NAUSEA MEDICATION  *UNUSUAL SHORTNESS OF BREATH  *UNUSUAL BRUISING OR BLEEDING  TENDERNESS IN MOUTH AND THROAT WITH OR WITHOUT PRESENCE OF ULCERS  *URINARY PROBLEMS  *BOWEL PROBLEMS  UNUSUAL RASH Items with * indicate a potential emergency and should be followed up as soon as possible.  Feel free to call the clinic you have any questions or concerns. The clinic phone number is [phone].   I have been informed and understand all the instructions given to me. I know to contact the clinic, my physician, or go to the Emergency Department if any problems should occur. I do not have any questions at this time, but understand that I may call the clinic during office hours   should I have any questions or need assistance in obtaining follow up care.

## 2012-07-10 ENCOUNTER — Encounter: Payer: Self-pay | Admitting: Oncology

## 2012-07-28 ENCOUNTER — Other Ambulatory Visit: Payer: Self-pay | Admitting: Emergency Medicine

## 2012-07-28 ENCOUNTER — Ambulatory Visit (HOSPITAL_BASED_OUTPATIENT_CLINIC_OR_DEPARTMENT_OTHER): Payer: Medicare Other

## 2012-07-28 ENCOUNTER — Other Ambulatory Visit (HOSPITAL_BASED_OUTPATIENT_CLINIC_OR_DEPARTMENT_OTHER): Payer: Medicare Other | Admitting: Lab

## 2012-07-28 ENCOUNTER — Ambulatory Visit (HOSPITAL_BASED_OUTPATIENT_CLINIC_OR_DEPARTMENT_OTHER): Payer: Medicare Other | Admitting: Oncology

## 2012-07-28 ENCOUNTER — Other Ambulatory Visit: Payer: Self-pay | Admitting: *Deleted

## 2012-07-28 VITALS — BP 146/72 | HR 69 | Temp 97.2°F | Resp 20 | Ht 64.0 in | Wt 140.4 lb

## 2012-07-28 DIAGNOSIS — D649 Anemia, unspecified: Secondary | ICD-10-CM

## 2012-07-28 DIAGNOSIS — C9400 Acute erythroid leukemia, not having achieved remission: Secondary | ICD-10-CM

## 2012-07-28 DIAGNOSIS — D473 Essential (hemorrhagic) thrombocythemia: Secondary | ICD-10-CM

## 2012-07-28 DIAGNOSIS — I629 Nontraumatic intracranial hemorrhage, unspecified: Secondary | ICD-10-CM

## 2012-07-28 DIAGNOSIS — D72819 Decreased white blood cell count, unspecified: Secondary | ICD-10-CM

## 2012-07-28 DIAGNOSIS — Z5111 Encounter for antineoplastic chemotherapy: Secondary | ICD-10-CM

## 2012-07-28 LAB — CBC WITH DIFFERENTIAL/PLATELET
Basophils Absolute: 0 10*3/uL (ref 0.0–0.1)
Eosinophils Absolute: 0 10*3/uL (ref 0.0–0.5)
HGB: 11.3 g/dL — ABNORMAL LOW (ref 11.6–15.9)
MONO#: 0.1 10*3/uL (ref 0.1–0.9)
NEUT#: 1 10*3/uL — ABNORMAL LOW (ref 1.5–6.5)
RDW: 13.5 % (ref 11.2–14.5)
lymph#: 1.1 10*3/uL (ref 0.9–3.3)

## 2012-07-28 MED ORDER — ONDANSETRON HCL 8 MG PO TABS
8.0000 mg | ORAL_TABLET | Freq: Once | ORAL | Status: AC
  Administered 2012-07-28: 8 mg via ORAL

## 2012-07-28 MED ORDER — AZACITIDINE CHEMO SQ INJECTION
75.0000 mg/m2 | Freq: Once | INTRAMUSCULAR | Status: AC
  Administered 2012-07-28: 130 mg via SUBCUTANEOUS
  Filled 2012-07-28: qty 26

## 2012-07-28 NOTE — Progress Notes (Signed)
ID: [name]   DOB: 1928/10/14  MR#: [id number]  BJY#:[id number]  HISTORY OF PRESENT ILLNESS: The patient sees Dr. [name] for routine health maintenance. On March 2013 he noted that the patient's hemoglobin was 8.7 (previously it had been 11.1). The MCV was at 107.1. The platelet count was 660,000. The white cell count was 4.2. He obtained a B12 and folate which were within normal limits at 1744 and 8.4 respectively.[name] He checked a ferritin to evaluate the thrombocytosis, and this was 151. Serum protein electrophoresis did not show an M spike and the urine protein electrophoresis was likewise negative. He referred the patient for further evaluation. 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 Today is day 1 cycle 8 of 9 planned cycles of azacytidine. She tells me her daughter came down for the holidays, and they had a good time together. She also tells me her daughter is having carpal tunnel syndrome surgery and once her to come over and help during that time. This may of require some adjustment in her treatment dates.  REVIEW OF SYSTEMS: Lajoya is doing remarkably well overall. She has had no fever bleeding or bruising problems. She tolerates the treatment well, although it makes her bowel movements a little hard (she still has one every day). A detailed review of systems today was otherwise entirely negative  PAST MEDICAL HISTORY: Past Medical History  Diagnosis Date  . Pacemaker     left shoulder  . Cataract     had surgery  . GERD (gastroesophageal reflux disease)   . Glaucoma   . Hyperlipidemia   . Osteoporosis   . Thyroid disease   . PUD (peptic ulcer disease)   . Lichen sclerosus et atrophicus of the vulva   . Intracranial hemorrhage     PAST SURGICAL HISTORY: Past Surgical History  Procedure Date  . Cataract extraction w/ intraocular lens  implant, bilateral   . Dilation and  curettage of uterus   . Tonsillectomy and adenoidectomy   . Pacemaker insertion     left shoulder  . Subdural hematoma evacuation via craniotomy 2003/12/30    after a fall  . Colonoscopy     FAMILY HISTORY Family History  Problem Relation Age of Onset  . Colon cancer Mother    the patient's father died at the age of 36 from a myocardial infarction. Her mother was diagnosed with colon cancer at the age of 32 and died from that disease within a year. The patient had 2 sisters. One died at the age of 66 with a neck fracture. The patient has 2 brothers. There is no other history of cancer or hematologic problems in the family to her knowledge  GYNECOLOGIC HISTORY: She does not recall when she had menarche. She is GX P1, first live birth age 20. She went through the change of life in her 66s. She never took hormone replacement.  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 She had top clearance. Her husband died in 12/30/1995 from a heart attack. The patient lives alone, with no pets. Her daughter, [name] lives in [place]. She has a PhD in Water quality scientist and works for [employer]. Her phone number is [phone].The patient's brother [name] lives next door and in case of an emergency she would like [name] to call him first. His phone number is [phone]   ADVANCED DIRECTIVES: in place  HEALTH MAINTENANCE: History  Substance Use Topics  . Smoking status: Never Smoker   . Smokeless tobacco: Never Used  . Alcohol Use: No     Colonoscopy: [facility]/ May 2012  PAP: "no longer"  Bone density: osteoporosis   Lipid panel: [name]             Mammogram: Nov 2012  Allergies  Allergen Reactions  . Sulfa Antibiotics     Pt. States she may have had a reaction a long time ago    Current Outpatient Prescriptions  Medication Sig Dispense Refill  . alendronate (FOSAMAX) 70 MG tablet Take 70 mg by mouth every 7 (seven) days. Take with a full glass of  water on an empty stomach. Takes on Saturdays      . aspirin 325 MG EC tablet Take 325 mg by mouth every morning.       . bimatoprost (LUMIGAN) 0.03 % ophthalmic solution Place 1 drop into both eyes at bedtime.        . brinzolamide (AZOPT) 1 % ophthalmic suspension Place 1 drop into both eyes 2 (two) times daily.        . Calcium Carbonate-Vitamin D (CALTRATE 600+D PO) Take 1 tablet by mouth 2 (two) times daily with breakfast and lunch.        . Cholecalciferol (VITAMIN D) 2000 UNITS tablet Take 2,000 Units by mouth 2 (two) times daily.        . Cyanocobalamin (VITAMIN B 12 PO) Take 2,000 mcg by mouth daily.      . Folic Acid 20 MG CAPS Take 40 mg by mouth 2 (two) times daily.      [name] levothyroxine (SYNTHROID, LEVOTHROID) 88 MCG tablet Take 88 mcg by mouth every morning.       . Multiple Vitamins-Minerals (MACULAR VITAMIN BENEFIT PO) Take 2 tablets by mouth daily.      [name] nystatin cream (MYCOSTATIN) Apply 1 application topically as needed.       . OMEGA-3 KRILL OIL 300 MG CAPS Take 1 capsule by mouth daily.        [name] omeprazole (PRILOSEC) 20 MG capsule Take 20 mg by mouth daily.        . pravastatin (PRAVACHOL) 40 MG tablet Take 40 mg by mouth daily.        . Zinc-Magnesium Aspart-Vit B6 (ZINC MAGNESIUM ASPARTATE PO) Take 1 tablet by mouth daily.          OBJECTIVE: Elderly white woman who appears well Filed Vitals:   07/28/12 1506  BP: 146/72  Pulse: 69  Temp: 97.2 F (36.2 C)  Resp: 20     Body mass index is 24.10 kg/(m^2).    ECOG FS: 0  Filed Weights   07/28/12 1506  Weight: 140 lb 6.4 oz (63.685 kg)   Physical Exam: HEENT:  Sclerae anicteric.  Oropharynx clear.  Nodes:  No cervical or supraclavicular lymphadenopathy  Breast Exam:  Deferred, no axillary adenopathy Lungs:  Clear to auscultation bilaterally.  Heart:  Regular rate and rhythm.  Pacemaker in place Abdomen:  Soft, nontender.  Positive bowel sounds. Musculoskeletal:  No focal spinal tenderness  Extremities: No  peripheral edema Neuro:  Nonfocal. Alert and oriented x3. Positive affect Skin: No rash or other lesions  LAB RESULTS:   Lab Results  Component Value Date   WBC 2.1* 07/28/2012   NEUTROABS 1.0* 07/28/2012   HGB 11.3* 07/28/2012   HCT 31.6* 07/28/2012   MCV 101.8* 07/28/2012   PLT 381 07/28/2012  Chemistry      Component Value Date/Time   NA 140 06/17/2012 1019   NA 141 02/06/2012 1047      Component Value Date/Time   CALCIUM 9.0 06/17/2012 1019   CALCIUM 9.1 02/06/2012 1047       STUDIES: Patient Name: RAMIA, SIDNEY Accession #: [id number] DOB: 10-03-1928 Age: 70 Gender: F Client Name [facility] Collected Date: 06/12/2012 Received Date: 06/12/2012 Physician: D. Oley Balm Chart #: MRN # : [id number] Physician cc: Ruthann Cancer, MD Race:W Visit #: [id number] FLOW CYTOMETRY REPORT INTERPRETATION Interpretation Bone Marrow Flow Cytometry - NO SIGNIFICANT BLASTIC POPULATION IDENTIFIED. - NO MONOCLONAL B CELL POPULATION OR ABNORMAL T CELL PHENOTYPE IDENTIFIED. Guerry Bruin MD Pathologist, Electronic Signature (Case signed 06/17/2012) GROSS AND MICROSCOPIC INFORMATION Source Bone Marrow Flow Cytometry Microscopic Gated population: Flow cytometric immunophenotyping is performed using antibiodies to the antigens listed in the table below. Electronic gates are placed around a cell cluster displaying light scatter properties corresponding to lymphocytes and blasts. - Abnormal Cells in gated population: NA - Phenotype of Abnormal Cells: NA 1 of 2 FINAL for KENETRA, HILDENBRAND [id number]) Specimen Table Lymphoid Associated Myeloid Associated Misc. Associated CD2 tested CD19 tested CD11c tested CD45 tested CD3 tested CD20 tested CD13 tested HLA-DR tested CD4 tested CD21 tested CD14 tested CD10 tested CD5 tested CD22 tested CD15 tested CD56/16 ND CD7 tested CD23 tested CD33 tested ZAP70 ND CD8 tested CD103 ND MPO ND CD34 tested CD25 ND FMC7 ND CD117 tested CD52 ND  sKappa tested CD38 ND sLambda tested cKappa ND cLambda ND Gross Ref. [id number]. All controls stained appropriately. The above tests were developed and their performance characteristics determined by the [facility]. They have not been cleared or approved by the U.S. Food and Drug Administration." Report signed from the following location(s) [facility] [address], [place], [place] [address]. CLIA #: [id number],  Patient Name: LUCILLA, PETRENKO Accession #: [id number] DOB: May 21, 1929 Age: 16 Gender: F Client Name [facility] Collected Date: 06/12/2012 Received Date: 06/12/2012 Physician: D. 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 - SEE COMMENT. PERIPHERAL BLOOD: - MACROCYTIC ANEMIA. - LEUKOPENIA. - THROMBOCYTOSIS. Diagnosis Note The background shows dyserythropoiesis associated with abundant abnormal megakaryocytes likely representing changes related to the previously diagnosed disease process and/or therapy. However, as compared to the previous bone marrow ([id number]), the blastic cells are not increased in the current material and represent less than 5% of all cells as seen by morphology, immunohistochemical stains and flow cytometric analysis. Correlation with cytogenetic studies is recommended. (BNS:gt, 07/10/12) Guerry Bruin MD Pathologist, Electronic Signature (Case signed 06/17/2012) GROSS AND MICROSCOPIC INFORMATION Specimen Clinical Information eval erythroid leukemia, myelodysplasia [jl] Source Bone Marrow, Aspirate,Biopsy, and Clot, right iliac Microscopic LAB DATA: CBC performed on 06/12/12 shows: 1 of 3 FINAL for Szatkowski, Shaunta J ([id number]) Microscopic(continued) WBC  2.04 K/ul Neutrophils 49% HB 10.0 g/dl Lymphocytes 23% HCT 55.7 % Monocytes 3% MCV 100.7 fL Eosinophils 3% RDW 13.4 % Basophils 0% PLT 603 K/ul PERIPHERAL BLOOD SMEAR: The red blood cells display mild anisocytosis with macrocytic and normocytic cells. There is mild poikilocytosis with elliptocytes and teardrop cells. There is minimal polychromasia. The white blood cells are decreased in number. The neutrophilic cells display mild toxic granulation. Significant neutrophilic left shift is not seen on  scan. The platelets are increased in number. BONE MARROW ASPIRATE: Erythroid precursors: Progressive maturation with nuclear cytoplasmic dyssynchrony or irregular nuclei. Granulocytic precursors: Progressive maturation present. Maturing cells display mild toxic granulation in some cells. The blastic cells represent 2% of all cells with no Auer rods identified. Megakaryocytes: Increased in number with many small hypolobated forms or forms with separate nuclear lobes. Lymphocytes/plasma cells: Large aggregates not present. TOUCH PREPARATIONS: A mixture of cell types present. CLOT and BIOPSY: The core biopsy shows extensive artifacts. In a few relatively small intact areas as well as the clot sections, the cellularity is estimated at 30 to 50% with a mixture of cell types. Expansile sheets of blastic cells are not identified. Immunohistochemical stains for CD117 (C-kit), CD34, E-cadherin and myeloperoxidase were performed on clot and biopsy. Myeloperoxidase highlights the granulocytic component present throughout. E-cadherin shows several predominantly small clusters representing erythroid precursors. Significant CD117 positivity is not identified. CD34 shows variable positivity but generally consists of scattering of positive cells and rare small clusters composed of few cells. IRON STAIN: Iron stains are performed on a bone marrow aspirate smear and section of clot. The controls  stained appropriately. Storage Iron: Increased. Ringed Sideroblasts: Scattered cells present. ADDITIONAL DATA / TESTING: Specimen was sent for cytogenetic analysis and a separate report will follow. Flow cytometric analysis ([id number]) failed to show any significant blastic population. Additionally, analysis of the lymphoid population failed to show any monoclonal B cell population or abnormal T cell phenotype. (BNS:kh 06-17-12) 2 of 3 FINAL for ADALYND, DONAHOE [phone]) Specimen Table Bone Marrow count performed on 500 cells shows: Blasts: 2% Myeloid 44% Promyelocyts: 2% Myelocytes: 6% Erythroid 39% Metamyelocyts: 6% Bands: 10% Lymphocytes: 14% Neutrophils: 8% Eosinophils: 10% Plasma Cells: 2% Basophils: 0% Monocytes: 1% M:E ratio: 1.1 Gross Received in [facility] is a scant amount of tan red, soft material which is submitted in one block labeled A. Also received in [facility] in a separate container are two cores of tan, firm tissue, 0.8 x 0.3 cm and 1.5 x 0.2 cm, submitted in one block labeled B following decalcification. (SW:eps 06/12/12) Stain(s) Used in Diagnosis The following stain(s) were used in diagnosing the case: MPO, E-CAD, CD117 (C-KIT), Iron Stain, CD 34. The control(s) stained appropriately. Report signed out from following location(s) [facility] [address], [place], [place] [address]. CLIA #: [id number], 3 of  ASSESSMENT: 79 y.o.  [place] woman with progressive anemia, leukopenia, and thrombocytosis, with a very elevated MCV, but normal B12, folate, ferritin, and protein electrophoresis, and negative bcr-abl and Jak-2 probes; with a bone marrow biopsy May 2013 suggestive of RARS-M, subsequently read as most consistent with a t(3,3) positive acute leukemia..   (1) started azacytidine 01/14/2012  (2) anemia, s/p transfusion 2 units PRBCs (02/04/2012, 02/28/2012)  (3) neutropenia  (4) thrombocytosis  PLAN:  Valari is doing terrific. Her bone  marrow shows less than 5% blasts, her thrombocytosis has resolved, and she has not required transfusion since August. We are continuing with this cycle and the next, and then she will have a repeat bone marrow biopsy and see Dr. Lowell Guitar again. My suspicion is that we will either cut her treatments to 5 days, or to longer intervals of perhaps 6 weeks, or perhaps both, at that time.  She continues to be concerned regarding billing, and I think appropriately so. She is working with our billing counselors and management to see you she can get a good understanding of what she is being billed for.  She knows  to call for any problems that may develop before the next visit.   MAGRINAT,GUSTAV C    07/28/2012

## 2012-07-28 NOTE — Patient Instructions (Addendum)
[facility] Discharge Instructions for Patients Receiving Chemotherapy  Today you received the following chemotherapy agents vidaza  To help prevent nausea and vomiting after your treatment, we encourage you to take your nausea medication   and take it as often as prescribed.   If you develop nausea and vomiting that is not controlled by your nausea medication, call the clinic. If it is after clinic hours your family physician or the after hours number for the clinic or go to the Emergency Department.   BELOW ARE SYMPTOMS THAT SHOULD BE REPORTED IMMEDIATELY:  *FEVER GREATER THAN 100.5 F  *CHILLS WITH OR WITHOUT FEVER  NAUSEA AND VOMITING THAT IS NOT CONTROLLED WITH YOUR NAUSEA MEDICATION  *UNUSUAL SHORTNESS OF BREATH  *UNUSUAL BRUISING OR BLEEDING  TENDERNESS IN MOUTH AND THROAT WITH OR WITHOUT PRESENCE OF ULCERS  *URINARY PROBLEMS  *BOWEL PROBLEMS  UNUSUAL RASH Items with * indicate a potential emergency and should be followed up as soon as possible.  One of the nurses will contact you 24 hours after your treatment. Please let the nurse know about any problems that you may have experienced. Feel free to call the clinic you have any questions or concerns. The clinic phone number is [phone].   I have been informed and understand all the instructions given to me. I know to contact the clinic, my physician, or go to the Emergency Department if any problems should occur. I do not have any questions at this time, but understand that I may call the clinic during office hours   should I have any questions or need assistance in obtaining follow up care.    __________________________________________  _____________  __________ Signature of Patient or Authorized Representative            Date                   Time    __________________________________________ Nurse's Signature    

## 2012-07-29 ENCOUNTER — Ambulatory Visit (HOSPITAL_BASED_OUTPATIENT_CLINIC_OR_DEPARTMENT_OTHER): Payer: Medicare Other

## 2012-07-29 VITALS — BP 122/53 | HR 60 | Temp 97.4°F | Resp 20

## 2012-07-29 DIAGNOSIS — D72819 Decreased white blood cell count, unspecified: Secondary | ICD-10-CM

## 2012-07-29 DIAGNOSIS — I629 Nontraumatic intracranial hemorrhage, unspecified: Secondary | ICD-10-CM

## 2012-07-29 DIAGNOSIS — D649 Anemia, unspecified: Secondary | ICD-10-CM

## 2012-07-29 DIAGNOSIS — C9400 Acute erythroid leukemia, not having achieved remission: Secondary | ICD-10-CM

## 2012-07-29 DIAGNOSIS — Z5111 Encounter for antineoplastic chemotherapy: Secondary | ICD-10-CM

## 2012-07-29 DIAGNOSIS — D473 Essential (hemorrhagic) thrombocythemia: Secondary | ICD-10-CM

## 2012-07-29 MED ORDER — AZACITIDINE CHEMO SQ INJECTION
75.0000 mg/m2 | Freq: Once | INTRAMUSCULAR | Status: AC
  Administered 2012-07-29: 130 mg via SUBCUTANEOUS
  Filled 2012-07-29: qty 26

## 2012-07-29 MED ORDER — ONDANSETRON HCL 8 MG PO TABS
8.0000 mg | ORAL_TABLET | Freq: Once | ORAL | Status: AC
  Administered 2012-07-29: 8 mg via ORAL

## 2012-07-30 ENCOUNTER — Telehealth: Payer: Self-pay | Admitting: Oncology

## 2012-07-30 ENCOUNTER — Ambulatory Visit (HOSPITAL_BASED_OUTPATIENT_CLINIC_OR_DEPARTMENT_OTHER): Payer: Medicare Other

## 2012-07-30 VITALS — BP 130/67 | HR 88 | Temp 98.2°F | Resp 20

## 2012-07-30 DIAGNOSIS — I629 Nontraumatic intracranial hemorrhage, unspecified: Secondary | ICD-10-CM

## 2012-07-30 DIAGNOSIS — D649 Anemia, unspecified: Secondary | ICD-10-CM

## 2012-07-30 DIAGNOSIS — C9400 Acute erythroid leukemia, not having achieved remission: Secondary | ICD-10-CM

## 2012-07-30 DIAGNOSIS — Z5111 Encounter for antineoplastic chemotherapy: Secondary | ICD-10-CM

## 2012-07-30 DIAGNOSIS — D473 Essential (hemorrhagic) thrombocythemia: Secondary | ICD-10-CM

## 2012-07-30 DIAGNOSIS — D72819 Decreased white blood cell count, unspecified: Secondary | ICD-10-CM

## 2012-07-30 MED ORDER — ONDANSETRON HCL 8 MG PO TABS
8.0000 mg | ORAL_TABLET | Freq: Once | ORAL | Status: AC
  Administered 2012-07-30: 8 mg via ORAL

## 2012-07-30 MED ORDER — AZACITIDINE CHEMO SQ INJECTION
75.0000 mg/m2 | Freq: Once | INTRAMUSCULAR | Status: AC
  Administered 2012-07-30: 130 mg via SUBCUTANEOUS
  Filled 2012-07-30: qty 26

## 2012-07-30 NOTE — Telephone Encounter (Signed)
S/W [name] FROM RAD SCHEDULING AND THEY ARE AWARE THIS PT NEEDS TO BE SCHEDULED FOR HER BIOPSY

## 2012-07-31 ENCOUNTER — Ambulatory Visit (HOSPITAL_BASED_OUTPATIENT_CLINIC_OR_DEPARTMENT_OTHER): Payer: Medicare Other

## 2012-07-31 VITALS — BP 127/55 | HR 78 | Temp 97.4°F

## 2012-07-31 DIAGNOSIS — D72819 Decreased white blood cell count, unspecified: Secondary | ICD-10-CM

## 2012-07-31 DIAGNOSIS — Z5111 Encounter for antineoplastic chemotherapy: Secondary | ICD-10-CM

## 2012-07-31 DIAGNOSIS — D649 Anemia, unspecified: Secondary | ICD-10-CM

## 2012-07-31 DIAGNOSIS — I629 Nontraumatic intracranial hemorrhage, unspecified: Secondary | ICD-10-CM

## 2012-07-31 DIAGNOSIS — D473 Essential (hemorrhagic) thrombocythemia: Secondary | ICD-10-CM

## 2012-07-31 DIAGNOSIS — C9400 Acute erythroid leukemia, not having achieved remission: Secondary | ICD-10-CM

## 2012-07-31 MED ORDER — AZACITIDINE CHEMO SQ INJECTION
75.0000 mg/m2 | Freq: Once | INTRAMUSCULAR | Status: AC
  Administered 2012-07-31: 130 mg via SUBCUTANEOUS
  Filled 2012-07-31: qty 26

## 2012-07-31 MED ORDER — ONDANSETRON HCL 8 MG PO TABS
8.0000 mg | ORAL_TABLET | Freq: Once | ORAL | Status: AC
  Administered 2012-07-31: 8 mg via ORAL

## 2012-07-31 NOTE — Patient Instructions (Signed)
Call MD for problems 

## 2012-08-01 ENCOUNTER — Ambulatory Visit (HOSPITAL_BASED_OUTPATIENT_CLINIC_OR_DEPARTMENT_OTHER): Payer: Medicare Other

## 2012-08-01 VITALS — BP 140/88 | HR 71 | Temp 97.8°F

## 2012-08-01 DIAGNOSIS — D473 Essential (hemorrhagic) thrombocythemia: Secondary | ICD-10-CM

## 2012-08-01 DIAGNOSIS — D649 Anemia, unspecified: Secondary | ICD-10-CM

## 2012-08-01 DIAGNOSIS — I629 Nontraumatic intracranial hemorrhage, unspecified: Secondary | ICD-10-CM

## 2012-08-01 DIAGNOSIS — Z5111 Encounter for antineoplastic chemotherapy: Secondary | ICD-10-CM

## 2012-08-01 DIAGNOSIS — D75839 Thrombocytosis, unspecified: Secondary | ICD-10-CM

## 2012-08-01 DIAGNOSIS — D72819 Decreased white blood cell count, unspecified: Secondary | ICD-10-CM

## 2012-08-01 DIAGNOSIS — C9400 Acute erythroid leukemia, not having achieved remission: Secondary | ICD-10-CM

## 2012-08-01 MED ORDER — AZACITIDINE CHEMO SQ INJECTION
75.0000 mg/m2 | Freq: Once | INTRAMUSCULAR | Status: AC
  Administered 2012-08-01: 130 mg via SUBCUTANEOUS
  Filled 2012-08-01: qty 26

## 2012-08-01 MED ORDER — ONDANSETRON HCL 8 MG PO TABS
8.0000 mg | ORAL_TABLET | Freq: Once | ORAL | Status: AC
  Administered 2012-08-01: 8 mg via ORAL

## 2012-08-01 NOTE — Patient Instructions (Signed)
Call MD for problems 

## 2012-08-04 ENCOUNTER — Ambulatory Visit (HOSPITAL_BASED_OUTPATIENT_CLINIC_OR_DEPARTMENT_OTHER): Payer: Medicare Other

## 2012-08-04 VITALS — BP 123/65 | HR 79 | Temp 97.1°F

## 2012-08-04 DIAGNOSIS — D649 Anemia, unspecified: Secondary | ICD-10-CM

## 2012-08-04 DIAGNOSIS — C9400 Acute erythroid leukemia, not having achieved remission: Secondary | ICD-10-CM

## 2012-08-04 DIAGNOSIS — D72819 Decreased white blood cell count, unspecified: Secondary | ICD-10-CM

## 2012-08-04 DIAGNOSIS — I629 Nontraumatic intracranial hemorrhage, unspecified: Secondary | ICD-10-CM

## 2012-08-04 DIAGNOSIS — Z5111 Encounter for antineoplastic chemotherapy: Secondary | ICD-10-CM

## 2012-08-04 DIAGNOSIS — D473 Essential (hemorrhagic) thrombocythemia: Secondary | ICD-10-CM

## 2012-08-04 MED ORDER — AZACITIDINE CHEMO SQ INJECTION
75.0000 mg/m2 | Freq: Once | INTRAMUSCULAR | Status: AC
  Administered 2012-08-04: 130 mg via SUBCUTANEOUS
  Filled 2012-08-04: qty 26

## 2012-08-04 MED ORDER — ONDANSETRON HCL 8 MG PO TABS
8.0000 mg | ORAL_TABLET | Freq: Once | ORAL | Status: AC
  Administered 2012-08-04: 8 mg via ORAL

## 2012-08-04 NOTE — Patient Instructions (Addendum)
[facility] Discharge Instructions for Patients Receiving Chemotherapy  Today you received the following chemotherapy agents Vidaza  To help prevent nausea and vomiting after your treatment, we encourage you to take your nausea medication as prescribed.   If you develop nausea and vomiting that is not controlled by your nausea medication, call the clinic. If it is after clinic hours your family physician or the after hours number for the clinic or go to the Emergency Department.   BELOW ARE SYMPTOMS THAT SHOULD BE REPORTED IMMEDIATELY:  *FEVER GREATER THAN 100.5 F  *CHILLS WITH OR WITHOUT FEVER  NAUSEA AND VOMITING THAT IS NOT CONTROLLED WITH YOUR NAUSEA MEDICATION  *UNUSUAL SHORTNESS OF BREATH  *UNUSUAL BRUISING OR BLEEDING  TENDERNESS IN MOUTH AND THROAT WITH OR WITHOUT PRESENCE OF ULCERS  *URINARY PROBLEMS  *BOWEL PROBLEMS  UNUSUAL RASH Items with * indicate a potential emergency and should be followed up as soon as possible.  Feel free to call the clinic you have any questions or concerns. The clinic phone number is [phone].   I have been informed and understand all the instructions given to me. I know to contact the clinic, my physician, or go to the Emergency Department if any problems should occur. I do not have any questions at this time, but understand that I may call the clinic during office hours   should I have any questions or need assistance in obtaining follow up care.    __________________________________________  _____________  __________ Signature of Patient or Authorized Representative            Date                   Time    __________________________________________ Nurse's Signature    

## 2012-08-05 ENCOUNTER — Ambulatory Visit (HOSPITAL_BASED_OUTPATIENT_CLINIC_OR_DEPARTMENT_OTHER): Payer: Medicare Other

## 2012-08-05 VITALS — BP 116/66 | HR 94 | Temp 98.5°F | Resp 20

## 2012-08-05 DIAGNOSIS — D473 Essential (hemorrhagic) thrombocythemia: Secondary | ICD-10-CM

## 2012-08-05 DIAGNOSIS — I629 Nontraumatic intracranial hemorrhage, unspecified: Secondary | ICD-10-CM

## 2012-08-05 DIAGNOSIS — D72819 Decreased white blood cell count, unspecified: Secondary | ICD-10-CM

## 2012-08-05 DIAGNOSIS — C9400 Acute erythroid leukemia, not having achieved remission: Secondary | ICD-10-CM

## 2012-08-05 DIAGNOSIS — Z5111 Encounter for antineoplastic chemotherapy: Secondary | ICD-10-CM

## 2012-08-05 DIAGNOSIS — D649 Anemia, unspecified: Secondary | ICD-10-CM

## 2012-08-05 MED ORDER — AZACITIDINE CHEMO SQ INJECTION
75.0000 mg/m2 | Freq: Once | INTRAMUSCULAR | Status: AC
  Administered 2012-08-05: 130 mg via SUBCUTANEOUS
  Filled 2012-08-05: qty 26

## 2012-08-05 MED ORDER — ONDANSETRON HCL 8 MG PO TABS
8.0000 mg | ORAL_TABLET | Freq: Once | ORAL | Status: AC
  Administered 2012-08-05: 8 mg via ORAL

## 2012-08-05 NOTE — Patient Instructions (Addendum)
[facility] Discharge Instructions for Patients Receiving Chemotherapy  Today you received the following chemotherapy agents vidaza  To help prevent nausea and vomiting after your treatment, we encourage you to take your nausea medication   and take it as often as prescribed.   If you develop nausea and vomiting that is not controlled by your nausea medication, call the clinic. If it is after clinic hours your family physician or the after hours number for the clinic or go to the Emergency Department.   BELOW ARE SYMPTOMS THAT SHOULD BE REPORTED IMMEDIATELY:  *FEVER GREATER THAN 100.5 F  *CHILLS WITH OR WITHOUT FEVER  NAUSEA AND VOMITING THAT IS NOT CONTROLLED WITH YOUR NAUSEA MEDICATION  *UNUSUAL SHORTNESS OF BREATH  *UNUSUAL BRUISING OR BLEEDING  TENDERNESS IN MOUTH AND THROAT WITH OR WITHOUT PRESENCE OF ULCERS  *URINARY PROBLEMS  *BOWEL PROBLEMS  UNUSUAL RASH Items with * indicate a potential emergency and should be followed up as soon as possible.  One of the nurses will contact you 24 hours after your treatment. Please let the nurse know about any problems that you may have experienced. Feel free to call the clinic you have any questions or concerns. The clinic phone number is [phone].   I have been informed and understand all the instructions given to me. I know to contact the clinic, my physician, or go to the Emergency Department if any problems should occur. I do not have any questions at this time, but understand that I may call the clinic during office hours   should I have any questions or need assistance in obtaining follow up care.    __________________________________________  _____________  __________ Signature of Patient or Authorized Representative            Date                   Time    __________________________________________ Nurse's Signature    

## 2012-08-25 ENCOUNTER — Ambulatory Visit (HOSPITAL_BASED_OUTPATIENT_CLINIC_OR_DEPARTMENT_OTHER): Payer: Medicare Other

## 2012-08-25 ENCOUNTER — Other Ambulatory Visit (HOSPITAL_BASED_OUTPATIENT_CLINIC_OR_DEPARTMENT_OTHER): Payer: Medicare Other

## 2012-08-25 ENCOUNTER — Telehealth: Payer: Self-pay | Admitting: Oncology

## 2012-08-25 ENCOUNTER — Encounter: Payer: Self-pay | Admitting: Physician Assistant

## 2012-08-25 ENCOUNTER — Ambulatory Visit (HOSPITAL_BASED_OUTPATIENT_CLINIC_OR_DEPARTMENT_OTHER): Payer: Medicare Other | Admitting: Physician Assistant

## 2012-08-25 VITALS — BP 139/72 | HR 81 | Temp 97.9°F | Resp 20 | Ht 64.0 in | Wt 142.1 lb

## 2012-08-25 DIAGNOSIS — D649 Anemia, unspecified: Secondary | ICD-10-CM

## 2012-08-25 DIAGNOSIS — C9400 Acute erythroid leukemia, not having achieved remission: Secondary | ICD-10-CM

## 2012-08-25 DIAGNOSIS — D72819 Decreased white blood cell count, unspecified: Secondary | ICD-10-CM

## 2012-08-25 DIAGNOSIS — D473 Essential (hemorrhagic) thrombocythemia: Secondary | ICD-10-CM

## 2012-08-25 DIAGNOSIS — Z5111 Encounter for antineoplastic chemotherapy: Secondary | ICD-10-CM

## 2012-08-25 DIAGNOSIS — I629 Nontraumatic intracranial hemorrhage, unspecified: Secondary | ICD-10-CM

## 2012-08-25 DIAGNOSIS — D75839 Thrombocytosis, unspecified: Secondary | ICD-10-CM

## 2012-08-25 LAB — CBC WITH DIFFERENTIAL/PLATELET
Basophils Absolute: 0 10*3/uL (ref 0.0–0.1)
Eosinophils Absolute: 0 10*3/uL (ref 0.0–0.5)
HGB: 9.7 g/dL — ABNORMAL LOW (ref 11.6–15.9)
NEUT#: 0.7 10*3/uL — ABNORMAL LOW (ref 1.5–6.5)
RBC: 2.75 10*6/uL — ABNORMAL LOW (ref 3.70–5.45)
RDW: 14.6 % — ABNORMAL HIGH (ref 11.2–14.5)
WBC: 1.9 10*3/uL — ABNORMAL LOW (ref 3.9–10.3)
lymph#: 1.1 10*3/uL (ref 0.9–3.3)

## 2012-08-25 MED ORDER — AZACITIDINE CHEMO SQ INJECTION
75.0000 mg/m2 | Freq: Once | INTRAMUSCULAR | Status: AC
  Administered 2012-08-25: 130 mg via SUBCUTANEOUS
  Filled 2012-08-25: qty 5.2

## 2012-08-25 MED ORDER — ONDANSETRON HCL 8 MG PO TABS
8.0000 mg | ORAL_TABLET | Freq: Once | ORAL | Status: AC
  Administered 2012-08-25: 8 mg via ORAL

## 2012-08-25 NOTE — Patient Instructions (Signed)
[facility] Discharge Instructions for Patients Receiving Chemotherapy  Today you received the following chemotherapy agent: vidaza    To help prevent nausea and vomiting after your treatment, we encourage you to take your nausea medication if needed.   If you develop nausea and vomiting that is not controlled by your nausea medication, call the clinic. If it is after clinic hours your family physician or the after hours number for the clinic or go to the Emergency Department.   BELOW ARE SYMPTOMS THAT SHOULD BE REPORTED IMMEDIATELY:  *FEVER GREATER THAN 100.5 F  *CHILLS WITH OR WITHOUT FEVER  NAUSEA AND VOMITING THAT IS NOT CONTROLLED WITH YOUR NAUSEA MEDICATION  *UNUSUAL SHORTNESS OF BREATH  *UNUSUAL BRUISING OR BLEEDING  TENDERNESS IN MOUTH AND THROAT WITH OR WITHOUT PRESENCE OF ULCERS  *URINARY PROBLEMS  *BOWEL PROBLEMS  UNUSUAL RASH Items with * indicate a potential emergency and should be followed up as soon as possible.   Feel free to call the clinic you have any questions or concerns. The clinic phone number is [phone].   I have been informed and understand all the instructions given to me. I know to contact the clinic, my physician, or go to the Emergency Department if any problems should occur. I do not have any questions at this time, but understand that I may call the clinic during office hours   should I have any questions or need assistance in obtaining follow up care.    __________________________________________  _____________  __________ Signature of Patient or Authorized Representative            Date                   Time    __________________________________________ Nurse's Signature

## 2012-08-25 NOTE — Telephone Encounter (Signed)
gv pt appt schedule for February. Informed [name] in HIM about appt w/Dr. Lowell Guitar @ [phone]. Pt aware she will be contacted re [facility] appt.

## 2012-08-25 NOTE — Progress Notes (Signed)
ID: [name]   DOB: 1929-04-09  MR#: [id number]  KGM#:[id number]  HISTORY OF PRESENT ILLNESS: The patient sees Dr. [name] for routine health maintenance. On March 2013 he noted that the patient's hemoglobin was 8.7 (previously it had been 11.1). The MCV was at 107.1. The platelet count was 660,000. The white cell count was 4.2. He obtained a B12 and folate which were within normal limits at 1744 and 8.4 respectively.[name] He checked a ferritin to evaluate the thrombocytosis, and this was 151. Serum protein electrophoresis did not show an M spike and the urine protein electrophoresis was likewise negative. He referred the patient for further evaluation. 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 Today is day 1 cycle 9 of 9 planned cycles of azacytidine. She receives 7 injections with each cycle.   [name] continues to tolerate treatment extremely well, and tells me she would "never know she was sick". Her energy level is good. She really has very few complaints.   REVIEW OF SYSTEMS: [name] has had no fever, chills, night sweats, bleeding or bruising problems. She's eating and drinking well with no nausea or change in bowel or bladder habits. She denies cough, shortness of breath, chest pain, or palpitations. She also denies any headaches, myalgias, arthralgias, bony pain, or peripheral swelling.   A detailed review of systems today was otherwise entirely negative.  PAST MEDICAL HISTORY: Past Medical History  Diagnosis Date  . Pacemaker     left shoulder  . Cataract     had surgery  . GERD (gastroesophageal reflux disease)   . Glaucoma   . Hyperlipidemia   . Osteoporosis   . Thyroid disease   . PUD (peptic ulcer disease)   . Lichen sclerosus et atrophicus of the vulva   . Intracranial hemorrhage     PAST SURGICAL HISTORY: Past Surgical History  Procedure Date  . Cataract extraction w/  intraocular lens  implant, bilateral   . Dilation and curettage of uterus   . Tonsillectomy and adenoidectomy   . Pacemaker insertion     left shoulder  . Subdural hematoma evacuation via craniotomy 31-Dec-2003    after a fall  . Colonoscopy     FAMILY HISTORY Family History  Problem Relation Age of Onset  . Colon cancer Mother    the patient's father died at the age of 80 from a myocardial infarction. Her mother was diagnosed with colon cancer at the age of 12 and died from that disease within a year. The patient had 2 sisters. One died at the age of 56 with a neck fracture. The patient has 2 brothers. There is no other history of cancer or hematologic problems in the family to her knowledge  GYNECOLOGIC HISTORY: She does not recall when she had menarche. She is GX P1, first live birth age 11. She went through the change of life in her 73s. She never took hormone replacement.  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 Her husband died in 31-Dec-1995 from a heart attack. The patient lives alone, with no pets. Her daughter, [name] lives in [place]. She has a PhD in Water quality scientist and works for [employer]. Her phone number is [phone].The patient's brother [name] lives next door and in case of an emergency she would like [name] to call him first. His phone number is [phone]  ADVANCED DIRECTIVES: in place  HEALTH MAINTENANCE: History  Substance Use Topics  . Smoking status: Never Smoker   . Smokeless tobacco: Never Used  . Alcohol Use: No     Colonoscopy: [facility]/ May 2012  PAP: "no longer"  Bone density: osteoporosis   Lipid panel: [name]             Mammogram: Nov 2012  Allergies  Allergen Reactions  . Sulfa Antibiotics     Pt. States she may have had a reaction a long time ago    Current Outpatient Prescriptions  Medication Sig Dispense Refill  . alendronate (FOSAMAX) 70 MG tablet Take 70 mg by  mouth every 7 (seven) days. Take with a full glass of water on an empty stomach. Takes on Saturdays      . aspirin 325 MG EC tablet Take 325 mg by mouth every morning.       . bimatoprost (LUMIGAN) 0.03 % ophthalmic solution Place 1 drop into both eyes at bedtime.        . brinzolamide (AZOPT) 1 % ophthalmic suspension Place 1 drop into both eyes 2 (two) times daily.        . Calcium Carbonate-Vitamin D (CALTRATE 600+D PO) Take 1 tablet by mouth 2 (two) times daily with breakfast and lunch.        . Cholecalciferol (VITAMIN D) 2000 UNITS tablet Take 2,000 Units by mouth 2 (two) times daily.        . Cyanocobalamin (VITAMIN B 12 PO) Take 2,000 mcg by mouth daily.      . Folic Acid 20 MG CAPS Take 40 mg by mouth 2 (two) times daily.      [name] levothyroxine (SYNTHROID, LEVOTHROID) 88 MCG tablet Take 88 mcg by mouth every morning.       . Multiple Vitamins-Minerals (MACULAR VITAMIN BENEFIT PO) Take 2 tablets by mouth daily.      [name] nystatin cream (MYCOSTATIN) Apply 1 application topically as needed.       . OMEGA-3 KRILL OIL 300 MG CAPS Take 1 capsule by mouth daily.        [name] omeprazole (PRILOSEC) 20 MG capsule Take 20 mg by mouth daily.        . pravastatin (PRAVACHOL) 40 MG tablet Take 40 mg by mouth daily.        . Zinc-Magnesium Aspart-Vit B6 (ZINC MAGNESIUM ASPARTATE PO) Take 1 tablet by mouth daily.          OBJECTIVE: Elderly white woman who appears well Filed Vitals:   08/25/12 1454  BP: 139/72  Pulse: 81  Temp: 97.9 F (36.6 C)  Resp: 20     Body mass index is 24.39 kg/(m^2).    ECOG FS: 0  Filed Weights   08/25/12 1454  Weight: 142 lb 1.6 oz (64.456 kg)   Physical Exam: HEENT:  Sclerae anicteric.  Oropharynx clear.  Nodes:  No cervical or supraclavicular lymphadenopathy  Breast Exam:  Deferred, no axillary adenopathy Lungs:  Clear to auscultation bilaterally.  Heart:  Regular rate and rhythm.  Pacemaker in place Abdomen:  Soft, nontender.  Positive bowel  sounds. Musculoskeletal:  No focal spinal tenderness  Extremities: No peripheral edema Neuro:  Nonfocal. Well oriented.   LAB RESULTS:   Lab Results  Component Value Date   WBC 1.9* 08/25/2012   NEUTROABS 0.7* 08/25/2012   HGB 9.7* 08/25/2012   HCT 27.5* 08/25/2012   MCV 100.0 08/25/2012   PLT 397 08/25/2012  Chemistry      Component Value Date/Time   NA 140 06/17/2012 1019   NA 141 02/06/2012 1047      Component Value Date/Time   CALCIUM 9.0 06/17/2012 1019   CALCIUM 9.1 02/06/2012 1047       STUDIES: Patient Name: [name], [name] Accession #: [id number] DOB: 1928/10/21 Age: 32 Gender: F Client Name [facility] Collected Date: 06/12/2012 Received Date: 06/12/2012 Physician: D. Oley Balm Chart #: MRN # : [id number] Physician cc: Ruthann Cancer, MD Race:W Visit #: [id number] FLOCYTOMETRY REPORT INTERPRETATION Interpretation Bone Marrow Flow Cytometry - NO SIGNIFICANT BLASTIC POPULATION IDENTIFIED. - NO MONOCLONAL B CELL POPULATION OR ABNORMAL T CELL PHENOTYPE IDENTIFIED. Guerry Bruin MD Pathologist, Electronic Signature (Case signed 06/17/2012) GROSS AND MICROSCOPIC INFORMATION Source Bone Marrow Flow Cytometry Microscopic Gated population: Flow cytometric immunophenotyping is performed using antibiodies to the antigens listed in the table below. Electronic gates are placed around a cell cluster displaying light scatter properties corresponding to lymphocytes and blasts. - Abnormal Cells in gated population: NA - Phenotype of Abnormal Cells: NA 1 of 2 FINAL for [name], [name] [phone]) Specimen Table Lymphoid Associated Myeloid Associated Misc. Associated CD2 tested CD19 tested CD11c tested CD45 tested CD3 tested CD20 tested CD13 tested HLA-DR tested CD4 tested CD21 tested CD14 tested CD10 tested CD5 tested CD22 tested CD15 tested CD56/16 ND CD7 tested CD23 tested CD33 tested ZAP70 ND CD8 tested CD103 ND MPO ND CD34 tested CD25 ND FMC7 ND CD117  tested CD52 ND sKappa tested CD38 ND sLambda tested cKappa ND cLambda ND Gross Ref. [id number]. All controls stained appropriately. The above tests were developed and their performance characteristics determined by the [facility]. They have not been cleared or approved by the U.S. Food and Drug Administration." Report signed from the following location(s) [facility] [address], [place], [place] [address]. CLIA #: [id number],  Patient Name: [name], [name] Accession #: [id number] DOB: 1928/10/20 Age: 46 Gender: F Client Name [facility] Collected Date: 06/12/2012 Received Date: 06/12/2012 Physician: D. 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 - SEE COMMENT. PERIPHERAL BLOOD: - MACROCYTIC ANEMIA. - LEUKOPENIA. - THROMBOCYTOSIS. Diagnosis Note The background shows dyserythropoiesis associated with abundant abnormal megakaryocytes likely representing changes related to the previously diagnosed disease process and/or therapy. However, as compared to the previous bone marrow ([id number]), the blastic cells are not increased in the current material and represent less than 5% of all cells as seen by morphology, immunohistochemical stains and flow cytometric analysis. Correlation with cytogenetic studies is recommended. (BNS:gt, 04-Jul-2012) Guerry Bruin MD Pathologist, Electronic Signature (Case signed 06/17/2012) GROSS AND MICROSCOPIC INFORMATION Specimen Clinical Information eval erythroid leukemia, myelodysplasia [jl] Source Bone Marrow, Aspirate,Biopsy, and Clot, right iliac Microscopic LAB DATA: CBC performed on 06/12/12 shows: 1 of 3 FINAL for [name], [name]  ([id number]) Microscopic(continued) WBC 2.04 K/ul Neutrophils 49% HB 10.0 g/dl Lymphocytes 23% HCT 55.7 % Monocytes 3% MCV 100.7 fL Eosinophils 3% RDW 13.4 % Basophils 0% PLT 603 K/ul PERIPHERAL BLOOD SMEAR: The red blood cells display mild anisocytosis with macrocytic and normocytic cells. There is mild poikilocytosis with elliptocytes and teardrop cells. There is minimal polychromasia. The white blood cells are decreased in number. The neutrophilic cells display mild toxic granulation. Significant neutrophilic left shift is not seen on scan.  The platelets are increased in number. BONE MARROW ASPIRATE: Erythroid precursors: Progressive maturation with nuclear cytoplasmic dyssynchrony or irregular nuclei. Granulocytic precursors: Progressive maturation present. Maturing cells display mild toxic granulation in some cells. The blastic cells represent 2% of all cells with no Auer rods identified. Megakaryocytes: Increased in number with many small hypolobated forms or forms with separate nuclear lobes. Lymphocytes/plasma cells: Large aggregates not present. TOUCH PREPARATIONS: A mixture of cell types present. CLOT and BIOPSY: The core biopsy shows extensive artifacts. In a few relatively small intact areas as well as the clot sections, the cellularity is estimated at 30 to 50% with a mixture of cell types. Expansile sheets of blastic cells are not identified. Immunohistochemical stains for CD117 (C-kit), CD34, E-cadherin and myeloperoxidase were performed on clot and biopsy. Myeloperoxidase highlights the granulocytic component present throughout. E-cadherin shows several predominantly small clusters representing erythroid precursors. Significant CD117 positivity is not identified. CD34 shows variable positivity but generally consists of scattering of positive cells and rare small clusters composed of few cells. IRON STAIN: Iron stains are performed on a bone marrow aspirate smear and section  of clot. The controls stained appropriately. Storage Iron: Increased. Ringed Sideroblasts: Scattered cells present. ADDITIONAL DATA / TESTING: Specimen was sent for cytogenetic analysis and a separate report will follow. Flow cytometric analysis ([id number]) failed to show any significant blastic population. Additionally, analysis of the lymphoid population failed to show any monoclonal B cell population or abnormal T cell phenotype. (BNS:kh 06-17-12) 2 of 3 FINAL for [name], [name] [phone]) Specimen Table Bone Marrow count performed on 500 cells shows: Blasts: 2% Myeloid 44% Promyelocyts: 2% Myelocytes: 6% Erythroid 39% Metamyelocyts: 6% Bands: 10% Lymphocytes: 14% Neutrophils: 8% Eosinophils: 10% Plasma Cells: 2% Basophils: 0% Monocytes: 1% M:E ratio: 1.1 Gross Received in [employer] is a scant amount of tan red, soft material which is submitted in one block labeled A. Also received in [employer] in a separate container are two cores of tan, firm tissue, 0.8 x 0.3 cm and 1.5 x 0.2 cm, submitted in one block labeled B following decalcification. (SW:eps 06/12/12) Stain(s) Used in Diagnosis The following stain(s) were used in diagnosing the case: MPO, E-CAD, CD117 (C-KIT), Iron Stain, CD 34. The control(s) stained appropriately. Report signed out from following location(s) [facility] [address], [place], [place] [address]. CLIA #: [id number], 3 of  ASSESSMENT: 79 y.o.  [place] woman with progressive anemia, leukopenia, and thrombocytosis, with a very elevated MCV, but normal B12, folate, ferritin, and protein electrophoresis, and negative bcr-abl and Jak-2 probes; with a bone marrow biopsy May 2013 suggestive of RARS-M, subsequently read as most consistent with a t(3,3) positive acute leukemia..   (1) started azacytidine 01/14/2012  (2) anemia, s/p transfusion 2 units PRBCs (02/04/2012, 02/28/2012)  (3) neutropenia  (4) thrombocytosis  PLAN:  [name]  continues to do terrific, and will proceed to treatment today as scheduled for day 1 cycle 9 of azacitidine. Per Dr. Darnelle Catalan planned, she will receive cycle 9, then will have a repeat bone marrow biopsy in February 19. She's already scheduled to see Dr. Darnelle Catalan on February 24, and we are also scheduling her to be seen by Dr. Lowell Guitar at [facility] around the same time.  I will mention that [name] is hoping to schedule a flight to visit her daughter the first week of March to help her following carpal tunnel surgery. This may or may not necessitate a delay in cycle 10. Dr. Darnelle Catalan thoughts are that, following the bone marrow biopsy,  we will either cut her treatments to 5 days, or to longer intervals of perhaps 6 weeks, or perhaps both, at that time.  She knows to call for any problems that may develop before the next visit.   Savon Bordonaro    08/25/2012

## 2012-08-26 ENCOUNTER — Ambulatory Visit (HOSPITAL_BASED_OUTPATIENT_CLINIC_OR_DEPARTMENT_OTHER): Payer: Medicare Other

## 2012-08-26 ENCOUNTER — Telehealth: Payer: Self-pay | Admitting: Oncology

## 2012-08-26 VITALS — BP 136/82 | HR 80 | Temp 97.6°F | Resp 20

## 2012-08-26 DIAGNOSIS — Z5111 Encounter for antineoplastic chemotherapy: Secondary | ICD-10-CM

## 2012-08-26 DIAGNOSIS — C9400 Acute erythroid leukemia, not having achieved remission: Secondary | ICD-10-CM

## 2012-08-26 DIAGNOSIS — D649 Anemia, unspecified: Secondary | ICD-10-CM

## 2012-08-26 DIAGNOSIS — I629 Nontraumatic intracranial hemorrhage, unspecified: Secondary | ICD-10-CM

## 2012-08-26 DIAGNOSIS — D72819 Decreased white blood cell count, unspecified: Secondary | ICD-10-CM

## 2012-08-26 DIAGNOSIS — D473 Essential (hemorrhagic) thrombocythemia: Secondary | ICD-10-CM

## 2012-08-26 MED ORDER — ONDANSETRON HCL 8 MG PO TABS
8.0000 mg | ORAL_TABLET | Freq: Once | ORAL | Status: AC
  Administered 2012-08-26: 8 mg via ORAL

## 2012-08-26 MED ORDER — AZACITIDINE CHEMO SQ INJECTION
75.0000 mg/m2 | Freq: Once | INTRAMUSCULAR | Status: AC
Start: 2012-08-26 — End: 2012-08-26
  Administered 2012-08-26: 130 mg via SUBCUTANEOUS
  Filled 2012-08-26: qty 26

## 2012-08-26 NOTE — Telephone Encounter (Signed)
Pt appt. With Dr. Lowell Guitar @ [facility] 09/16/12@3 :00. Medical records faxed. Pt is aware

## 2012-08-26 NOTE — Patient Instructions (Signed)
[facility] Discharge Instructions for Patients Receiving Chemotherapy  Today you received the following chemotherapy agents Vidaza    If you develop nausea and vomiting that is not controlled by your nausea medication, call the clinic. If it is after clinic hours your family physician or the after hours number for the clinic or go to the Emergency Department.   BELOW ARE SYMPTOMS THAT SHOULD BE REPORTED IMMEDIATELY:  *FEVER GREATER THAN 100.5 F  *CHILLS WITH OR WITHOUT FEVER  NAUSEA AND VOMITING THAT IS NOT CONTROLLED WITH YOUR NAUSEA MEDICATION  *UNUSUAL SHORTNESS OF BREATH  *UNUSUAL BRUISING OR BLEEDING  TENDERNESS IN MOUTH AND THROAT WITH OR WITHOUT PRESENCE OF ULCERS  *URINARY PROBLEMS  *BOWEL PROBLEMS  UNUSUAL RASH Items with * indicate a potential emergency and should be followed up as soon as possible.  One of the nurses will contact you 24 hours after your treatment. Please let the nurse know about any problems that you may have experienced. Feel free to call the clinic you have any questions or concerns. The clinic phone number is [phone].   I have been informed and understand all the instructions given to me. I know to contact the clinic, my physician, or go to the Emergency Department if any problems should occur. I do not have any questions at this time, but understand that I may call the clinic during office hours   should I have any questions or need assistance in obtaining follow up care.    __________________________________________  _____________  __________ Signature of Patient or Authorized Representative            Date                   Time    __________________________________________ Nurse's Signature

## 2012-08-27 ENCOUNTER — Ambulatory Visit (HOSPITAL_BASED_OUTPATIENT_CLINIC_OR_DEPARTMENT_OTHER): Payer: Medicare Other

## 2012-08-27 ENCOUNTER — Other Ambulatory Visit: Payer: Self-pay | Admitting: Certified Registered Nurse Anesthetist

## 2012-08-27 VITALS — BP 125/71 | HR 78 | Temp 98.0°F

## 2012-08-27 DIAGNOSIS — Z5111 Encounter for antineoplastic chemotherapy: Secondary | ICD-10-CM

## 2012-08-27 DIAGNOSIS — D473 Essential (hemorrhagic) thrombocythemia: Secondary | ICD-10-CM

## 2012-08-27 DIAGNOSIS — D649 Anemia, unspecified: Secondary | ICD-10-CM

## 2012-08-27 DIAGNOSIS — D72819 Decreased white blood cell count, unspecified: Secondary | ICD-10-CM

## 2012-08-27 DIAGNOSIS — C9400 Acute erythroid leukemia, not having achieved remission: Secondary | ICD-10-CM

## 2012-08-27 DIAGNOSIS — I629 Nontraumatic intracranial hemorrhage, unspecified: Secondary | ICD-10-CM

## 2012-08-27 MED ORDER — ONDANSETRON HCL 8 MG PO TABS
8.0000 mg | ORAL_TABLET | Freq: Once | ORAL | Status: AC
  Administered 2012-08-27: 8 mg via ORAL

## 2012-08-27 MED ORDER — AZACITIDINE CHEMO SQ INJECTION
75.0000 mg/m2 | Freq: Once | INTRAMUSCULAR | Status: AC
  Administered 2012-08-27: 130 mg via SUBCUTANEOUS
  Filled 2012-08-27: qty 26

## 2012-08-27 NOTE — Patient Instructions (Signed)
Call MD for problems 

## 2012-08-28 ENCOUNTER — Ambulatory Visit (HOSPITAL_BASED_OUTPATIENT_CLINIC_OR_DEPARTMENT_OTHER): Payer: Medicare Other

## 2012-08-28 VITALS — BP 117/71 | HR 77 | Temp 97.7°F

## 2012-08-28 DIAGNOSIS — D649 Anemia, unspecified: Secondary | ICD-10-CM

## 2012-08-28 DIAGNOSIS — I629 Nontraumatic intracranial hemorrhage, unspecified: Secondary | ICD-10-CM

## 2012-08-28 DIAGNOSIS — D75839 Thrombocytosis, unspecified: Secondary | ICD-10-CM

## 2012-08-28 DIAGNOSIS — D72819 Decreased white blood cell count, unspecified: Secondary | ICD-10-CM

## 2012-08-28 DIAGNOSIS — Z5111 Encounter for antineoplastic chemotherapy: Secondary | ICD-10-CM

## 2012-08-28 DIAGNOSIS — D473 Essential (hemorrhagic) thrombocythemia: Secondary | ICD-10-CM

## 2012-08-28 DIAGNOSIS — C9400 Acute erythroid leukemia, not having achieved remission: Secondary | ICD-10-CM

## 2012-08-28 MED ORDER — ONDANSETRON HCL 8 MG PO TABS
8.0000 mg | ORAL_TABLET | Freq: Once | ORAL | Status: AC
  Administered 2012-08-28: 8 mg via ORAL

## 2012-08-28 MED ORDER — AZACITIDINE CHEMO SQ INJECTION
75.0000 mg/m2 | Freq: Once | INTRAMUSCULAR | Status: AC
  Administered 2012-08-28: 130 mg via SUBCUTANEOUS
  Filled 2012-08-28: qty 26

## 2012-08-28 NOTE — Patient Instructions (Addendum)
[facility] Discharge Instructions for Patients Receiving Chemotherapy  Today you received the following chemotherapy agents Vidaza  To help prevent nausea and vomiting after your treatment, we encourage you to take your nausea medication as prescribed.   If you develop nausea and vomiting that is not controlled by your nausea medication, call the clinic. If it is after clinic hours your family physician or the after hours number for the clinic or go to the Emergency Department.   BELOW ARE SYMPTOMS THAT SHOULD BE REPORTED IMMEDIATELY:  *FEVER GREATER THAN 100.5 F  *CHILLS WITH OR WITHOUT FEVER  NAUSEA AND VOMITING THAT IS NOT CONTROLLED WITH YOUR NAUSEA MEDICATION  *UNUSUAL SHORTNESS OF BREATH  *UNUSUAL BRUISING OR BLEEDING  TENDERNESS IN MOUTH AND THROAT WITH OR WITHOUT PRESENCE OF ULCERS  *URINARY PROBLEMS  *BOWEL PROBLEMS  UNUSUAL RASH Items with * indicate a potential emergency and should be followed up as soon as possible.  Feel free to call the clinic you have any questions or concerns. The clinic phone number is [phone].   I have been informed and understand all the instructions given to me. I know to contact the clinic, my physician, or go to the Emergency Department if any problems should occur. I do not have any questions at this time, but understand that I may call the clinic during office hours   should I have any questions or need assistance in obtaining follow up care.    __________________________________________  _____________  __________ Signature of Patient or Authorized Representative            Date                   Time    __________________________________________ Nurse's Signature    

## 2012-08-29 ENCOUNTER — Ambulatory Visit (HOSPITAL_BASED_OUTPATIENT_CLINIC_OR_DEPARTMENT_OTHER): Payer: Medicare Other

## 2012-08-29 VITALS — BP 114/71 | HR 90 | Temp 98.2°F

## 2012-08-29 DIAGNOSIS — D473 Essential (hemorrhagic) thrombocythemia: Secondary | ICD-10-CM

## 2012-08-29 DIAGNOSIS — D649 Anemia, unspecified: Secondary | ICD-10-CM

## 2012-08-29 DIAGNOSIS — C9400 Acute erythroid leukemia, not having achieved remission: Secondary | ICD-10-CM

## 2012-08-29 DIAGNOSIS — I629 Nontraumatic intracranial hemorrhage, unspecified: Secondary | ICD-10-CM

## 2012-08-29 DIAGNOSIS — D75839 Thrombocytosis, unspecified: Secondary | ICD-10-CM

## 2012-08-29 DIAGNOSIS — Z5111 Encounter for antineoplastic chemotherapy: Secondary | ICD-10-CM

## 2012-08-29 DIAGNOSIS — D72819 Decreased white blood cell count, unspecified: Secondary | ICD-10-CM

## 2012-08-29 MED ORDER — AZACITIDINE CHEMO SQ INJECTION
75.0000 mg/m2 | Freq: Once | INTRAMUSCULAR | Status: AC
  Administered 2012-08-29: 130 mg via SUBCUTANEOUS
  Filled 2012-08-29: qty 26

## 2012-08-29 MED ORDER — ONDANSETRON HCL 8 MG PO TABS
8.0000 mg | ORAL_TABLET | Freq: Once | ORAL | Status: AC
  Administered 2012-08-29: 8 mg via ORAL

## 2012-08-29 NOTE — Patient Instructions (Signed)
Call MD for problems or concerns 

## 2012-09-01 ENCOUNTER — Other Ambulatory Visit (HOSPITAL_BASED_OUTPATIENT_CLINIC_OR_DEPARTMENT_OTHER): Payer: Medicare Other | Admitting: Lab

## 2012-09-01 ENCOUNTER — Ambulatory Visit (HOSPITAL_BASED_OUTPATIENT_CLINIC_OR_DEPARTMENT_OTHER): Payer: Medicare Other

## 2012-09-01 DIAGNOSIS — D649 Anemia, unspecified: Secondary | ICD-10-CM

## 2012-09-01 DIAGNOSIS — D72819 Decreased white blood cell count, unspecified: Secondary | ICD-10-CM

## 2012-09-01 DIAGNOSIS — D75839 Thrombocytosis, unspecified: Secondary | ICD-10-CM

## 2012-09-01 DIAGNOSIS — C9400 Acute erythroid leukemia, not having achieved remission: Secondary | ICD-10-CM

## 2012-09-01 DIAGNOSIS — D473 Essential (hemorrhagic) thrombocythemia: Secondary | ICD-10-CM

## 2012-09-01 DIAGNOSIS — Z5111 Encounter for antineoplastic chemotherapy: Secondary | ICD-10-CM

## 2012-09-01 DIAGNOSIS — I629 Nontraumatic intracranial hemorrhage, unspecified: Secondary | ICD-10-CM

## 2012-09-01 LAB — CBC WITH DIFFERENTIAL/PLATELET
BASO%: 0.7 % (ref 0.0–2.0)
Eosinophils Absolute: 0 10*3/uL (ref 0.0–0.5)
HCT: 26 % — ABNORMAL LOW (ref 34.8–46.6)
HGB: 9 g/dL — ABNORMAL LOW (ref 11.6–15.9)
LYMPH%: 61.2 % — ABNORMAL HIGH (ref 14.0–49.7)
MONO#: 0.1 10*3/uL (ref 0.1–0.9)
NEUT#: 0.6 10*3/uL — ABNORMAL LOW (ref 1.5–6.5)
NEUT%: 32.1 % — ABNORMAL LOW (ref 38.4–76.8)
Platelets: 489 10*3/uL — ABNORMAL HIGH (ref 145–400)
WBC: 1.8 10*3/uL — ABNORMAL LOW (ref 3.9–10.3)
lymph#: 1.1 10*3/uL (ref 0.9–3.3)

## 2012-09-01 MED ORDER — AZACITIDINE CHEMO SQ INJECTION
75.0000 mg/m2 | Freq: Once | INTRAMUSCULAR | Status: AC
  Administered 2012-09-01: 130 mg via SUBCUTANEOUS
  Filled 2012-09-01: qty 5.2

## 2012-09-01 MED ORDER — ONDANSETRON HCL 8 MG PO TABS
8.0000 mg | ORAL_TABLET | Freq: Once | ORAL | Status: AC
  Administered 2012-09-01: 8 mg via ORAL

## 2012-09-01 NOTE — Patient Instructions (Signed)
[facility] Discharge Instructions for Patients Receiving Chemotherapy  Today you received the following chemotherapy agents VIDAZA To help prevent nausea and vomiting after your treatment, we encourage you to take your nausea medication   Take it as often as prescribed.   If you develop nausea and vomiting that is not controlled by your nausea medication, call the clinic. If it is after clinic hours your family physician or the after hours number for the clinic or go to the Emergency Department.   BELOW ARE SYMPTOMS THAT SHOULD BE REPORTED IMMEDIATELY:  *FEVER GREATER THAN 100.5 F  *CHILLS WITH OR WITHOUT FEVER  NAUSEA AND VOMITING THAT IS NOT CONTROLLED WITH YOUR NAUSEA MEDICATION  *UNUSUAL SHORTNESS OF BREATH  *UNUSUAL BRUISING OR BLEEDING  TENDERNESS IN MOUTH AND THROAT WITH OR WITHOUT PRESENCE OF ULCERS  *URINARY PROBLEMS  *BOWEL PROBLEMS  UNUSUAL RASH Items with * indicate a potential emergency and should be followed up as soon as possible.  If this is your first treatment one of the nurses will contact you 24 hours after your treatment. Please let the nurse know about any problems that you may have experienced. Feel free to call the clinic you have any questions or concerns. The clinic phone number is [phone].   I have been informed and understand all the instructions given to me. I know to contact the clinic, my physician, or go to the Emergency Department if any problems should occur. I do not have any questions at this time, but understand that I may call the clinic during office hours   should I have any questions or need assistance in obtaining follow up care.    __________________________________________  _____________  __________ Signature of Patient or Authorized Representative            Date                   Time    __________________________________________ Nurse's Signature     

## 2012-09-01 NOTE — Progress Notes (Signed)
Ok to treat per Dr. Magrinat 

## 2012-09-02 ENCOUNTER — Ambulatory Visit (HOSPITAL_BASED_OUTPATIENT_CLINIC_OR_DEPARTMENT_OTHER): Payer: Medicare Other

## 2012-09-02 VITALS — BP 116/78 | HR 71 | Temp 97.9°F

## 2012-09-02 DIAGNOSIS — D72819 Decreased white blood cell count, unspecified: Secondary | ICD-10-CM

## 2012-09-02 DIAGNOSIS — I629 Nontraumatic intracranial hemorrhage, unspecified: Secondary | ICD-10-CM

## 2012-09-02 DIAGNOSIS — Z5111 Encounter for antineoplastic chemotherapy: Secondary | ICD-10-CM

## 2012-09-02 DIAGNOSIS — D473 Essential (hemorrhagic) thrombocythemia: Secondary | ICD-10-CM

## 2012-09-02 DIAGNOSIS — D649 Anemia, unspecified: Secondary | ICD-10-CM

## 2012-09-02 DIAGNOSIS — C9400 Acute erythroid leukemia, not having achieved remission: Secondary | ICD-10-CM

## 2012-09-02 MED ORDER — ONDANSETRON HCL 8 MG PO TABS
8.0000 mg | ORAL_TABLET | Freq: Once | ORAL | Status: AC
  Administered 2012-09-02: 8 mg via ORAL

## 2012-09-02 MED ORDER — AZACITIDINE CHEMO SQ INJECTION
75.0000 mg/m2 | Freq: Once | INTRAMUSCULAR | Status: AC
  Administered 2012-09-02: 130 mg via SUBCUTANEOUS
  Filled 2012-09-02: qty 5.2

## 2012-09-02 NOTE — Patient Instructions (Signed)
Azacitidine suspension for injection (subcutaneous use) What is this medicine? AZACITIDINE (ay za SITE i deen) is a chemotherapy drug. This medicine reduces the growth of cancer cells and can suppress the immune system. It is used for treating myelodysplastic syndrome or some types of leukemia. This medicine may be used for other purposes; ask your health care provider or pharmacist if you have questions. What should I tell my health care provider before I take this medicine? They need to know if you have any of these conditions: -infection (especially a virus infection such as chickenpox, cold sores, or herpes) -kidney disease -liver disease -liver tumors -an unusual or allergic reaction to azacitidine, mannitol, other medicines, foods, dyes, or preservatives -pregnant or trying to get pregnant -breast-feeding How should I use this medicine? This medicine is for injection under the skin. It is administered in a hospital or clinic by a specially trained health care professional. Talk to your pediatrician regarding the use of this medicine in children. While this drug may be prescribed for selected conditions, precautions do apply. Overdosage: If you think you have taken too much of this medicine contact a poison control center or emergency room at once. NOTE: This medicine is only for you. Do not share this medicine with others. What if I miss a dose? It is important not to miss your dose. Call your doctor or health care professional if you are unable to keep an appointment. What may interact with this medicine? -vaccines Talk to your doctor or health care professional before taking any of these medicines: -acetaminophen -aspirin -ibuprofen -ketoprofen -naproxen This list may not describe all possible interactions. Give your health care provider a list of all the medicines, herbs, non-prescription drugs, or dietary supplements you use. Also tell them if you smoke, drink alcohol, or use  illegal drugs. Some items may interact with your medicine. What should I watch for while using this medicine? Visit your doctor for checks on your progress. This drug may make you feel generally unwell. This is not uncommon, as chemotherapy can affect healthy cells as well as cancer cells. Report any side effects. Continue your course of treatment even though you feel ill unless your doctor tells you to stop. In some cases, you may be given additional medicines to help with side effects. Follow all directions for their use. Call your doctor or health care professional for advice if you get a fever, chills or sore throat, or other symptoms of a cold or flu. Do not treat yourself. This drug decreases your body's ability to fight infections. Try to avoid being around people who are sick. This medicine may increase your risk to bruise or bleed. Call your doctor or health care professional if you notice any unusual bleeding. Be careful brushing and flossing your teeth or using a toothpick because you may get an infection or bleed more easily. If you have any dental work done, tell your dentist you are receiving this medicine. Avoid taking products that contain aspirin, acetaminophen, ibuprofen, naproxen, or ketoprofen unless instructed by your doctor. These medicines may hide a fever. Do not have any vaccinations without your doctor's approval and avoid anyone who has recently had oral polio vaccine. Do not become pregnant while taking this medicine. Women should inform their doctor if they wish to become pregnant or think they might be pregnant. There is a potential for serious side effects to an unborn child. Talk to your health care professional or pharmacist for more information. Do not breast-feed an   infant while taking this medicine. If you are a man, you should not father a child while receiving treatment. What side effects may I notice from receiving this medicine? Side effects that you should report  to your doctor or health care professional as soon as possible: -allergic reactions like skin rash, itching or hives, swelling of the face, lips, or tongue -low blood counts - this medicine may decrease the number of white blood cells, red blood cells and platelets. You may be at increased risk for infections and bleeding. -signs of infection - fever or chills, cough, sore throat, pain or difficulty passing urine -signs of decreased platelets or bleeding - bruising, pinpoint red spots on the skin, black, tarry stools, blood in the urine -signs of decreased red blood cells - unusually weak or tired, fainting spells, lightheadedness -reactions at the injection site including redness, pain, itching, or bruising -breathing problems -changes in vision -fever -mouth sores -stomach pain -vomiting Side effects that usually do not require medical attention (report to your doctor or health care professional if they continue or are bothersome): -constipation -diarrhea -loss of appetite -nausea -pain or redness at the injection site -weak or tired This list may not describe all possible side effects. Call your doctor for medical advice about side effects. You may report side effects to FDA at 1-800-FDA-1088. Where should I keep my medicine? This drug is given in a hospital or clinic and will not be stored at home. NOTE: This sheet is a summary. It may not cover all possible information. If you have questions about this medicine, talk to your doctor, pharmacist, or health care provider.  2013, [employer]. (10/02/2007 11:04:07 AM)  

## 2012-09-03 ENCOUNTER — Encounter (HOSPITAL_COMMUNITY): Payer: Self-pay | Admitting: Pharmacy Technician

## 2012-09-05 ENCOUNTER — Other Ambulatory Visit: Payer: Self-pay | Admitting: Radiology

## 2012-09-10 ENCOUNTER — Ambulatory Visit (HOSPITAL_COMMUNITY)
Admission: RE | Admit: 2012-09-10 | Discharge: 2012-09-10 | Disposition: A | Discharge status: Home / Self Care | Payer: Medicare Other | Source: Ambulatory Visit | Attending: Oncology | Admitting: Oncology

## 2012-09-10 ENCOUNTER — Encounter (HOSPITAL_COMMUNITY): Payer: Self-pay

## 2012-09-10 DIAGNOSIS — Z01812 Encounter for preprocedural laboratory examination: Secondary | ICD-10-CM | POA: Insufficient documentation

## 2012-09-10 DIAGNOSIS — C9590 Leukemia, unspecified not having achieved remission: Secondary | ICD-10-CM | POA: Insufficient documentation

## 2012-09-10 DIAGNOSIS — C9400 Acute erythroid leukemia, not having achieved remission: Secondary | ICD-10-CM

## 2012-09-10 LAB — CBC
Hemoglobin: 8.5 g/dL — ABNORMAL LOW (ref 12.0–15.0)
MCH: 35.1 pg — ABNORMAL HIGH (ref 26.0–34.0)
MCHC: 36 g/dL (ref 30.0–36.0)
MCV: 97.5 fL (ref 78.0–100.0)
Platelets: 356 10*3/uL (ref 150–400)
RBC: 2.42 MIL/uL — ABNORMAL LOW (ref 3.87–5.11)

## 2012-09-10 LAB — PROTIME-INR: Prothrombin Time: 14.5 seconds (ref 11.6–15.2)

## 2012-09-10 MED ORDER — MIDAZOLAM HCL 2 MG/2ML IJ SOLN
INTRAMUSCULAR | Status: AC | PRN
  Administered 2012-09-10: 2 mg via INTRAVENOUS

## 2012-09-10 MED ORDER — MIDAZOLAM HCL 2 MG/2ML IJ SOLN
INTRAMUSCULAR | Status: AC
  Filled 2012-09-10: qty 6

## 2012-09-10 MED ORDER — HYDROCODONE-ACETAMINOPHEN 5-325 MG PO TABS
1.0000 | ORAL_TABLET | ORAL | Status: DC | PRN
  Filled 2012-09-10: qty 2

## 2012-09-10 MED ORDER — FENTANYL CITRATE 0.05 MG/ML IJ SOLN
INTRAMUSCULAR | Status: AC
  Filled 2012-09-10: qty 6

## 2012-09-10 MED ORDER — FENTANYL CITRATE 0.05 MG/ML IJ SOLN
INTRAMUSCULAR | Status: AC | PRN
  Administered 2012-09-10: 100 ug via INTRAVENOUS

## 2012-09-10 MED ORDER — SODIUM CHLORIDE 0.9 % IV SOLN
INTRAVENOUS | Status: DC
  Administered 2012-09-10: 08:00:00 via INTRAVENOUS

## 2012-09-10 NOTE — Progress Notes (Signed)
Spoke w Mr [name], ride home. And he will be here to pick her up when we call.

## 2012-09-10 NOTE — Procedures (Signed)
CT guided bone marrow biopsies. 2 aspirates and 2 core biopsies.  No immediate complication.

## 2012-09-10 NOTE — H&P (Signed)
[name] is an 79 y.o. female.   Chief Complaint: "I'm here for another bone marrow biopsy" HPI: Patient with history of erythroid leukemia/myelodysplasia presents today for CT guided bone marrow biopsy.  Past Medical History  Diagnosis Date  . Pacemaker     left shoulder  . Cataract     had surgery  . GERD (gastroesophageal reflux disease)   . Glaucoma   . Hyperlipidemia   . Osteoporosis   . Thyroid disease   . PUD (peptic ulcer disease)   . Lichen sclerosus et atrophicus of the vulva   . Intracranial hemorrhage     Past Surgical History  Procedure Laterality Date  . Cataract extraction w/ intraocular lens  implant, bilateral    . Dilation and curettage of uterus    . Tonsillectomy and adenoidectomy    . Pacemaker insertion      left shoulder  . Subdural hematoma evacuation via craniotomy  2005    after a fall  . Colonoscopy      Family History  Problem Relation Age of Onset  . Colon cancer Mother    Social History:  reports that she has never smoked. She has never used smokeless tobacco. She reports that she does not drink alcohol or use illicit drugs.  Allergies:  Allergies  Allergen Reactions  . Sulfa Antibiotics     Pt. States she may have had a reaction a long time ago    Current outpatient prescriptions:aspirin 325 MG EC tablet, Take 325 mg by mouth every morning. , Disp: , Rfl: ;  bimatoprost (LUMIGAN) 0.03 % ophthalmic solution, Place 1 drop into both eyes at bedtime.  , Disp: , Rfl: ;  brinzolamide (AZOPT) 1 % ophthalmic suspension, Place 1 drop into both eyes 2 (two) times daily.  , Disp: , Rfl:  Calcium Carbonate-Vitamin D (CALTRATE 600+D PO), Take 1 tablet by mouth 2 (two) times daily with breakfast and lunch.  , Disp: , Rfl: ;  Cholecalciferol (VITAMIN D) 2000 UNITS tablet, Take 2,000 Units by mouth 2 (two) times daily.  , Disp: , Rfl: ;  Cyanocobalamin (VITAMIN B 12 PO), Take 2,000 mcg by mouth daily., Disp: , Rfl: ;  Folic Acid 20 MG CAPS, Take 40  mg by mouth 2 (two) times daily., Disp: , Rfl:  levothyroxine (SYNTHROID, LEVOTHROID) 88 MCG tablet, Take 88 mcg by mouth every morning. , Disp: , Rfl: ;  MAGNESIUM-ZINC PO, Take 1 tablet by mouth daily., Disp: , Rfl: ;  Multiple Vitamins-Minerals (MACULAR VITAMIN BENEFIT PO), Take 2 tablets by mouth daily., Disp: , Rfl: ;  nystatin cream (MYCOSTATIN), Apply 1 application topically as needed for dry skin. , Disp: , Rfl:  OMEGA-3 KRILL OIL 300 MG CAPS, Take 1 capsule by mouth daily.  , Disp: , Rfl: ;  omeprazole (PRILOSEC) 20 MG capsule, Take 20 mg by mouth daily.  , Disp: , Rfl: ;  pravastatin (PRAVACHOL) 40 MG tablet, Take 40 mg by mouth every evening. , Disp: , Rfl: ;  alendronate (FOSAMAX) 70 MG tablet, Take 70 mg by mouth every 7 (seven) days. Take with a full glass of water on an empty stomach. Takes on Saturdays, Disp: , Rfl:  Current facility-administered medications:0.9 %  sodium chloride infusion, , Intravenous, Continuous, Brayton El, PA, Last Rate: 20 mL/hr at 09/10/12 0741    Results for orders placed during the hospital encounter of 09/10/12 (from the past 48 hour(s))  APTT     Status: None   Collection Time  09/10/12  7:40 AM      Result Value Range   aPTT 29  24 - 37 seconds  CBC     Status: Abnormal   Collection Time    09/10/12  7:40 AM      Result Value Range   WBC 1.7 (*) 4.0 - 10.5 K/uL   RBC 2.42 (*) 3.87 - 5.11 MIL/uL   Hemoglobin 8.5 (*) 12.0 - 15.0 g/dL   HCT 16.1 (*) 09.6 - 04.5 %   MCV 97.5  78.0 - 100.0 fL   MCH 35.1 (*) 26.0 - 34.0 pg   MCHC 36.0  30.0 - 36.0 g/dL   RDW 40.9  81.1 - 91.4 %   Platelets 356  150 - 400 K/uL  PROTIME-INR     Status: None   Collection Time    09/10/12  7:40 AM      Result Value Range   Prothrombin Time 14.5  11.6 - 15.2 seconds   INR 1.15  0.00 - 1.49   No results found.  Review of Systems  Constitutional: Negative for fever and chills.  Respiratory: Negative for shortness of breath.        Occ cough  Cardiovascular:  Negative for chest pain.  Gastrointestinal: Negative for nausea, vomiting and abdominal pain.  Musculoskeletal: Negative for back pain.  Neurological: Negative for headaches.  Endo/Heme/Allergies: Does not bruise/bleed easily.    Blood pressure 123/55, pulse 84, temperature 97.2 F (36.2 C), temperature source Oral, resp. rate 18, weight 142 lb (64.411 kg), SpO2 100.00%. Physical Exam  Constitutional: She is oriented to person, place, and time. She appears well-developed and well-nourished.  Cardiovascular: Normal rate and regular rhythm.   Respiratory: Effort normal and breath sounds normal.  GI: Soft. Bowel sounds are normal. There is no tenderness.  Musculoskeletal: Normal range of motion. She exhibits no edema.  Neurological: She is alert and oriented to person, place, and time.     Assessment/Plan Pt with hx erythroid leukemia/myelodysplasia. Plan is for CT guided bone marrow biopsy today. Details of above d/w pt with her understanding and consent.  ALLRED,D KEVIN 09/10/2012, 8:03 AM

## 2012-09-15 ENCOUNTER — Ambulatory Visit (HOSPITAL_BASED_OUTPATIENT_CLINIC_OR_DEPARTMENT_OTHER): Payer: Medicare Other | Admitting: Oncology

## 2012-09-15 ENCOUNTER — Telehealth: Payer: Self-pay | Admitting: Oncology

## 2012-09-15 ENCOUNTER — Other Ambulatory Visit (HOSPITAL_BASED_OUTPATIENT_CLINIC_OR_DEPARTMENT_OTHER): Payer: Medicare Other | Admitting: Lab

## 2012-09-15 VITALS — BP 124/73 | HR 86 | Temp 97.7°F | Resp 18 | Ht 64.0 in | Wt 141.1 lb

## 2012-09-15 DIAGNOSIS — C9400 Acute erythroid leukemia, not having achieved remission: Secondary | ICD-10-CM

## 2012-09-15 DIAGNOSIS — D649 Anemia, unspecified: Secondary | ICD-10-CM

## 2012-09-15 DIAGNOSIS — D72819 Decreased white blood cell count, unspecified: Secondary | ICD-10-CM

## 2012-09-15 DIAGNOSIS — D473 Essential (hemorrhagic) thrombocythemia: Secondary | ICD-10-CM

## 2012-09-15 DIAGNOSIS — D709 Neutropenia, unspecified: Secondary | ICD-10-CM

## 2012-09-15 LAB — CBC WITH DIFFERENTIAL/PLATELET
Basophils Absolute: 0 10*3/uL (ref 0.0–0.1)
HCT: 24.6 % — ABNORMAL LOW (ref 34.8–46.6)
HGB: 8.4 g/dL — ABNORMAL LOW (ref 11.6–15.9)
LYMPH%: 56.4 % — ABNORMAL HIGH (ref 14.0–49.7)
MONO#: 0.1 10*3/uL (ref 0.1–0.9)
NEUT%: 37.2 % — ABNORMAL LOW (ref 38.4–76.8)
Platelets: 405 10*3/uL — ABNORMAL HIGH (ref 145–400)
WBC: 1.7 10*3/uL — ABNORMAL LOW (ref 3.9–10.3)
lymph#: 1 10*3/uL (ref 0.9–3.3)

## 2012-09-15 NOTE — Progress Notes (Signed)
ID: [name]   DOB: 10-30-1928  MR#: [id number]  WUJ#:[id number]  HISTORY OF PRESENT ILLNESS: The patient sees Dr. [name] for routine health maintenance. On March 2013 he noted that the patient's hemoglobin was 8.7 (previously it had been 11.1). The MCV was at 107.1. The platelet count was 660,000. The white cell count was 4.2. He obtained a B12 and folate which were within normal limits at 1744 and 8.4 respectively.[name] He checked a ferritin to evaluate the thrombocytosis, and this was 151. Serum protein electrophoresis did not show an M spike and the urine protein electrophoresis was likewise negative. He referred the patient for further evaluation. 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 Today is day 21 cycle 9 of azacytidine. She receives 7 injections with each cycle. Since her last visit here she had a restaging bone marrow biopsy and flow cytometry as discussed below.  REVIEW OF SYSTEMS: She did well with the bone marrow biopsy, although she still has a little bit of soreness at the biopsy site. There has been no fever, bleeding, or swelling. She tolerates the azacytidine without any side effects that she is aware of, except that "my hair is unmanageable map". She is having a little bit of sinus issues with yellow phlegm, but no temperature, no greenish phlegm, no shortness of breath, and no pleurisy. She tolerates her anemia without any chest pain or pressure or shortness of breath. A detailed review of systems is otherwise entirely stable.  PAST MEDICAL HISTORY: Past Medical History  Diagnosis Date  . Pacemaker     left shoulder  . Cataract     had surgery  . GERD (gastroesophageal reflux disease)   . Glaucoma   . Hyperlipidemia   . Osteoporosis   . Thyroid disease   . PUD (peptic ulcer disease)   . Lichen sclerosus et atrophicus of the vulva   . Intracranial hemorrhage     PAST  SURGICAL HISTORY: Past Surgical History  Procedure Laterality Date  . Cataract extraction w/ intraocular lens  implant, bilateral    . Dilation and curettage of uterus    . Tonsillectomy and adenoidectomy    . Pacemaker insertion      left shoulder  . Subdural hematoma evacuation via craniotomy  12/29/03    after a fall  . Colonoscopy      FAMILY HISTORY Family History  Problem Relation Age of Onset  . Colon cancer Mother    the patient's father died at the age of 45 from a myocardial infarction. Her mother was diagnosed with colon cancer at the age of 52 and died from that disease within a year. The patient had 2 sisters. One died at the age of 6 with a neck fracture. The patient has 2 brothers. There is no other history of cancer or hematologic problems in the family to her knowledge  GYNECOLOGIC HISTORY: She does not recall when she had menarche. She is GX P1, first live birth age 67. She went through the change of life in her 30s. She never took hormone replacement.  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 She had top clearance. Her husband died in 29-Dec-1995 from a heart attack. The patient lives alone, with no pets. Her daughter, [name] lives in [place]. She has a PhD in Water quality scientist and works for [employer]. Her phone number is [phone].The patient's brother  [name] lives next door and in case of an emergency she would like [name] to call him first. His phone number is [phone]   ADVANCED DIRECTIVES: in place  HEALTH MAINTENANCE: History  Substance Use Topics  . Smoking status: Never Smoker   . Smokeless tobacco: Never Used  . Alcohol Use: No     Colonoscopy: [facility]/ May 2012  PAP: "no longer"  Bone density: osteoporosis   Lipid panel: [name]             Mammogram: Nov 2012  Allergies  Allergen Reactions  . Sulfa Antibiotics     Pt. States she may have had a reaction a long time ago    Current  Outpatient Prescriptions  Medication Sig Dispense Refill  . alendronate (FOSAMAX) 70 MG tablet Take 70 mg by mouth every 7 (seven) days. Take with a full glass of water on an empty stomach. Takes on Saturdays      . aspirin 325 MG EC tablet Take 325 mg by mouth every morning.       . bimatoprost (LUMIGAN) 0.03 % ophthalmic solution Place 1 drop into both eyes at bedtime.        . brinzolamide (AZOPT) 1 % ophthalmic suspension Place 1 drop into both eyes 2 (two) times daily.        . Calcium Carbonate-Vitamin D (CALTRATE 600+D PO) Take 1 tablet by mouth 2 (two) times daily with breakfast and lunch.        . Cholecalciferol (VITAMIN D) 2000 UNITS tablet Take 2,000 Units by mouth 2 (two) times daily.        . Cyanocobalamin (VITAMIN B 12 PO) Take 2,000 mcg by mouth daily.      . Folic Acid 20 MG CAPS Take 40 mg by mouth 2 (two) times daily.      [name] levothyroxine (SYNTHROID, LEVOTHROID) 88 MCG tablet Take 88 mcg by mouth every morning.       [name] MAGNESIUM-ZINC PO Take 1 tablet by mouth daily.      . Multiple Vitamins-Minerals (MACULAR VITAMIN BENEFIT PO) Take 2 tablets by mouth daily.      [name] nystatin cream (MYCOSTATIN) Apply 1 application topically as needed for dry skin.       [name] OMEGA-3 KRILL OIL 300 MG CAPS Take 1 capsule by mouth daily.        [name] omeprazole (PRILOSEC) 20 MG capsule Take 20 mg by mouth daily.        . pravastatin (PRAVACHOL) 40 MG tablet Take 40 mg by mouth every evening.        No current facility-administered medications for this visit.    OBJECTIVE: Elderly white woman who appears well Filed Vitals:   09/15/12 1024  BP: 124/73  Pulse: 86  Temp: 97.7 F (36.5 C)  Resp: 18     Body mass index is 24.2 kg/(m^2).    ECOG FS: 1  Filed Weights   09/15/12 1024  Weight: 141 lb 1 oz (63.986 kg)   Physical Exam: HEENT:  Sclerae anicteric.  Oropharynx clear.  Nodes:  No cervical or supraclavicular lymphadenopathy  Breast Exam:  Deferred Lungs:  Clear to auscultation  bilaterally.  Heart:  Regular rate and rhythm.  Pacemaker in place Abdomen:  Soft, nontender.  Positive bowel sounds. Musculoskeletal:  No focal spinal tenderness  Extremities: No peripheral edema Neuro:  Nonfocal. Well oriented.Positive affect   LAB RESULTS:   Lab Results  Component Value Date   WBC 1.7* 09/15/2012  NEUTROABS 0.6* 09/15/2012   HGB 8.4* 09/15/2012   HCT 24.6* 09/15/2012   MCV 97.6 09/15/2012   PLT 405* 09/15/2012      Chemistry      Component Value Date/Time   NA 140 06/17/2012 1019   NA 141 02/06/2012 1047      Component Value Date/Time   CALCIUM 9.0 06/17/2012 1019   CALCIUM 9.1 02/06/2012 1047       STUDIES: Patient Name: BRISSA, ASANTE Accession #: [id number] DOB: 08-07-1928 Age: 24 Gender: F Client Name [facility] Collected Date: 09/10/2012 Received Date: 09/10/2012 Physician: Ruthann Cancer, MD Chart #: MRN # : [id number] Physician cc: Race:W Visit #: [id number] FLOW CYTOMETRY REPORT INTERPRETATION Interpretation Bone Marrow Flow Cytometry - 5% MYELOBLASTIC CELLS IDENTIFIED. Guerry Bruin MD Pathologist, Electronic Signature (Case signed 09/12/2012) GROSS AND MICROSCOPIC INFORMATION Source Bone Marrow Flow Cytometry Microscopic Gated population: Flow cytometric immunophenotyping is performed using antibiodies to the antigens listed in the table below. Electronic gates are placed around a cell cluster displaying light scatter properties corresponding to blasts. - Blastic cells in sample: 5% myeloblastic cells present - Phenotype of blastic Cells: HLA-Dr, CD13, CD117, CD34, Glycophorin A 1 of 2 FINAL for Wollin, Aariana J ([id number]) Specimen Table Lymphoid Associated Myeloid Associated Misc. As CD2 neg CD19 neg CD11c neg CD45 pos CD3 neg CD20 neg CD13 pos HLA-DR pos CD4 ND CD21 ND CD14 neg CD10 neg CD5 neg CD22 neg CD15 neg CD56/16 ND CD7 neg CD23 ND CD33 neg ZAP70 ND CD8 ND CD103 ND MPO neg CD34 pos CD25 ND FMC7 ND CD117  pos CD52 ND sKappa ND CD38 ND sLambda ND Glycop pos horin A cKappa ND cLambda ND Gross ref. [id number] All controls stained appropriately. The above tests were developed and their performance characteristics determined by the [facility]. They have not been cleared or approved by the U.S. Food and Drug Administration." Report signed from the following location(s) [facility] [address], [place], [place] [address]. CLIA #: [id number], 2 of   Patient Name: TARAJI, MUNGO Accession #: [id number] DOB: Aug 06, 1928 Age: 36 Gender: F Client Name [facility] Collected Date: 09/10/2012 Received Date: 09/10/2012 Physician: Richarda Overlie Chart #: MRN # : [id number] Physician cc: Ruthann Cancer, MD Race:W Visit #: [id number] BONE MARROW REPORT FINAL DIAGNOSIS Diagnosis Bone Marrow, Aspirate,Biopsy, and Clot, right iliac - HYPERCELLULAR MARROW WITH LEFT SHIFTED ERYTHROID PROLIFERATION ASSOCIATED WITH DYSERYTHROPOIESIS AND SLIGHT INCREASE IN BLASTIC CELLS. - ATYPICAL MEGAKARYOCYTIC PROLIFERATION. - SEE Conner. PERIPHERAL BLOOD: - NORMOCYTIC-NORMOCHROMIC ANEMIA. - LEUKOPENIA WITH CIRCULATING BLASTS. Diagnosis Note As compared to previous bone marrow biopsy ([id number]), there is more pronounced erythroid proliferation in the current material and slight increase in blastic cells as mostly seen by immunohistochemical stains (generally <10%). The findings are consistent with persistence of the leukemic process. Correlation with cytogenetic studies is strongly recommended. (BNS:kh/gt 09-11-12) Guerry Bruin MD Pathologist, Electronic Signature (Case signed 09/12/2012) GROSS AND MICROSCOPIC INFORMATION Specimen Clinical Information History of acute leukemia; needs restaging (kp) Source Bone Marrow, Aspirate,Biopsy, and Clot, right iliac Microscopic LAB DATA: CBC performed on 09/10/2012 shows: WBC 1.7 K/ul Neutrophils 26% 1 of 3 FINAL for Birenbaum, Brixton J  ([id number]) Microscopic(continued) HB 8.5 g/dl Lymphocytes 42% HCT 70.6 % Monocytes 4% MCV 97.5 fL Eosinophils 4% RDW 14.5 % Basophils 0% PLT 356 K/ul Blasts 3% NRBC 4 PERIPHERAL BLOOD SMEAR: The red blood cells display mild anisocytosis with macrocytic and normocytic cells. There is mild poikilocytosis with elliptocytes and tear drop  cells. There is mild polychromasia. The white blood cells are decreased in number. Scattered neutrophils display toxic granulation. Occasional medium to large sized circulating blastic cells with high nuclear cytoplasmic ratio, fine chromatin and small to inconspicuous nucleoli are also present. No Auer rods are seen. The platelets are normal in number. BONE MARROW ASPIRATE: Erythroid precursors: Increased in number with left shifted maturation. Maturing erythroid precursors display nuclear cytoplasmic dyssynchrony, irregular nuclei or lobulated nucleoli. Granulocytic precursors: Decreased in number associated with 5% medium to large blastic cells with high nuclear cytoplasmic ratio, fine chromatin and small to inconspicuous nucleoli. No Auer rods are identified. Maturing cells display normal granulation and lobation Megakaryocytes: Increased in number with many small hypolobated forms or forms with separate nuclear lobes. Lymphocytes/plasma cells: Large aggregates not present. TOUCH PREPARATIONS: A mixture of cell types present with relative abundance of erythroid precursors. CLOT and BIOPSY: The sections show variable cellularity ranging from less than 10% to 60%. There is a mixture of cell types but with prominent increase in immature mononuclear cells throughout the marrow likely correlating with increased number of early erythroid precursors seen in the aspirate. Megakaryocytes are also increased in number with many small hypolobated forms. Immunohistochemical stains for CD117, CD34, E-cadherin and myeloperoxidase were performed on core biopsy. CD117  shows increased weak positivity throughout the marrow in immature mononuclear cells mostly representing early erythroid and/or granulocytic precursors. CD34 shows slight increase in positivity throughout with scattered positive cells and small clusters composed with few cells. Overall positivity is estimated at <10%. Myeloperoxidase highlights the granulocytic component and E-cadherin highlights the erythroid component. IRON STAIN: Iron stains are performed on a bone marrow aspirate smear and section of clot. The controls stained appropriately. Storage Iron: Increased Ringed Sideroblasts: Abundant ADDITIONAL DATA / TESTING: The specimen was sent for cytogenetic analysis and a separate report will follow. Flow cytometric analysis ([id number]) failed to show any significant increase in myeloblastic cells. 2 of 3 FINAL for MEHR, DEPAOLI [phone]) Specimen Table Bone Marrow count performed on 500 cells shows: Blasts: 5% Myeloid 12% Promyelocyts: 0% Myelocytes: 0% Erythroid 78% Metamyelocyts: 2% Bands: 1% Lymphocytes: 9% Neutrophils: 1% Eosinophils: 3% Plasma Cells: 1% Basophils: 0% Monocytes: 0% M:E ratio: 0.15 Gross Received in [facility] is a 1.0 x 1.0 x 1.0 mm aggregate of red brown clotted blood. The specimen is entirely submitted in block A. Received in [facility] are two cores of tan red bone, measuring 0.1 cm in length and 0.2 cm in diameter and 1.1 cm in length x 0.2 cm in diameter. The specimen is entirely submitted in block B following decalcification. (KL:caf 09/10/12) Stain(s) Used in Diagnosis The following stain(s) were used in diagnosing the case: CD 34, CD117 (C-KIT), MPO, E-CAD, Iron Stain. The control(s) stained appropriately. Report signed out from following location(s) [facility] [address], [place], [place] [address]. CLIA #: [id number], 3 of  ASSESSMENT: 79 y.o.  [place] woman with progressive anemia, leukopenia, and thrombocytosis, with a  very elevated MCV, but normal B12, folate, ferritin, and protein electrophoresis, and negative bcr-abl and Jak-2 probes; with a bone marrow biopsy May 2013 suggestive of RARS-M, subsequently read as most consistent with a t(3,3) positive acute leukemia..   (1) started azacytidine 01/14/2012  (2) anemia, s/p transfusion 2 units PRBCs (02/04/2012, 02/28/2012)  (3) neutropenia  (4) thrombocytosis--now normalized  PLAN:  Melea continues to do very well with her treatment and we were hoping to cut back on the number of days of treatment or perhaps increase the treatment  interval. However there has been a slight increase in her blast population. She is seeing Dr. Lowell Guitar tomorrow, and they will discuss these options. Tentatively I have scheduled her to start her next cycle March 3, but of course we can change that as needed. Otherwise she will see me again in 5 weeks.  She continues to have many questions regarding her bills, which I cannot answer. I have suggested that she call Medicare and see if they can be of assistance.  Tawsha Terrero C    09/15/2012

## 2012-09-15 NOTE — Telephone Encounter (Signed)
gv pt appt schedule for March and April.  °

## 2012-09-19 LAB — CHROMOSOME ANALYSIS, BONE MARROW

## 2012-09-22 ENCOUNTER — Ambulatory Visit (HOSPITAL_BASED_OUTPATIENT_CLINIC_OR_DEPARTMENT_OTHER): Payer: Medicare Other

## 2012-09-22 ENCOUNTER — Other Ambulatory Visit (HOSPITAL_BASED_OUTPATIENT_CLINIC_OR_DEPARTMENT_OTHER): Payer: Medicare Other | Admitting: Lab

## 2012-09-22 VITALS — BP 146/69 | HR 77 | Temp 97.6°F | Resp 20

## 2012-09-22 DIAGNOSIS — C9400 Acute erythroid leukemia, not having achieved remission: Secondary | ICD-10-CM

## 2012-09-22 DIAGNOSIS — Z5111 Encounter for antineoplastic chemotherapy: Secondary | ICD-10-CM

## 2012-09-22 DIAGNOSIS — D473 Essential (hemorrhagic) thrombocythemia: Secondary | ICD-10-CM

## 2012-09-22 DIAGNOSIS — D72819 Decreased white blood cell count, unspecified: Secondary | ICD-10-CM

## 2012-09-22 DIAGNOSIS — D649 Anemia, unspecified: Secondary | ICD-10-CM

## 2012-09-22 DIAGNOSIS — I629 Nontraumatic intracranial hemorrhage, unspecified: Secondary | ICD-10-CM

## 2012-09-22 LAB — CBC WITH DIFFERENTIAL/PLATELET
Basophils Absolute: 0 10*3/uL (ref 0.0–0.1)
Eosinophils Absolute: 0 10*3/uL (ref 0.0–0.5)
HCT: 23.2 % — ABNORMAL LOW (ref 34.8–46.6)
HGB: 8.4 g/dL — ABNORMAL LOW (ref 11.6–15.9)
LYMPH%: 68.7 % — ABNORMAL HIGH (ref 14.0–49.7)
MCH: 36.2 pg — ABNORMAL HIGH (ref 25.1–34.0)
MCV: 99.8 fL (ref 79.5–101.0)
MONO%: 4.8 % (ref 0.0–14.0)
NEUT#: 0.4 10*3/uL — CL (ref 1.5–6.5)
NEUT%: 24.4 % — ABNORMAL LOW (ref 38.4–76.8)
Platelets: 517 10*3/uL — ABNORMAL HIGH (ref 145–400)

## 2012-09-22 MED ORDER — AZACITIDINE CHEMO SQ INJECTION
75.0000 mg/m2 | Freq: Once | INTRAMUSCULAR | Status: AC
  Administered 2012-09-22: 130 mg via SUBCUTANEOUS
  Filled 2012-09-22: qty 5.2

## 2012-09-22 MED ORDER — ONDANSETRON HCL 8 MG PO TABS
8.0000 mg | ORAL_TABLET | Freq: Once | ORAL | Status: AC
  Administered 2012-09-22: 8 mg via ORAL

## 2012-09-23 ENCOUNTER — Ambulatory Visit (HOSPITAL_BASED_OUTPATIENT_CLINIC_OR_DEPARTMENT_OTHER): Payer: Medicare Other

## 2012-09-23 VITALS — BP 116/56 | HR 75 | Temp 98.5°F

## 2012-09-23 DIAGNOSIS — Z5111 Encounter for antineoplastic chemotherapy: Secondary | ICD-10-CM

## 2012-09-23 DIAGNOSIS — D649 Anemia, unspecified: Secondary | ICD-10-CM

## 2012-09-23 DIAGNOSIS — C9400 Acute erythroid leukemia, not having achieved remission: Secondary | ICD-10-CM

## 2012-09-23 DIAGNOSIS — I629 Nontraumatic intracranial hemorrhage, unspecified: Secondary | ICD-10-CM

## 2012-09-23 DIAGNOSIS — D72819 Decreased white blood cell count, unspecified: Secondary | ICD-10-CM

## 2012-09-23 DIAGNOSIS — D473 Essential (hemorrhagic) thrombocythemia: Secondary | ICD-10-CM

## 2012-09-23 MED ORDER — AZACITIDINE CHEMO SQ INJECTION
75.0000 mg/m2 | Freq: Once | INTRAMUSCULAR | Status: AC
  Administered 2012-09-23: 130 mg via SUBCUTANEOUS
  Filled 2012-09-23: qty 5.2

## 2012-09-23 MED ORDER — ONDANSETRON HCL 8 MG PO TABS
8.0000 mg | ORAL_TABLET | Freq: Once | ORAL | Status: AC
  Administered 2012-09-23: 8 mg via ORAL

## 2012-09-23 NOTE — Patient Instructions (Signed)
[facility] Discharge Instructions for Patients Receiving Chemotherapy  Today you received the following chemotherapy agents Vidaza  To help prevent nausea and vomiting after your treatment, we encourage you to take your nausea medication as prescribed.    If you develop nausea and vomiting that is not controlled by your nausea medication, call the clinic. If it is after clinic hours your family physician or the after hours number for the clinic or go to the Emergency Department.   BELOW ARE SYMPTOMS THAT SHOULD BE REPORTED IMMEDIATELY:  *FEVER GREATER THAN 100.5 F  *CHILLS WITH OR WITHOUT FEVER  NAUSEA AND VOMITING THAT IS NOT CONTROLLED WITH YOUR NAUSEA MEDICATION  *UNUSUAL SHORTNESS OF BREATH  *UNUSUAL BRUISING OR BLEEDING  TENDERNESS IN MOUTH AND THROAT WITH OR WITHOUT PRESENCE OF ULCERS  *URINARY PROBLEMS  *BOWEL PROBLEMS  UNUSUAL RASH Items with * indicate a potential emergency and should be followed up as soon as possible.  One of the nurses will contact you 24 hours after your treatment. Please let the nurse know about any problems that you may have experienced. Feel free to call the clinic you have any questions or concerns. The clinic phone number is [phone].   I have been informed and understand all the instructions given to me. I know to contact the clinic, my physician, or go to the Emergency Department if any problems should occur. I do not have any questions at this time, but understand that I may call the clinic during office hours   should I have any questions or need assistance in obtaining follow up care.    __________________________________________  _____________  __________ Signature of Patient or Authorized Representative            Date                   Time    __________________________________________ Nurse's Signature    

## 2012-09-23 NOTE — Progress Notes (Signed)
Ok to treat with ANC 0.4 per Vikki Ports.

## 2012-09-24 ENCOUNTER — Ambulatory Visit (HOSPITAL_BASED_OUTPATIENT_CLINIC_OR_DEPARTMENT_OTHER): Payer: Medicare Other

## 2012-09-24 VITALS — BP 117/32 | HR 84 | Temp 98.1°F

## 2012-09-24 DIAGNOSIS — Z5111 Encounter for antineoplastic chemotherapy: Secondary | ICD-10-CM

## 2012-09-24 DIAGNOSIS — D72819 Decreased white blood cell count, unspecified: Secondary | ICD-10-CM

## 2012-09-24 DIAGNOSIS — D473 Essential (hemorrhagic) thrombocythemia: Secondary | ICD-10-CM

## 2012-09-24 DIAGNOSIS — I629 Nontraumatic intracranial hemorrhage, unspecified: Secondary | ICD-10-CM

## 2012-09-24 DIAGNOSIS — C9400 Acute erythroid leukemia, not having achieved remission: Secondary | ICD-10-CM

## 2012-09-24 DIAGNOSIS — D649 Anemia, unspecified: Secondary | ICD-10-CM

## 2012-09-24 MED ORDER — ONDANSETRON HCL 8 MG PO TABS
8.0000 mg | ORAL_TABLET | Freq: Once | ORAL | Status: AC
  Administered 2012-09-24: 8 mg via ORAL

## 2012-09-24 MED ORDER — AZACITIDINE CHEMO SQ INJECTION
75.0000 mg/m2 | Freq: Once | INTRAMUSCULAR | Status: AC
  Administered 2012-09-24: 130 mg via SUBCUTANEOUS
  Filled 2012-09-24: qty 5.2

## 2012-09-24 NOTE — Progress Notes (Signed)
OK to treat with 3/3 ANC 0.4 per Zollie Scale on 3/3.  No new labs.

## 2012-09-25 ENCOUNTER — Ambulatory Visit (HOSPITAL_BASED_OUTPATIENT_CLINIC_OR_DEPARTMENT_OTHER): Payer: Medicare Other

## 2012-09-25 VITALS — BP 126/46 | HR 81 | Temp 97.8°F | Resp 16

## 2012-09-25 DIAGNOSIS — D649 Anemia, unspecified: Secondary | ICD-10-CM

## 2012-09-25 DIAGNOSIS — I629 Nontraumatic intracranial hemorrhage, unspecified: Secondary | ICD-10-CM

## 2012-09-25 DIAGNOSIS — D72819 Decreased white blood cell count, unspecified: Secondary | ICD-10-CM

## 2012-09-25 DIAGNOSIS — D473 Essential (hemorrhagic) thrombocythemia: Secondary | ICD-10-CM

## 2012-09-25 DIAGNOSIS — C9400 Acute erythroid leukemia, not having achieved remission: Secondary | ICD-10-CM

## 2012-09-25 DIAGNOSIS — Z5112 Encounter for antineoplastic immunotherapy: Secondary | ICD-10-CM

## 2012-09-25 MED ORDER — AZACITIDINE CHEMO SQ INJECTION
75.0000 mg/m2 | Freq: Once | INTRAMUSCULAR | Status: AC
  Administered 2012-09-25: 130 mg via SUBCUTANEOUS
  Filled 2012-09-25: qty 5.2

## 2012-09-25 MED ORDER — ONDANSETRON HCL 8 MG PO TABS
8.0000 mg | ORAL_TABLET | Freq: Once | ORAL | Status: AC
  Administered 2012-09-25: 8 mg via ORAL

## 2012-09-26 ENCOUNTER — Ambulatory Visit (HOSPITAL_BASED_OUTPATIENT_CLINIC_OR_DEPARTMENT_OTHER): Payer: Medicare Other

## 2012-09-26 VITALS — BP 120/50 | HR 85 | Temp 97.7°F | Resp 20

## 2012-09-26 DIAGNOSIS — D72819 Decreased white blood cell count, unspecified: Secondary | ICD-10-CM

## 2012-09-26 DIAGNOSIS — D649 Anemia, unspecified: Secondary | ICD-10-CM

## 2012-09-26 DIAGNOSIS — D473 Essential (hemorrhagic) thrombocythemia: Secondary | ICD-10-CM

## 2012-09-26 DIAGNOSIS — I629 Nontraumatic intracranial hemorrhage, unspecified: Secondary | ICD-10-CM

## 2012-09-26 DIAGNOSIS — D75839 Thrombocytosis, unspecified: Secondary | ICD-10-CM

## 2012-09-26 DIAGNOSIS — Z5111 Encounter for antineoplastic chemotherapy: Secondary | ICD-10-CM

## 2012-09-26 DIAGNOSIS — C9400 Acute erythroid leukemia, not having achieved remission: Secondary | ICD-10-CM

## 2012-09-26 MED ORDER — ONDANSETRON HCL 8 MG PO TABS
8.0000 mg | ORAL_TABLET | Freq: Once | ORAL | Status: AC
  Administered 2012-09-26: 8 mg via ORAL

## 2012-09-26 MED ORDER — AZACITIDINE CHEMO SQ INJECTION
75.0000 mg/m2 | Freq: Once | INTRAMUSCULAR | Status: AC
  Administered 2012-09-26: 130 mg via SUBCUTANEOUS
  Filled 2012-09-26: qty 5.2

## 2012-09-29 ENCOUNTER — Other Ambulatory Visit: Payer: Self-pay | Admitting: Physician Assistant

## 2012-09-29 ENCOUNTER — Other Ambulatory Visit (HOSPITAL_BASED_OUTPATIENT_CLINIC_OR_DEPARTMENT_OTHER): Payer: Medicare Other | Admitting: Lab

## 2012-09-29 ENCOUNTER — Ambulatory Visit (HOSPITAL_BASED_OUTPATIENT_CLINIC_OR_DEPARTMENT_OTHER): Payer: Medicare Other

## 2012-09-29 ENCOUNTER — Encounter (HOSPITAL_COMMUNITY)
Admission: RE | Admit: 2012-09-29 | Discharge: 2012-09-29 | Disposition: A | Discharge status: Home / Self Care | Payer: Medicare Other | Source: Ambulatory Visit | Attending: Oncology | Admitting: Oncology

## 2012-09-29 VITALS — BP 146/56 | HR 89 | Temp 97.7°F

## 2012-09-29 DIAGNOSIS — Z5111 Encounter for antineoplastic chemotherapy: Secondary | ICD-10-CM

## 2012-09-29 DIAGNOSIS — D649 Anemia, unspecified: Secondary | ICD-10-CM

## 2012-09-29 DIAGNOSIS — D72819 Decreased white blood cell count, unspecified: Secondary | ICD-10-CM

## 2012-09-29 DIAGNOSIS — D630 Anemia in neoplastic disease: Secondary | ICD-10-CM | POA: Insufficient documentation

## 2012-09-29 DIAGNOSIS — C9400 Acute erythroid leukemia, not having achieved remission: Secondary | ICD-10-CM

## 2012-09-29 DIAGNOSIS — D473 Essential (hemorrhagic) thrombocythemia: Secondary | ICD-10-CM

## 2012-09-29 LAB — CBC WITH DIFFERENTIAL/PLATELET
BASO%: 0 % (ref 0.0–2.0)
Basophils Absolute: 0 10*3/uL (ref 0.0–0.1)
Eosinophils Absolute: 0 10*3/uL (ref 0.0–0.5)
HCT: 20.1 % — ABNORMAL LOW (ref 34.8–46.6)
HGB: 7 g/dL — CL (ref 11.6–15.9)
MONO#: 0.1 10*3/uL (ref 0.1–0.9)
NEUT#: 0.3 10*3/uL — CL (ref 1.5–6.5)
NEUT%: 23.7 % — ABNORMAL LOW (ref 38.4–76.8)
WBC: 1.3 10*3/uL — ABNORMAL LOW (ref 3.9–10.3)
lymph#: 0.9 10*3/uL (ref 0.9–3.3)

## 2012-09-29 MED ORDER — ONDANSETRON HCL 8 MG PO TABS
8.0000 mg | ORAL_TABLET | Freq: Once | ORAL | Status: AC
  Administered 2012-09-29: 8 mg via ORAL

## 2012-09-29 MED ORDER — AZACITIDINE CHEMO SQ INJECTION
75.0000 mg/m2 | Freq: Once | INTRAMUSCULAR | Status: AC
  Administered 2012-09-29: 130 mg via SUBCUTANEOUS
  Filled 2012-09-29: qty 5.2

## 2012-09-29 NOTE — Progress Notes (Signed)
Only complaint is extreme fatigue with some dyspnea on exertion. Reviewed counts with patient and she wants a transfusion asap. Also requests to be seen by Dr. Darnelle Catalan or his midlevel tomorrow. Made MD aware of her request and of counts today. Proceed with Vidaza and schedule for transfusion. Notified lab and HAR requested. Collaborative nurse to arrange for orders.

## 2012-09-30 ENCOUNTER — Other Ambulatory Visit: Payer: Self-pay | Admitting: *Deleted

## 2012-09-30 ENCOUNTER — Inpatient Hospital Stay: Payer: Medicare Other

## 2012-09-30 ENCOUNTER — Ambulatory Visit (HOSPITAL_BASED_OUTPATIENT_CLINIC_OR_DEPARTMENT_OTHER): Payer: Medicare Other

## 2012-09-30 VITALS — BP 115/62 | HR 70 | Temp 97.5°F | Resp 16

## 2012-09-30 DIAGNOSIS — Z5111 Encounter for antineoplastic chemotherapy: Secondary | ICD-10-CM

## 2012-09-30 DIAGNOSIS — I629 Nontraumatic intracranial hemorrhage, unspecified: Secondary | ICD-10-CM

## 2012-09-30 DIAGNOSIS — D649 Anemia, unspecified: Secondary | ICD-10-CM

## 2012-09-30 DIAGNOSIS — D630 Anemia in neoplastic disease: Secondary | ICD-10-CM

## 2012-09-30 DIAGNOSIS — C9400 Acute erythroid leukemia, not having achieved remission: Secondary | ICD-10-CM

## 2012-09-30 DIAGNOSIS — D72819 Decreased white blood cell count, unspecified: Secondary | ICD-10-CM

## 2012-09-30 DIAGNOSIS — D473 Essential (hemorrhagic) thrombocythemia: Secondary | ICD-10-CM

## 2012-09-30 LAB — PREPARE RBC (CROSSMATCH)

## 2012-09-30 MED ORDER — DIPHENHYDRAMINE HCL 25 MG PO CAPS
25.0000 mg | ORAL_CAPSULE | Freq: Once | ORAL | Status: AC
Start: 2012-09-30 — End: 2012-09-30
  Administered 2012-09-30: 25 mg via ORAL

## 2012-09-30 MED ORDER — AZACITIDINE CHEMO SQ INJECTION
75.0000 mg/m2 | Freq: Once | INTRAMUSCULAR | Status: AC
  Administered 2012-09-30: 130 mg via SUBCUTANEOUS
  Filled 2012-09-30: qty 5.2

## 2012-09-30 MED ORDER — ONDANSETRON HCL 8 MG PO TABS
8.0000 mg | ORAL_TABLET | Freq: Once | ORAL | Status: AC
  Administered 2012-09-30: 8 mg via ORAL

## 2012-09-30 MED ORDER — ACETAMINOPHEN 325 MG PO TABS
650.0000 mg | ORAL_TABLET | Freq: Once | ORAL | Status: AC
  Administered 2012-09-30: 650 mg via ORAL

## 2012-09-30 NOTE — Patient Instructions (Addendum)
Blood Transfusion   A blood transfusion replaces your blood or some of its parts. Blood is replaced when you have lost blood because of surgery, an accident, or for severe blood conditions like anemia.  You can donate blood to be used on yourself if you have a planned surgery. If you lose blood during that surgery, your own blood can be given back to you.  Any blood given to you is checked to make sure it matches your blood type. Your temperature, blood pressure, and heart rate (vital signs) will be checked often.   GET HELP RIGHT AWAY IF:    You feel sick to your stomach (nauseous) or throw up (vomit).   You have watery poop (diarrhea).   You have shortness of breath or trouble breathing.   You have blood in your pee (urine) or have dark colored pee.   You have chest pain or tightness.   Your eyes or skin turn yellow (jaundice).   You have a temperature by mouth above 102 F (38.9 C), not controlled by medicine.   You start to shake and have chills.   You develop a a red rash (hives) or feel itchy.   You develop lightheadedness or feel confused.   You develop back, joint, or muscle pain.   You do not feel hungry (lost appetite).   You feel tired, restless, or nervous.   You develop belly (abdominal) cramps.  Document Released: 10/05/2008 Document Revised: 10/01/2011 Document Reviewed: 10/05/2008  [employer] Patient Information 2013 [employer].

## 2012-09-30 NOTE — Progress Notes (Signed)
Per MD pt does not require irradiated blood products.  This RN called Juliette Alcide at blood bank and informed her of above.

## 2012-10-01 ENCOUNTER — Other Ambulatory Visit: Payer: Self-pay | Admitting: Oncology

## 2012-10-01 ENCOUNTER — Other Ambulatory Visit: Payer: Self-pay | Admitting: Certified Registered Nurse Anesthetist

## 2012-10-01 DIAGNOSIS — C9200 Acute myeloblastic leukemia, not having achieved remission: Secondary | ICD-10-CM

## 2012-10-01 LAB — TYPE AND SCREEN
Donor AG Type: NEGATIVE
Unit division: 0

## 2012-10-01 NOTE — Progress Notes (Signed)
I talked to [place] today. Her last bone marrow on February 19 did show a slight increase in blasts, and her most recent lab work showed significant anemia. She required transfusion yesterday. Today she feels much better. Nevertheless have asked her to stop then on fiber 26th just to make sure her labs continue well. She knows to call for any problems that may develop before then (she is going to be in [place] between the 20th and 23rd of February).

## 2012-10-07 ENCOUNTER — Telehealth: Payer: Self-pay | Admitting: *Deleted

## 2012-10-07 NOTE — Telephone Encounter (Signed)
sw pt appt d/t was given ...Pt is aware. 

## 2012-10-15 ENCOUNTER — Other Ambulatory Visit (HOSPITAL_BASED_OUTPATIENT_CLINIC_OR_DEPARTMENT_OTHER): Payer: Medicare Other | Admitting: Lab

## 2012-10-15 DIAGNOSIS — C9400 Acute erythroid leukemia, not having achieved remission: Secondary | ICD-10-CM

## 2012-10-15 DIAGNOSIS — C9200 Acute myeloblastic leukemia, not having achieved remission: Secondary | ICD-10-CM

## 2012-10-15 LAB — CBC & DIFF AND RETIC
Basophils Absolute: 0 10*3/uL (ref 0.0–0.1)
Eosinophils Absolute: 0 10*3/uL (ref 0.0–0.5)
HCT: 25.4 % — ABNORMAL LOW (ref 34.8–46.6)
HGB: 8.5 g/dL — ABNORMAL LOW (ref 11.6–15.9)
Immature Retic Fract: 10.5 % — ABNORMAL HIGH (ref 1.60–10.00)
MCH: 31.6 pg (ref 25.1–34.0)
MONO#: 0.1 10*3/uL (ref 0.1–0.9)
NEUT#: 0.2 10*3/uL — CL (ref 1.5–6.5)
NEUT%: 16 % — ABNORMAL LOW (ref 38.4–76.8)
RDW: 15.1 % — ABNORMAL HIGH (ref 11.2–14.5)
Retic %: 0.63 % — ABNORMAL LOW (ref 0.70–2.10)
Retic Ct Abs: 16.95 10*3/uL — ABNORMAL LOW (ref 33.70–90.70)
WBC: 1.2 10*3/uL — ABNORMAL LOW (ref 3.9–10.3)
lymph#: 0.9 10*3/uL (ref 0.9–3.3)

## 2012-10-19 ENCOUNTER — Other Ambulatory Visit: Payer: Self-pay | Admitting: Oncology

## 2012-10-20 ENCOUNTER — Other Ambulatory Visit (HOSPITAL_BASED_OUTPATIENT_CLINIC_OR_DEPARTMENT_OTHER): Payer: Medicare Other | Admitting: Lab

## 2012-10-20 ENCOUNTER — Ambulatory Visit: Payer: Medicare Other

## 2012-10-20 ENCOUNTER — Telehealth: Payer: Self-pay | Admitting: *Deleted

## 2012-10-20 ENCOUNTER — Ambulatory Visit (HOSPITAL_BASED_OUTPATIENT_CLINIC_OR_DEPARTMENT_OTHER): Payer: Medicare Other | Admitting: Oncology

## 2012-10-20 VITALS — BP 125/74 | HR 91 | Temp 97.7°F | Resp 20 | Ht 64.0 in | Wt 142.4 lb

## 2012-10-20 DIAGNOSIS — D709 Neutropenia, unspecified: Secondary | ICD-10-CM

## 2012-10-20 DIAGNOSIS — C9400 Acute erythroid leukemia, not having achieved remission: Secondary | ICD-10-CM

## 2012-10-20 DIAGNOSIS — I629 Nontraumatic intracranial hemorrhage, unspecified: Secondary | ICD-10-CM

## 2012-10-20 DIAGNOSIS — D649 Anemia, unspecified: Secondary | ICD-10-CM

## 2012-10-20 DIAGNOSIS — Z5111 Encounter for antineoplastic chemotherapy: Secondary | ICD-10-CM

## 2012-10-20 DIAGNOSIS — D473 Essential (hemorrhagic) thrombocythemia: Secondary | ICD-10-CM

## 2012-10-20 DIAGNOSIS — D72819 Decreased white blood cell count, unspecified: Secondary | ICD-10-CM

## 2012-10-20 DIAGNOSIS — C9200 Acute myeloblastic leukemia, not having achieved remission: Secondary | ICD-10-CM

## 2012-10-20 LAB — CBC WITH DIFFERENTIAL/PLATELET
Basophils Absolute: 0 10*3/uL (ref 0.0–0.1)
EOS%: 0.6 % (ref 0.0–7.0)
Eosinophils Absolute: 0 10*3/uL (ref 0.0–0.5)
HGB: 8 g/dL — ABNORMAL LOW (ref 11.6–15.9)
MCV: 95.9 fL (ref 79.5–101.0)
MONO%: 4.8 % (ref 0.0–14.0)
NEUT#: 0.2 10*3/uL — CL (ref 1.5–6.5)
RBC: 2.4 10*6/uL — ABNORMAL LOW (ref 3.70–5.45)
RDW: 16.8 % — ABNORMAL HIGH (ref 11.2–14.5)
lymph#: 0.9 10*3/uL (ref 0.9–3.3)

## 2012-10-20 MED ORDER — AZACITIDINE CHEMO SQ INJECTION
75.0000 mg/m2 | Freq: Once | INTRAMUSCULAR | Status: AC
  Administered 2012-10-20: 130 mg via SUBCUTANEOUS
  Filled 2012-10-20: qty 5.2

## 2012-10-20 MED ORDER — ONDANSETRON HCL 8 MG PO TABS
8.0000 mg | ORAL_TABLET | Freq: Once | ORAL | Status: AC
  Administered 2012-10-20: 8 mg via ORAL

## 2012-10-20 NOTE — Patient Instructions (Signed)
[facility] Discharge Instructions for Patients Receiving Chemotherapy  Today you received the following chemotherapy agents Vidaza  To help prevent nausea and vomiting after your treatment, we encourage you to take your nausea medication. If you develop nausea and vomiting that is not controlled by your nausea medication, call the clinic. If it is after clinic hours your family physician or the after hours number for the clinic or go to the Emergency Department.   BELOW ARE SYMPTOMS THAT SHOULD BE REPORTED IMMEDIATELY:  *FEVER GREATER THAN 100.5 F  *CHILLS WITH OR WITHOUT FEVER  NAUSEA AND VOMITING THAT IS NOT CONTROLLED WITH YOUR NAUSEA MEDICATION  *UNUSUAL SHORTNESS OF BREATH  *UNUSUAL BRUISING OR BLEEDING  TENDERNESS IN MOUTH AND THROAT WITH OR WITHOUT PRESENCE OF ULCERS  *URINARY PROBLEMS  *BOWEL PROBLEMS  UNUSUAL RASH Items with * indicate a potential emergency and should be followed up as soon as possible.  One of the nurses will contact you 24 hours after your treatment. Please let the nurse know about any problems that you may have experienced. Feel free to call the clinic you have any questions or concerns. The clinic phone number is [phone].   I have been informed and understand all the instructions given to me. I know to contact the clinic, my physician, or go to the Emergency Department if any problems should occur. I do not have any questions at this time, but understand that I may call the clinic during office hours   should I have any questions or need assistance in obtaining follow up care.    __________________________________________  _____________  __________ Signature of Patient or Authorized Representative            Date                   Time    __________________________________________ Nurse's Signature    

## 2012-10-20 NOTE — Telephone Encounter (Signed)
Lm gv appt d/t..made pt aware that i will alos mail a letter/cal as well.

## 2012-10-20 NOTE — Progress Notes (Signed)
ID: [name]   DOB: 1928-08-01  MR#: [id number]  ION#:[id number]  HISTORY OF PRESENT ILLNESS: The patient sees Dr. Pearson Conner for routine health maintenance. On March 2013 he noted that the patient's hemoglobin was 8.7 (previously it had been 11.1). The MCV was at 107.1. The platelet count was 660,000. The white cell count was 4.2. He obtained a B12 and folate which were within normal limits at 1744 and 8.4 respectively.[name] He checked a ferritin to evaluate the thrombocytosis, and this was 151. Serum protein electrophoresis did not show an M spike and the urine protein electrophoresis was likewise negative. He referred the patient for further evaluation. 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 Today is day 1 cycle 10 of azacytidine. In the interval since her last visit here she ran into Dr. Lowell Conner at a concert (where he was also giving a lecture). She also continues to follow her bills very closely, and brought me 3c she received from her insurance company requesting further information for services. I am of course passing those on our billing counselors.  REVIEW OF SYSTEMS: She continues to tolerate treatment remarkably well, and has had no intercurrent fevers or bleeding. She does not describe herself as fatigue. She does "all my normal activities". A detailed review of systems today noncontributory   PAST MEDICAL HISTORY: Past Medical History  Diagnosis Date  . Pacemaker     left shoulder  . Cataract     had surgery  . GERD (gastroesophageal reflux disease)   . Glaucoma   . Hyperlipidemia   . Osteoporosis   . Thyroid disease   . PUD (peptic ulcer disease)   . Lichen sclerosus et atrophicus of the vulva   . Intracranial hemorrhage     PAST SURGICAL HISTORY: Past Surgical History  Procedure Laterality Date  . Cataract extraction w/ intraocular lens  implant, bilateral    . Dilation and  curettage of uterus    . Tonsillectomy and adenoidectomy    . Pacemaker insertion      left shoulder  . Subdural hematoma evacuation via craniotomy  Dec 08, 2003    after a fall  . Colonoscopy      FAMILY HISTORY Family History  Problem Relation Age of Onset  . Colon cancer Mother    the patient's father died at the age of 73 from a myocardial infarction. Her mother was diagnosed with colon cancer at the age of 51 and died from that disease within a year. The patient had 2 sisters. One died at the age of 104 with a neck fracture. The patient has 2 brothers. There is no other history of cancer or hematologic problems in the family to her knowledge  GYNECOLOGIC HISTORY: She does not recall when she had menarche. She is GX P1, first live birth age 8. She went through the change of life in her 79s. She never took hormone replacement.  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 Her husband died in Dec 08, 1995 from a heart attack. The patient lives alone, with no pets. Her daughter, [name] lives in [place]. She has a PhD in Water quality scientist and works for [employer]. Her phone number is [phone].The patient's brother [name] lives next door and in case of an emergency she would like [name] to call him first. His phone number is [phone]   ADVANCED DIRECTIVES: in  place  HEALTH MAINTENANCE: History  Substance Use Topics  . Smoking status: Never Smoker   . Smokeless tobacco: Never Used  . Alcohol Use: No     Colonoscopy: [facility]/ May 2012  PAP: "no longer"  Bone density: osteoporosis   Lipid panel: [name]             Mammogram: Nov 2012  Allergies  Allergen Reactions  . Sulfa Antibiotics     Pt. States she may have had a reaction a long time ago    Current Outpatient Prescriptions  Medication Sig Dispense Refill  . alendronate (FOSAMAX) 70 MG tablet Take 70 mg by mouth every 7 (seven) days. Take with a full glass  of water on an empty stomach. Takes on Saturdays      . aspirin 325 MG EC tablet Take 325 mg by mouth every morning.       . bimatoprost (LUMIGAN) 0.03 % ophthalmic solution Place 1 drop into both eyes at bedtime.        . brinzolamide (AZOPT) 1 % ophthalmic suspension Place 1 drop into both eyes 2 (two) times daily.        . Calcium Carbonate-Vitamin D (CALTRATE 600+D PO) Take 1 tablet by mouth 2 (two) times daily with breakfast and lunch.        . Cholecalciferol (VITAMIN D) 2000 UNITS tablet Take 2,000 Units by mouth 2 (two) times daily.        . Cyanocobalamin (VITAMIN B 12 PO) Take 2,000 mcg by mouth daily.      . Folic Acid 20 MG CAPS Take 40 mg by mouth 2 (two) times daily.      [name] levothyroxine (SYNTHROID, LEVOTHROID) 88 MCG tablet Take 88 mcg by mouth every morning.       [name] MAGNESIUM-ZINC PO Take 1 tablet by mouth daily.      . Multiple Vitamins-Minerals (MACULAR VITAMIN BENEFIT PO) Take 2 tablets by mouth daily.      [name] nystatin cream (MYCOSTATIN) Apply 1 application topically as needed for dry skin.       [name] OMEGA-3 KRILL OIL 300 MG CAPS Take 1 capsule by mouth daily.        [name] omeprazole (PRILOSEC) 20 MG capsule Take 20 mg by mouth daily.        . pravastatin (PRAVACHOL) 40 MG tablet Take 40 mg by mouth every evening.        No current facility-administered medications for this visit.   Facility-Administered Medications Ordered in Other Visits  Medication Dose Route Frequency Provider Last Rate Last Dose  . azaCITIDine (VIDAZA) chemo injection 130 mg  75 mg/m2 (Treatment Plan Actual) Subcutaneous Once Lowella Dell, MD      . ondansetron [facility]) tablet 8 mg  8 mg Oral Once Lowella Dell, MD        OBJECTIVE: Elderly white woman who appears well Filed Vitals:   10/20/12 1137  BP: 125/74  Pulse: 91  Temp: 97.7 F (36.5 C)  Resp: 20     Body mass index is 24.43 kg/(m^2).    ECOG FS: 1  Filed Weights   10/20/12 1137  Weight: 142 lb 6.4 oz (64.592 kg)   Physical  Exam: HEENT:  Sclerae anicteric.  Oropharynx clear.  Nodes:  No cervical or supraclavicular lymphadenopathy  Breast Exam:  Deferred Lungs:  Clear to auscultation bilaterally.  Heart:  Regular rate and rhythm.  Pacemaker in place Abdomen:  Soft, nontender.  Positive bowel sounds. Musculoskeletal:  No focal spinal tenderness  Extremities: No peripheral edema Neuro:  Nonfocal. Well oriented. Positive affect   LAB RESULTS:   Lab Results  Component Value Date   WBC 1.2* 10/20/2012   NEUTROABS 0.2* 10/20/2012   HGB 8.0* 10/20/2012   HCT 23.0* 10/20/2012   MCV 95.9 10/20/2012   PLT 346 10/20/2012      Chemistry      Component Value Date/Time   NA 140 06/17/2012 1019   NA 141 02/06/2012 1047      Component Value Date/Time   CALCIUM 9.0 06/17/2012 1019   CALCIUM 9.1 02/06/2012 1047       STUDIES: No results found.  ASSESSMENT: 79 y.o.  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   (1) started azacytidine 01/14/2012  (2) anemia, s/p transfusion 2 units PRBCs (02/04/2012, 02/28/2012)  (3) neutropenia  (4) thrombocytosis--now normalized  PLAN:  Astha continues to do very well with her treatment but she remains severely neutropenic and has required some transfusions. We are going to cut her treatments down from 7 days to 5, and were going to repeat a bone marrow biopsy on April 21. If the problem is that we are depleting her marrow, we will start broadening the time between treatments as well as cutting down the treatment from 7-5 days as we are doing this time. If the problem is one of leukemia progressing through treatment, we will have to consider alternative therapies.  MAGRINAT,GUSTAV C     10/20/2012

## 2012-10-21 ENCOUNTER — Other Ambulatory Visit: Payer: Self-pay | Admitting: Certified Registered Nurse Anesthetist

## 2012-10-21 ENCOUNTER — Ambulatory Visit (HOSPITAL_BASED_OUTPATIENT_CLINIC_OR_DEPARTMENT_OTHER): Payer: Medicare Other

## 2012-10-21 VITALS — BP 124/48 | HR 79 | Temp 97.6°F

## 2012-10-21 DIAGNOSIS — Z5111 Encounter for antineoplastic chemotherapy: Secondary | ICD-10-CM

## 2012-10-21 DIAGNOSIS — I629 Nontraumatic intracranial hemorrhage, unspecified: Secondary | ICD-10-CM

## 2012-10-21 DIAGNOSIS — D473 Essential (hemorrhagic) thrombocythemia: Secondary | ICD-10-CM

## 2012-10-21 DIAGNOSIS — D649 Anemia, unspecified: Secondary | ICD-10-CM

## 2012-10-21 DIAGNOSIS — C9400 Acute erythroid leukemia, not having achieved remission: Secondary | ICD-10-CM

## 2012-10-21 DIAGNOSIS — D72819 Decreased white blood cell count, unspecified: Secondary | ICD-10-CM

## 2012-10-21 MED ORDER — AZACITIDINE CHEMO SQ INJECTION
75.0000 mg/m2 | Freq: Once | INTRAMUSCULAR | Status: AC
  Administered 2012-10-21: 130 mg via SUBCUTANEOUS
  Filled 2012-10-21: qty 5.2

## 2012-10-21 MED ORDER — ONDANSETRON HCL 8 MG PO TABS
8.0000 mg | ORAL_TABLET | Freq: Once | ORAL | Status: AC
  Administered 2012-10-21: 8 mg via ORAL

## 2012-10-22 ENCOUNTER — Ambulatory Visit (HOSPITAL_BASED_OUTPATIENT_CLINIC_OR_DEPARTMENT_OTHER): Payer: Medicare Other

## 2012-10-22 VITALS — BP 137/44 | HR 95 | Temp 98.6°F

## 2012-10-22 DIAGNOSIS — D72819 Decreased white blood cell count, unspecified: Secondary | ICD-10-CM

## 2012-10-22 DIAGNOSIS — Z5111 Encounter for antineoplastic chemotherapy: Secondary | ICD-10-CM

## 2012-10-22 DIAGNOSIS — I629 Nontraumatic intracranial hemorrhage, unspecified: Secondary | ICD-10-CM

## 2012-10-22 DIAGNOSIS — D473 Essential (hemorrhagic) thrombocythemia: Secondary | ICD-10-CM

## 2012-10-22 DIAGNOSIS — C9400 Acute erythroid leukemia, not having achieved remission: Secondary | ICD-10-CM

## 2012-10-22 DIAGNOSIS — D649 Anemia, unspecified: Secondary | ICD-10-CM

## 2012-10-22 MED ORDER — AZACITIDINE CHEMO SQ INJECTION
75.0000 mg/m2 | Freq: Once | INTRAMUSCULAR | Status: AC
Start: 2012-10-22 — End: 2012-10-22
  Administered 2012-10-22: 130 mg via SUBCUTANEOUS
  Filled 2012-10-22: qty 5.2

## 2012-10-22 MED ORDER — ONDANSETRON HCL 8 MG PO TABS
8.0000 mg | ORAL_TABLET | Freq: Once | ORAL | Status: AC
  Administered 2012-10-22: 8 mg via ORAL

## 2012-10-22 NOTE — Patient Instructions (Addendum)
[facility] Discharge Instructions for Patients Receiving Chemotherapy  Today you received the following chemotherapy agents Vidaza  To help prevent nausea and vomiting after your treatment, we encourage you to take your nausea medication as prescribed.   If you develop nausea and vomiting that is not controlled by your nausea medication, call the clinic. If it is after clinic hours your family physician or the after hours number for the clinic or go to the Emergency Department.   BELOW ARE SYMPTOMS THAT SHOULD BE REPORTED IMMEDIATELY:  *FEVER GREATER THAN 100.5 F  *CHILLS WITH OR WITHOUT FEVER  NAUSEA AND VOMITING THAT IS NOT CONTROLLED WITH YOUR NAUSEA MEDICATION  *UNUSUAL SHORTNESS OF BREATH  *UNUSUAL BRUISING OR BLEEDING  TENDERNESS IN MOUTH AND THROAT WITH OR WITHOUT PRESENCE OF ULCERS  *URINARY PROBLEMS  *BOWEL PROBLEMS  UNUSUAL RASH Items with * indicate a potential emergency and should be followed up as soon as possible.  Feel free to call the clinic you have any questions or concerns. The clinic phone number is [phone].   I have been informed and understand all the instructions given to me. I know to contact the clinic, my physician, or go to the Emergency Department if any problems should occur. I do not have any questions at this time, but understand that I may call the clinic during office hours   should I have any questions or need assistance in obtaining follow up care.    __________________________________________  _____________  __________ Signature of Patient or Authorized Representative            Date                   Time    __________________________________________ Nurse's Signature    

## 2012-10-23 ENCOUNTER — Other Ambulatory Visit: Payer: Self-pay | Admitting: Certified Registered Nurse Anesthetist

## 2012-10-23 ENCOUNTER — Ambulatory Visit (HOSPITAL_BASED_OUTPATIENT_CLINIC_OR_DEPARTMENT_OTHER): Payer: Medicare Other

## 2012-10-23 VITALS — BP 130/63 | HR 93 | Temp 97.2°F

## 2012-10-23 DIAGNOSIS — D649 Anemia, unspecified: Secondary | ICD-10-CM

## 2012-10-23 DIAGNOSIS — C9400 Acute erythroid leukemia, not having achieved remission: Secondary | ICD-10-CM

## 2012-10-23 DIAGNOSIS — D473 Essential (hemorrhagic) thrombocythemia: Secondary | ICD-10-CM

## 2012-10-23 DIAGNOSIS — I629 Nontraumatic intracranial hemorrhage, unspecified: Secondary | ICD-10-CM

## 2012-10-23 DIAGNOSIS — D75839 Thrombocytosis, unspecified: Secondary | ICD-10-CM

## 2012-10-23 DIAGNOSIS — D72819 Decreased white blood cell count, unspecified: Secondary | ICD-10-CM

## 2012-10-23 DIAGNOSIS — Z5111 Encounter for antineoplastic chemotherapy: Secondary | ICD-10-CM

## 2012-10-23 MED ORDER — AZACITIDINE CHEMO SQ INJECTION
75.0000 mg/m2 | Freq: Once | INTRAMUSCULAR | Status: AC
  Administered 2012-10-23: 130 mg via SUBCUTANEOUS
  Filled 2012-10-23: qty 5.2

## 2012-10-23 MED ORDER — ONDANSETRON HCL 8 MG PO TABS
8.0000 mg | ORAL_TABLET | Freq: Once | ORAL | Status: AC
  Administered 2012-10-23: 8 mg via ORAL

## 2012-10-23 NOTE — Patient Instructions (Signed)
Call MD for problems or concerns 

## 2012-10-24 ENCOUNTER — Other Ambulatory Visit: Payer: Self-pay | Admitting: Emergency Medicine

## 2012-10-24 ENCOUNTER — Ambulatory Visit (HOSPITAL_BASED_OUTPATIENT_CLINIC_OR_DEPARTMENT_OTHER): Payer: Medicare Other

## 2012-10-24 ENCOUNTER — Encounter: Payer: Self-pay | Admitting: Oncology

## 2012-10-24 VITALS — BP 132/72 | HR 79 | Temp 96.9°F | Resp 20

## 2012-10-24 DIAGNOSIS — C9400 Acute erythroid leukemia, not having achieved remission: Secondary | ICD-10-CM

## 2012-10-24 DIAGNOSIS — I629 Nontraumatic intracranial hemorrhage, unspecified: Secondary | ICD-10-CM

## 2012-10-24 DIAGNOSIS — D72819 Decreased white blood cell count, unspecified: Secondary | ICD-10-CM

## 2012-10-24 DIAGNOSIS — Z5111 Encounter for antineoplastic chemotherapy: Secondary | ICD-10-CM

## 2012-10-24 DIAGNOSIS — D649 Anemia, unspecified: Secondary | ICD-10-CM

## 2012-10-24 DIAGNOSIS — D473 Essential (hemorrhagic) thrombocythemia: Secondary | ICD-10-CM

## 2012-10-24 MED ORDER — AZACITIDINE CHEMO SQ INJECTION
75.0000 mg/m2 | Freq: Once | INTRAMUSCULAR | Status: AC
  Administered 2012-10-24: 130 mg via SUBCUTANEOUS
  Filled 2012-10-24: qty 5.2

## 2012-10-24 MED ORDER — ONDANSETRON HCL 8 MG PO TABS
8.0000 mg | ORAL_TABLET | Freq: Once | ORAL | Status: AC
  Administered 2012-10-24: 8 mg via ORAL

## 2012-10-24 NOTE — Patient Instructions (Signed)
[facility] Discharge Instructions for Patients Receiving Chemotherapy  Today you received the following chemotherapy agents Vidaza    If you develop nausea and vomiting that is not controlled by your nausea medication, call the clinic. If it is after clinic hours your family physician or the after hours number for the clinic or go to the Emergency Department.   BELOW ARE SYMPTOMS THAT SHOULD BE REPORTED IMMEDIATELY:  *FEVER GREATER THAN 100.5 F  *CHILLS WITH OR WITHOUT FEVER  NAUSEA AND VOMITING THAT IS NOT CONTROLLED WITH YOUR NAUSEA MEDICATION  *UNUSUAL SHORTNESS OF BREATH  *UNUSUAL BRUISING OR BLEEDING  TENDERNESS IN MOUTH AND THROAT WITH OR WITHOUT PRESENCE OF ULCERS  *URINARY PROBLEMS  *BOWEL PROBLEMS  UNUSUAL RASH Items with * indicate a potential emergency and should be followed up as soon as possible.  One of the nurses will contact you 24 hours after your treatment. Please let the nurse know about any problems that you may have experienced. Feel free to call the clinic you have any questions or concerns. The clinic phone number is [phone].   I have been informed and understand all the instructions given to me. I know to contact the clinic, my physician, or go to the Emergency Department if any problems should occur. I do not have any questions at this time, but understand that I may call the clinic during office hours   should I have any questions or need assistance in obtaining follow up care.    __________________________________________  _____________  __________ Signature of Patient or Authorized Representative            Date                   Time    __________________________________________ Nurse's Signature

## 2012-10-27 ENCOUNTER — Inpatient Hospital Stay: Payer: Medicare Other

## 2012-10-28 ENCOUNTER — Inpatient Hospital Stay: Payer: Medicare Other

## 2012-10-28 ENCOUNTER — Other Ambulatory Visit: Payer: Self-pay | Admitting: *Deleted

## 2012-10-28 DIAGNOSIS — C9400 Acute erythroid leukemia, not having achieved remission: Secondary | ICD-10-CM

## 2012-10-28 NOTE — Progress Notes (Signed)
Message left by pt requesting a return call due to " I think my hemoglobin may be low ".  This RN returned call and obtained VM. Message left for pt to call back but this RN is placing a request for a lab to be done 4/9 for assessment.

## 2012-10-29 ENCOUNTER — Other Ambulatory Visit: Payer: Self-pay | Admitting: *Deleted

## 2012-10-29 ENCOUNTER — Encounter (HOSPITAL_COMMUNITY)
Admission: RE | Admit: 2012-10-29 | Discharge: 2012-10-29 | Disposition: A | Discharge status: Home / Self Care | Payer: Medicare Other | Source: Ambulatory Visit | Attending: Oncology | Admitting: Oncology

## 2012-10-29 DIAGNOSIS — C9400 Acute erythroid leukemia, not having achieved remission: Secondary | ICD-10-CM

## 2012-10-29 DIAGNOSIS — D649 Anemia, unspecified: Secondary | ICD-10-CM

## 2012-10-29 DIAGNOSIS — D630 Anemia in neoplastic disease: Secondary | ICD-10-CM

## 2012-10-30 ENCOUNTER — Other Ambulatory Visit (HOSPITAL_BASED_OUTPATIENT_CLINIC_OR_DEPARTMENT_OTHER): Payer: Medicare Other | Admitting: Lab

## 2012-10-30 ENCOUNTER — Other Ambulatory Visit: Payer: Self-pay | Admitting: Physician Assistant

## 2012-10-30 ENCOUNTER — Encounter: Payer: Self-pay | Admitting: Oncology

## 2012-10-30 ENCOUNTER — Ambulatory Visit: Payer: Medicare Other

## 2012-10-30 VITALS — BP 122/71 | HR 77 | Temp 97.3°F | Resp 18

## 2012-10-30 DIAGNOSIS — D630 Anemia in neoplastic disease: Secondary | ICD-10-CM

## 2012-10-30 DIAGNOSIS — D649 Anemia, unspecified: Secondary | ICD-10-CM

## 2012-10-30 LAB — CBC WITH DIFFERENTIAL/PLATELET
BASO%: 0.6 % (ref 0.0–2.0)
Basophils Absolute: 0 10*3/uL (ref 0.0–0.1)
Eosinophils Absolute: 0 10*3/uL (ref 0.0–0.5)
HCT: 20.5 % — ABNORMAL LOW (ref 34.8–46.6)
HGB: 7.1 g/dL — ABNORMAL LOW (ref 11.6–15.9)
LYMPH%: 74.9 % — ABNORMAL HIGH (ref 14.0–49.7)
MONO#: 0.1 10*3/uL (ref 0.1–0.9)
NEUT#: 0.3 10*3/uL — CL (ref 1.5–6.5)
NEUT%: 19.1 % — ABNORMAL LOW (ref 38.4–76.8)
Platelets: 550 10*3/uL — ABNORMAL HIGH (ref 145–400)
WBC: 1.4 10*3/uL — ABNORMAL LOW (ref 3.9–10.3)
lymph#: 1.1 10*3/uL (ref 0.9–3.3)

## 2012-10-30 MED ORDER — SODIUM CHLORIDE 0.9 % IV SOLN
250.0000 mL | Freq: Once | INTRAVENOUS | Status: AC
  Administered 2012-10-30: 250 mL via INTRAVENOUS

## 2012-10-30 MED ORDER — DIPHENHYDRAMINE HCL 25 MG PO CAPS
25.0000 mg | ORAL_CAPSULE | Freq: Once | ORAL | Status: AC
  Administered 2012-10-30: 25 mg via ORAL

## 2012-10-30 MED ORDER — ACETAMINOPHEN 325 MG PO TABS
650.0000 mg | ORAL_TABLET | Freq: Once | ORAL | Status: AC
  Administered 2012-10-30: 650 mg via ORAL

## 2012-10-30 NOTE — Progress Notes (Signed)
Patient in today for blood transfusion, however,bloodbank does not have blood for this patient available today due to antibodies. Patient request to have IV site capped remain in place for tomorrow. Spoke with Dr Darnelle Catalan, ok to leave peripheral IV in for use tomorrow. Patient explained to keep site clean and dry. Patient verbalized understanding. New appts being made for tomorrow. Patient was given premeds today, states she feels no effects from Benadryl. Warned on hazards of driving due to possible sedation.

## 2012-10-31 ENCOUNTER — Ambulatory Visit (HOSPITAL_BASED_OUTPATIENT_CLINIC_OR_DEPARTMENT_OTHER): Payer: Medicare Other

## 2012-10-31 VITALS — BP 123/55 | HR 71 | Temp 97.7°F | Resp 18

## 2012-10-31 DIAGNOSIS — D649 Anemia, unspecified: Secondary | ICD-10-CM

## 2012-10-31 DIAGNOSIS — D630 Anemia in neoplastic disease: Secondary | ICD-10-CM

## 2012-10-31 MED ORDER — SODIUM CHLORIDE 0.9 % IV SOLN
250.0000 mL | Freq: Once | INTRAVENOUS | Status: AC
  Administered 2012-10-31: 250 mL via INTRAVENOUS

## 2012-10-31 MED ORDER — DIPHENHYDRAMINE HCL 25 MG PO CAPS
25.0000 mg | ORAL_CAPSULE | Freq: Once | ORAL | Status: AC
  Administered 2012-10-31: 25 mg via ORAL

## 2012-10-31 MED ORDER — ACETAMINOPHEN 325 MG PO TABS
650.0000 mg | ORAL_TABLET | Freq: Once | ORAL | Status: AC
  Administered 2012-10-31: 650 mg via ORAL

## 2012-10-31 NOTE — Patient Instructions (Addendum)
Blood Transfusion Information WHAT IS A BLOOD TRANSFUSION? A transfusion is the replacement of blood or some of its parts. Blood is made up of multiple cells which provide different functions.  Red blood cells carry oxygen and are used for blood loss replacement.  White blood cells fight against infection.  Platelets control bleeding.  Plasma helps clot blood.  Other blood products are available for specialized needs, such as hemophilia or other clotting disorders. BEFORE THE TRANSFUSION  Who gives blood for transfusions?   You may be able to donate blood to be used at a later date on yourself (autologous donation).  Relatives can be asked to donate blood. This is generally not any safer than if you have received blood from a stranger. The same precautions are taken to ensure safety when a relative's blood is donated.  Healthy volunteers who are fully evaluated to make sure their blood is safe. This is blood bank blood. Transfusion therapy is the safest it has ever been in the practice of medicine. Before blood is taken from a donor, a complete history is taken to make sure that person has no history of diseases nor engages in risky social behavior (examples are intravenous drug use or sexual activity with multiple partners). The donor's travel history is screened to minimize risk of transmitting infections, such as malaria. The donated blood is tested for signs of infectious diseases, such as HIV and hepatitis. The blood is then tested to be sure it is compatible with you in order to minimize the chance of a transfusion reaction. If you or a relative donates blood, this is often done in anticipation of surgery and is not appropriate for emergency situations. It takes many days to process the donated blood. RISKS AND COMPLICATIONS Although transfusion therapy is very safe and saves many lives, the main dangers of transfusion include:   Getting an infectious disease.  Developing a  transfusion reaction. This is an allergic reaction to something in the blood you were given. Every precaution is taken to prevent this. The decision to have a blood transfusion has been considered carefully by your caregiver before blood is given. Blood is not given unless the benefits outweigh the risks. AFTER THE TRANSFUSION  Right after receiving a blood transfusion, you will usually feel much better and more energetic. This is especially true if your red blood cells have gotten low (anemic). The transfusion raises the level of the red blood cells which carry oxygen, and this usually causes an energy increase.  The nurse administering the transfusion will monitor you carefully for complications. HOME CARE INSTRUCTIONS  No special instructions are needed after a transfusion. You may find your energy is better. Speak with your caregiver about any limitations on activity for underlying diseases you may have. SEEK MEDICAL CARE IF:   Your condition is not improving after your transfusion.  You develop redness or irritation at the intravenous (IV) site. SEEK IMMEDIATE MEDICAL CARE IF:  Any of the following symptoms occur over the next 12 hours:  Shaking chills.  You have a temperature by mouth above 102 F (38.9 C), not controlled by medicine.  Chest, back, or muscle pain.  People around you feel you are not acting correctly or are confused.  Shortness of breath or difficulty breathing.  Dizziness and fainting.  You get a rash or develop hives.  You have a decrease in urine output.  Your urine turns a dark color or changes to pink, red, or brown. Any of the following   symptoms occur over the next 10 days:  You have a temperature by mouth above 102 F (38.9 C), not controlled by medicine.  Shortness of breath.  Weakness after normal activity.  The white part of the eye turns yellow (jaundice).  You have a decrease in the amount of urine or are urinating less often.  Your  urine turns a dark color or changes to pink, red, or brown. Document Released: 07/06/2000 Document Revised: 10/01/2011 Document Reviewed: 02/23/2008 Exit[employer] Patient Information 2013 [employer].  

## 2012-11-01 LAB — TYPE AND SCREEN
ABO/RH(D): A POS
Antibody Screen: NEGATIVE
Donor AG Type: NEGATIVE
Unit division: 0
Unit division: 0

## 2012-11-04 ENCOUNTER — Encounter (HOSPITAL_COMMUNITY): Payer: Self-pay | Admitting: Pharmacy Technician

## 2012-11-06 ENCOUNTER — Other Ambulatory Visit: Payer: Self-pay | Admitting: Radiology

## 2012-11-10 ENCOUNTER — Ambulatory Visit (HOSPITAL_COMMUNITY)
Admission: RE | Admit: 2012-11-10 | Discharge: 2012-11-10 | Disposition: A | Discharge status: Home / Self Care | Payer: Medicare Other | Source: Ambulatory Visit | Attending: Oncology | Admitting: Oncology

## 2012-11-10 ENCOUNTER — Encounter (HOSPITAL_COMMUNITY): Payer: Self-pay

## 2012-11-10 DIAGNOSIS — C9200 Acute myeloblastic leukemia, not having achieved remission: Secondary | ICD-10-CM | POA: Insufficient documentation

## 2012-11-10 DIAGNOSIS — E785 Hyperlipidemia, unspecified: Secondary | ICD-10-CM | POA: Insufficient documentation

## 2012-11-10 DIAGNOSIS — K219 Gastro-esophageal reflux disease without esophagitis: Secondary | ICD-10-CM | POA: Insufficient documentation

## 2012-11-10 DIAGNOSIS — Z950 Presence of cardiac pacemaker: Secondary | ICD-10-CM | POA: Insufficient documentation

## 2012-11-10 DIAGNOSIS — Z79899 Other long term (current) drug therapy: Secondary | ICD-10-CM | POA: Insufficient documentation

## 2012-11-10 DIAGNOSIS — D72819 Decreased white blood cell count, unspecified: Secondary | ICD-10-CM | POA: Insufficient documentation

## 2012-11-10 DIAGNOSIS — D649 Anemia, unspecified: Secondary | ICD-10-CM | POA: Insufficient documentation

## 2012-11-10 HISTORY — DX: Leukemia, unspecified not having achieved remission: C95.90

## 2012-11-10 LAB — BONE MARROW EXAM: Bone Marrow Exam: 257

## 2012-11-10 LAB — CBC
MCV: 92.3 fL (ref 78.0–100.0)
Platelets: 381 10*3/uL (ref 150–400)
RDW: 14.8 % (ref 11.5–15.5)
WBC: 1.2 10*3/uL — CL (ref 4.0–10.5)

## 2012-11-10 MED ORDER — FENTANYL CITRATE 0.05 MG/ML IJ SOLN
INTRAMUSCULAR | Status: DC
Start: 2012-11-10 — End: 2012-11-11
  Filled 2012-11-10: qty 4

## 2012-11-10 MED ORDER — MIDAZOLAM HCL 2 MG/2ML IJ SOLN
INTRAMUSCULAR | Status: AC | PRN
  Administered 2012-11-10: 1 mg via INTRAVENOUS

## 2012-11-10 MED ORDER — MIDAZOLAM HCL 2 MG/2ML IJ SOLN
INTRAMUSCULAR | Status: AC
  Filled 2012-11-10: qty 4

## 2012-11-10 MED ORDER — FENTANYL CITRATE 0.05 MG/ML IJ SOLN
INTRAMUSCULAR | Status: AC | PRN
  Administered 2012-11-10: 100 ug via INTRAVENOUS

## 2012-11-10 MED ORDER — SODIUM CHLORIDE 0.9 % IV SOLN
Freq: Once | INTRAVENOUS | Status: AC
  Administered 2012-11-10: 07:00:00 via INTRAVENOUS

## 2012-11-10 NOTE — H&P (Signed)
[name] is an 79 y.o. female.   Chief Complaint: "I'm having another bone marrow biopsy" HPI: Patient with history of erythroid leukemia/myelodysplasia presents today for CT guided bone marrow biopsy to assess response to therapy.  Past Medical History  Diagnosis Date  . Pacemaker     left shoulder  . Cataract     had surgery  . GERD (gastroesophageal reflux disease)   . Glaucoma   . Hyperlipidemia   . Osteoporosis   . Thyroid disease   . PUD (peptic ulcer disease)   . Lichen sclerosus et atrophicus of the vulva   . Intracranial hemorrhage   . Leukemia     Past Surgical History  Procedure Laterality Date  . Cataract extraction w/ intraocular lens  implant, bilateral    . Dilation and curettage of uterus    . Tonsillectomy and adenoidectomy    . Pacemaker insertion      left shoulder  . Subdural hematoma evacuation via craniotomy  2005    after a fall  . Colonoscopy      Family History  Problem Relation Age of Onset  . Colon cancer Mother    Social History:  reports that she has never smoked. She has never used smokeless tobacco. She reports that she does not drink alcohol or use illicit drugs.  Allergies:  Allergies  Allergen Reactions  . Sulfa Antibiotics     Pt. States she may have had a reaction a long time ago    Current outpatient prescriptions:aspirin 325 MG EC tablet, Take 325 mg by mouth every morning. , Disp: , Rfl: ;  bimatoprost (LUMIGAN) 0.03 % ophthalmic solution, Place 1 drop into both eyes at bedtime.  , Disp: , Rfl: ;  brinzolamide (AZOPT) 1 % ophthalmic suspension, Place 1 drop into both eyes 2 (two) times daily.  , Disp: , Rfl:  Calcium Carbonate-Vitamin D (CALTRATE 600+D PO), Take 1 tablet by mouth 2 (two) times daily with breakfast and lunch.  , Disp: , Rfl: ;  Cholecalciferol (VITAMIN D) 2000 UNITS tablet, Take 2,000 Units by mouth 2 (two) times daily.  , Disp: , Rfl: ;  Cyanocobalamin (VITAMIN B 12 PO), Take 2,000 mcg by mouth daily., Disp:  , Rfl: ;  docusate sodium (COLACE) 100 MG capsule, Take 100 mg by mouth daily., Disp: , Rfl:  Folic Acid 20 MG CAPS, Take 40 mg by mouth 2 (two) times daily., Disp: , Rfl: ;  levothyroxine (SYNTHROID, LEVOTHROID) 88 MCG tablet, Take 88 mcg by mouth every morning. , Disp: , Rfl: ;  MAGNESIUM-ZINC PO, Take 1 tablet by mouth daily., Disp: , Rfl: ;  Multiple Vitamins-Minerals (MACULAR VITAMIN BENEFIT PO), Take 2 tablets by mouth daily., Disp: , Rfl:  nystatin cream (MYCOSTATIN), Apply 1 application topically as needed for dry skin. , Disp: , Rfl: ;  OMEGA-3 KRILL OIL 300 MG CAPS, Take 1 capsule by mouth daily.  , Disp: , Rfl: ;  omeprazole (PRILOSEC) 20 MG capsule, Take 20 mg by mouth daily.  , Disp: , Rfl: ;  pravastatin (PRAVACHOL) 40 MG tablet, Take 40 mg by mouth every evening. , Disp: , Rfl:  alendronate (FOSAMAX) 70 MG tablet, Take 70 mg by mouth every Saturday. Take with a full glass of water on an empty stomach. Takes on Saturdays, Disp: , Rfl:    Results for orders placed during the hospital encounter of 11/10/12 (from the past 48 hour(s))  APTT     Status: None   Collection Time  11/10/12  7:20 AM      Result Value Range   aPTT 30  24 - 37 seconds  CBC     Status: Abnormal   Collection Time    11/10/12  7:20 AM      Result Value Range   WBC 1.2 (*) 4.0 - 10.5 K/uL   Comment: REPEATED TO VERIFY     CRITICAL RESULT CALLED TO, READ BACK BY AND VERIFIED WITH:     J. CHILTON RN AT 0730 ON 04.21.14 BY SHUEA   RBC 2.59 (*) 3.87 - 5.11 MIL/uL   Hemoglobin 8.3 (*) 12.0 - 15.0 g/dL   HCT 16.1 (*) 09.6 - 04.5 %   MCV 92.3  78.0 - 100.0 fL   MCH 32.0  26.0 - 34.0 pg   MCHC 34.7  30.0 - 36.0 g/dL   RDW 40.9  81.1 - 91.4 %   Platelets 381  150 - 400 K/uL  PROTIME-INR     Status: None   Collection Time    11/10/12  7:20 AM      Result Value Range   Prothrombin Time 14.6  11.6 - 15.2 seconds   INR 1.16  0.00 - 1.49   No results found.  Review of Systems  Constitutional: Negative for  fever and chills.  Respiratory: Negative for cough and shortness of breath.   Cardiovascular: Negative for chest pain.  Gastrointestinal: Negative for nausea, vomiting and abdominal pain.  Musculoskeletal: Negative for back pain.  Neurological: Negative for headaches.  Endo/Heme/Allergies: Does not bruise/bleed easily.    Blood pressure 127/54, pulse 77, temperature 98 F (36.7 C), temperature source Oral, resp. rate 20, height 5\' 4"  (1.626 m), weight 142 lb (64.411 kg), SpO2 100.00%. Physical Exam  Constitutional: She is oriented to person, place, and time. She appears well-developed and well-nourished.  Cardiovascular: Normal rate and regular rhythm.   Pacemaker present   Respiratory: Effort normal and breath sounds normal.  GI: Soft. Bowel sounds are normal. There is no tenderness.  Musculoskeletal: Normal range of motion.  Neurological: She is alert and oriented to person, place, and time.     Assessment/Plan Pt with hx of erythroid leukemia/myelodysplasia. Plan is for CT guided bone marrow biopsy today to assess response to therapy. Details/risks of above d/w pt with her understanding and consent.  Lathyn Griggs,D KEVIN 11/10/2012, 7:58 AM

## 2012-11-10 NOTE — Progress Notes (Addendum)
Critical CRITICAL VALUE ALERT  Critical value received: WBC 1.2 Date of notification:  11/10/12 Time of notification: 0735 Critical value read back:yes  Nurse who received alert: Lamont Snowball RN MD notified (1st page):   Time of first page: 0745 to Jeananne Rama PA radiology  MD notified (2nd page): Jeananne Rama notified of this result and no orders  Time of second page:  Responding MD:   Time MD responded:

## 2012-11-10 NOTE — Procedures (Signed)
L iliac BM Bx No comp

## 2012-11-17 ENCOUNTER — Ambulatory Visit (HOSPITAL_BASED_OUTPATIENT_CLINIC_OR_DEPARTMENT_OTHER): Payer: Medicare Other | Admitting: Oncology

## 2012-11-17 ENCOUNTER — Telehealth: Payer: Self-pay | Admitting: *Deleted

## 2012-11-17 ENCOUNTER — Other Ambulatory Visit: Payer: Self-pay | Admitting: *Deleted

## 2012-11-17 ENCOUNTER — Other Ambulatory Visit (HOSPITAL_BASED_OUTPATIENT_CLINIC_OR_DEPARTMENT_OTHER): Payer: Medicare Other | Admitting: Lab

## 2012-11-17 VITALS — BP 113/63 | HR 85 | Temp 98.0°F | Resp 20 | Ht 64.0 in | Wt 141.9 lb

## 2012-11-17 DIAGNOSIS — D709 Neutropenia, unspecified: Secondary | ICD-10-CM

## 2012-11-17 DIAGNOSIS — D649 Anemia, unspecified: Secondary | ICD-10-CM

## 2012-11-17 DIAGNOSIS — C9400 Acute erythroid leukemia, not having achieved remission: Secondary | ICD-10-CM

## 2012-11-17 LAB — HOLD TUBE, BLOOD BANK

## 2012-11-17 LAB — CBC WITH DIFFERENTIAL/PLATELET
BASO%: 1.6 % (ref 0.0–2.0)
EOS%: 0.5 % (ref 0.0–7.0)
HCT: 20.7 % — ABNORMAL LOW (ref 34.8–46.6)
MCH: 31.4 pg (ref 25.1–34.0)
MCHC: 34 g/dL (ref 31.5–36.0)
NEUT%: 20.5 % — ABNORMAL LOW (ref 38.4–76.8)
RBC: 2.23 10*6/uL — ABNORMAL LOW (ref 3.70–5.45)
RDW: 16.1 % — ABNORMAL HIGH (ref 11.2–14.5)
lymph#: 1 10*3/uL (ref 0.9–3.3)

## 2012-11-17 MED ORDER — LEVOFLOXACIN 500 MG PO TABS: 500.0000 mg | ORAL_TABLET | Freq: Every day | ORAL | Status: DC

## 2012-11-17 MED ORDER — FLUCONAZOLE 100 MG PO TABS: 100.0000 mg | ORAL_TABLET | Freq: Every day | ORAL | Status: DC

## 2012-11-17 NOTE — Telephone Encounter (Signed)
appts made and printed...td 

## 2012-11-17 NOTE — Progress Notes (Signed)
ID: [name]   DOB: 11-26-1928  MR#: [id number]  AOZ#:[id number]  HISTORY OF PRESENT ILLNESS: The patient sees Dr. [name] for routine health maintenance. On March 2013 he noted that the patient's hemoglobin was 8.7 (previously it had been 11.1). The MCV was at 107.1. The platelet count was 660,000. The white cell count was 4.2. He obtained a B12 and folate which were within normal limits at 1744 and 8.4 respectively.[name] He checked a ferritin to evaluate the thrombocytosis, and this was 151. Serum protein electrophoresis did not show an M spike and the urine protein electrophoresis was likewise negative. He referred the patient for further evaluation. 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 Today would be day 1 cycle 11 of azacytidine. However I am holding her treatment at this point so I can discuss with Dr. Lowell Guitar what the best next step may be.  REVIEW OF SYSTEMS: She reports no symptoms related to her treatment. She has a normal functional status for her age. She does have helped with the house keeper, but does some of the housework herself. She has a little bit of soreness from the recent bone marrow biopsy, but no bleeding. She has had no fever or rash. Her weight is stable. Overall a detailed review of systems today was noncontributory.  PAST MEDICAL HISTORY: Past Medical History  Diagnosis Date  . Pacemaker     left shoulder  . Cataract     had surgery  . GERD (gastroesophageal reflux disease)   . Glaucoma   . Hyperlipidemia   . Osteoporosis   . Thyroid disease   . PUD (peptic ulcer disease)   . Lichen sclerosus et atrophicus of the vulva   . Intracranial hemorrhage   . Leukemia     PAST SURGICAL HISTORY: Past Surgical History  Procedure Laterality Date  . Cataract extraction w/ intraocular lens  implant, bilateral    . Dilation and curettage of uterus    . Tonsillectomy and  adenoidectomy    . Pacemaker insertion      left shoulder  . Subdural hematoma evacuation via craniotomy  12-31-03    after a fall  . Colonoscopy      FAMILY HISTORY Family History  Problem Relation Age of Onset  . Colon cancer Mother    the patient's father died at the age of 77 from a myocardial infarction. Her mother was diagnosed with colon cancer at the age of 20 and died from that disease within a year. The patient had 2 sisters. One died at the age of 70 with a neck fracture. The patient has 2 brothers. There is no other history of cancer or hematologic problems in the family to her knowledge  GYNECOLOGIC HISTORY: She does not recall when she had menarche. She is GX P1, first live birth age 32. She went through the change of life in her 61s. She never took hormone replacement.  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 Her husband died in 1995/12/31 from a heart attack. The patient lives alone, with no pets. Her daughter, [name] lives in [place]. She has a PhD in Water quality scientist and works for [employer]. Her phone number is [phone].The patient's brother [name] lives next door and in case of an emergency she would like [name] to call him first. His phone number is [phone]  ADVANCED DIRECTIVES: in place  HEALTH MAINTENANCE: History  Substance Use Topics  . Smoking status: Never Smoker   . Smokeless tobacco: Never Used  . Alcohol Use: No     Colonoscopy: [facility]/ May 2012  PAP: "no longer"  Bone density: osteoporosis   Lipid panel: [name]             Mammogram: Nov 2012  Allergies  Allergen Reactions  . Sulfa Antibiotics     Pt. States she may have had a reaction a long time ago    Current Outpatient Prescriptions  Medication Sig Dispense Refill  . alendronate (FOSAMAX) 70 MG tablet Take 70 mg by mouth every Saturday. Take with a full glass of water on an empty stomach. Takes on Saturdays       . aspirin 325 MG EC tablet Take 325 mg by mouth every morning.       . bimatoprost (LUMIGAN) 0.03 % ophthalmic solution Place 1 drop into both eyes at bedtime.        . brinzolamide (AZOPT) 1 % ophthalmic suspension Place 1 drop into both eyes 2 (two) times daily.        . Calcium Carbonate-Vitamin D (CALTRATE 600+D PO) Take 1 tablet by mouth 2 (two) times daily with breakfast and lunch.        . Cholecalciferol (VITAMIN D) 2000 UNITS tablet Take 2,000 Units by mouth 2 (two) times daily.        . Cyanocobalamin (VITAMIN B 12 PO) Take 2,000 mcg by mouth daily.      [name] docusate sodium (COLACE) 100 MG capsule Take 100 mg by mouth daily.      . fluconazole (DIFLUCAN) 100 MG tablet Take 1 tablet (100 mg total) by mouth daily.  30 tablet  12  . Folic Acid 20 MG CAPS Take 40 mg by mouth 2 (two) times daily.      [name] levofloxacin (LEVAQUIN) 500 MG tablet Take 1 tablet (500 mg total) by mouth daily.  30 tablet  12  . levothyroxine (SYNTHROID, LEVOTHROID) 88 MCG tablet Take 88 mcg by mouth every morning.       [name] MAGNESIUM-ZINC PO Take 1 tablet by mouth daily.      . Multiple Vitamins-Minerals (MACULAR VITAMIN BENEFIT PO) Take 2 tablets by mouth daily.      [name] nystatin cream (MYCOSTATIN) Apply 1 application topically as needed for dry skin.       [name] OMEGA-3 KRILL OIL 300 MG CAPS Take 1 capsule by mouth daily.        [name] omeprazole (PRILOSEC) 20 MG capsule Take 20 mg by mouth daily.        . pravastatin (PRAVACHOL) 40 MG tablet Take 40 mg by mouth every evening.        No current facility-administered medications for this visit.    OBJECTIVE: Elderly white woman who appears well Filed Vitals:   11/17/12 1138  BP: 113/63  Pulse: 85  Temp: 98 F (36.7 C)  Resp: 20     Body mass index is 24.34 kg/(m^2).    ECOG FS: 1  Filed Weights   11/17/12 1138  Weight: 141 lb 14.4 oz (64.365 kg)   Physical Exam: HEENT:  Sclerae anicteric.  Oropharynx clear.  Nodes:  No cervical or supraclavicular  lymphadenopathy  Breast Exam:  Deferred Lungs:  Clear to auscultation bilaterally.  Heart:  Regular rate and rhythm.  Pacemaker in place Abdomen:  Soft, nontender.  Positive bowel sounds. Musculoskeletal:  No focal spinal tenderness  Extremities: No peripheral edema Neuro:  Nonfocal. Well oriented. Positive affect   LAB RESULTS:   % blasts (date)     By flow On marrow  12/11/2011  22%  5-15%  03/19/2012   --  5%  06/12/2012   --  <5%  09/10/2012  5%  5%  11/03/2012  8%  6%   Results for [name], [name] (MRN [id number]) as of 11/17/2012 11:29  Ref. Range 05/12/2012 10:04 06/02/2012 14:49 06/17/2012 10:19 07/08/2012 15:56 07/28/2012 14:59 08/25/2012 14:45 09/01/2012 15:20 09/15/2012 10:12 09/22/2012 15:31 09/29/2012 14:53 10/15/2012 09:00 10/20/2012 11:26 10/30/2012 12:12 11/17/2012 11:25  NEUT# Latest Range: 1.5-6.5 10e3/uL 0.8 (L) 1.1 (L) 1.4 (L) 1.1 (L) 1.0 (L) 0.7 (L) 0.6 (L) 0.6 (L) 0.4 (LL) 0.3 (LL) 0.2 (LL) 0.2 (LL) 0.3 (LL) 0.3 (LL)   Results for [name], [name] (MRN [id number]) as of 11/17/2012 17:19  Ref. Range 10/15/2012 09:00 10/20/2012 11:26 10/30/2012 12:12 11/10/2012 07:20 11/17/2012 11:25  Hemoglobin Latest Range: 12.0-15.0 g/dL 8.5 (L) 8.0 (L) 7.1 (L) 8.3 (L) 7.0 (LL)    Lab Results  Component Value Date   WBC 1.3* 11/17/2012   NEUTROABS 0.3* 11/17/2012   HGB 7.0* 11/17/2012   HCT 20.7* 11/17/2012   MCV 92.6 11/17/2012   PLT 406* 11/17/2012      Chemistry      Component Value Date/Time   NA 140 06/17/2012 1019   NA 141 02/06/2012 1047      Component Value Date/Time   CALCIUM 9.0 06/17/2012 1019   CALCIUM 9.1 02/06/2012 1047       STUDIES: Patient Name: [name], [name] Accession #: [id number] DOB: 05-05-1929 Age: 79 Gender: F Client Name [facility] Collected Date: 11/10/2012 Received Date: 11/10/2012 Physician: Art Hoss Chart #: MRN # : [id number] Physician cc: Ruthann Cancer, MD Race:W Visit #: [id number].[place] [facility]0 BONE MARROW REPORT FINAL  DIAGNOSIS Diagnosis Bone Marrow, Aspirate and Biopsy - HYPERCELLULAR BONE MARROW FOR AGE WITH PERSISTENT LEUKEMIA. - SEE Conner. PERIPHERAL BLOOD: - NORMOCYTIC-NORMOCHROMIC ANEMIA. - LEUKOPENIA WITH CIRCULATING BLASTS. Diagnosis Note The bone marrow shows striking left shifted erythroid proliferation associated with atypical megakaryocytic proliferation and slight increased in myeloblastic cells as see by morphology and flow cytometric studies (generally less than 10%). The findings are similar to previous bone marrow ([id number]) and consistent with the persistence of the leukemia process. Correlation with cytogenetic studies is recommended. (BNS:gt, 12/03/2012) Guerry Bruin MD Pathologist, Electronic Signature (Case signed 12/03/12) GROSS AND MICROSCOPIC INFORMATION Specimen Clinical Information re-evaluate response to leukemia therapy (kp) Source Bone Marrow, Aspirate and Biopsy Microscopic LAB DATA: CBC performed on 11/10/2012 shows: WBC 1.2 K/ul Neutrophils 17% HB 8.3 g/dl Lymphocytes 72% 1 of 3 FINAL for [name], [name] ([id number]) Microscopic(continued) HCT 23.9 % Monocytes 2% MCV 92.3 fL Eosinophils 3% RDW 14.8 % Basophils 0% PLT 381 K/ul Blasts 6% PERIPHERAL BLOOD SMEAR: The red blood cells display mild anisocytosis with macrocytic and normocytic cells. There is mild poikilocytosis with elliptocytes and teardrop cells. The white blood cells are decreased in number with scattered medium to large blastic cells with high nuclear cytoplasmic ratio, fine chromatin and small to inconspicuous nucleoli. Auer rods are not identified. Scattered myelocytes are also seen on scan. The maturing neutrophilic cells are decreased in number with toxic granulation. The platelets are abundant. BONE MARROW ASPIRATE: Erythroid precursors: Prominently increased in number with left shifted maturation. Maturing cells display nuclear cytoplasmic dyssynchrony. Granulocytic precursors: Markedly  decreased in number. The blastic cells are estimated at  6% of all cells and consist of medium to large sized cells with high nuclear cytoplasmic ratio, fine chromatin and small to inconspicuous nucleoli. No Auer rods are identified. Markedly decreased maturing neutrophilic cells display hypogranularity. Megakaryocytes: Increased in number with mostly small hypolobated/unilobated forms or small forms with separate nuclear lobes. Lymphocytes/plasma cells: Large aggregates not present. TOUCH PREPARATIONS: Predominance of erythroid precursors. CLOT and BIOPSY: The sections show 50 to 70% with prominent increase in immature mononuclear cells throughout the bone marrow likely correlating with the abundant erythroid component seen in the aspirate. Megakaryocytes are also abundant with many small abnormal forms. Maturing neutrophilic cells are decreased in number. IRON STAIN: Iron stains are performed on a bone marrow aspirate smear and section of clot. The controls stained appropriately. Storage Iron: Increased. Ringed Sideroblasts: Abundant. ADDITIONAL DATA / TESTING: Specimen was sent for cytogenetic analysis and a separate report will follow. Flow cytometric analysis shows a slight increase in myeloblastic cells ([id number]). Specimen Table Bone Marrow count performed on 500 cells shows: Blasts: 6% Myeloid 9% Promyelocyts: 0% Myelocytes: 1% Erythroid 71% Metamyelocyts: 0% Bands: 1% Lymphocytes: 18% Neutrophils: 0% Eosinophils: 1% Plasma Cells: 2% Basophils: 0% Monocytes: 0% M:E ratio: 0.1 2 of 3 FINAL for [name]  ASSESSMENT: 79 y.o.  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   (1) started azacytidine 01/14/2012  (2) anemia, s/p transfusion 8 units PRBCs (02/04/2012, 02/28/2012, 10/01/2012 and 11/07/2012)  (3) neutropenia--progressive  (4) thrombocytosis--now normalized  PLAN:  Alanie is tolerating her treatment well, but she is requiring more blood transfusions. Her neutrophils also are now persistently below 500. We discussed the need to start prophylactic antibiotics at this point. Given her borderline creatinine clearance, due chiefly to her age, we are dosing the Levaquin at 500 mg and the Diflucan at 100 mg. I am also setting her up for repeat transfusion 11/19/2012.  She had questions regarding bills, and for unclear reasons her insurance is refusing to pay for certain lab tests. We will appeal that.  I am going to be consulting with Dr. Lowell Guitar regarding the best way to proceed at this point. She had an appointment at [facility] May 20th and he will let us know if he would prefer she keep that or simply change treatments and reevaluate after 2-3 months.   Kamerin Grumbine C    11/17/2012

## 2012-11-18 LAB — CHROMOSOME ANALYSIS, BONE MARROW

## 2012-11-19 ENCOUNTER — Ambulatory Visit (HOSPITAL_BASED_OUTPATIENT_CLINIC_OR_DEPARTMENT_OTHER): Payer: Medicare Other

## 2012-11-19 ENCOUNTER — Other Ambulatory Visit: Payer: Self-pay | Admitting: Emergency Medicine

## 2012-11-19 ENCOUNTER — Other Ambulatory Visit: Payer: Self-pay | Admitting: Oncology

## 2012-11-19 VITALS — BP 130/79 | HR 73 | Temp 97.7°F | Resp 18

## 2012-11-19 DIAGNOSIS — C9400 Acute erythroid leukemia, not having achieved remission: Secondary | ICD-10-CM

## 2012-11-19 DIAGNOSIS — D649 Anemia, unspecified: Secondary | ICD-10-CM

## 2012-11-19 MED ORDER — SODIUM CHLORIDE 0.9 % IJ SOLN
10.0000 mL | INTRAMUSCULAR | Status: DC | PRN
  Filled 2012-11-19: qty 10

## 2012-11-19 MED ORDER — DIPHENHYDRAMINE HCL 25 MG PO CAPS
25.0000 mg | ORAL_CAPSULE | Freq: Once | ORAL | Status: AC
  Administered 2012-11-19: 25 mg via ORAL

## 2012-11-19 MED ORDER — ACETAMINOPHEN 325 MG PO TABS
650.0000 mg | ORAL_TABLET | Freq: Once | ORAL | Status: AC
Start: 2012-11-19 — End: 2012-11-19
  Administered 2012-11-19: 650 mg via ORAL

## 2012-11-19 MED ORDER — HEPARIN SOD (PORK) LOCK FLUSH 100 UNIT/ML IV SOLN
500.0000 [IU] | Freq: Every day | INTRAVENOUS | Status: DC | PRN
  Filled 2012-11-19: qty 5

## 2012-11-19 MED ORDER — SODIUM CHLORIDE 0.9 % IV SOLN
250.0000 mL | Freq: Once | INTRAVENOUS | Status: AC
  Administered 2012-11-19: 250 mL via INTRAVENOUS

## 2012-11-19 NOTE — Patient Instructions (Addendum)
Blood Transfusion   A blood transfusion replaces your blood or some of its parts. Blood is replaced when you have lost blood because of surgery, an accident, or for severe blood conditions like anemia.  You can donate blood to be used on yourself if you have a planned surgery. If you lose blood during that surgery, your own blood can be given back to you.  Any blood given to you is checked to make sure it matches your blood type. Your temperature, blood pressure, and heart rate (vital signs) will be checked often.   GET HELP RIGHT AWAY IF:    You feel sick to your stomach (nauseous) or throw up (vomit).   You have watery poop (diarrhea).   You have shortness of breath or trouble breathing.   You have blood in your pee (urine) or have dark colored pee.   You have chest pain or tightness.   Your eyes or skin turn yellow (jaundice).   You have a temperature by mouth above 102 F (38.9 C), not controlled by medicine.   You start to shake and have chills.   You develop a a red rash (hives) or feel itchy.   You develop lightheadedness or feel confused.   You develop back, joint, or muscle pain.   You do not feel hungry (lost appetite).   You feel tired, restless, or nervous.   You develop belly (abdominal) cramps.  Document Released: 10/05/2008 Document Revised: 10/01/2011 Document Reviewed: 10/05/2008  [employer] Patient Information 2013 [employer].

## 2012-11-20 LAB — TYPE AND SCREEN
Donor AG Type: NEGATIVE
Unit division: 0

## 2012-11-21 ENCOUNTER — Telehealth: Payer: Self-pay | Admitting: *Deleted

## 2012-11-21 ENCOUNTER — Other Ambulatory Visit: Payer: Self-pay | Admitting: Oncology

## 2012-11-21 DIAGNOSIS — C9590 Leukemia, unspecified not having achieved remission: Secondary | ICD-10-CM

## 2012-11-21 NOTE — Telephone Encounter (Signed)
sw pt gv appts d/t. Made her aware that she will start having labs every Monday x8. Pt will get her printed schedule on 11/24/12.Marland KitchenMarland KitchenMarland KitchenTD

## 2012-11-24 ENCOUNTER — Ambulatory Visit (HOSPITAL_BASED_OUTPATIENT_CLINIC_OR_DEPARTMENT_OTHER): Payer: Medicare Other | Admitting: Oncology

## 2012-11-24 ENCOUNTER — Ambulatory Visit (HOSPITAL_BASED_OUTPATIENT_CLINIC_OR_DEPARTMENT_OTHER): Payer: Medicare Other

## 2012-11-24 ENCOUNTER — Telehealth: Payer: Self-pay | Admitting: *Deleted

## 2012-11-24 ENCOUNTER — Other Ambulatory Visit: Payer: Medicare Other | Admitting: Lab

## 2012-11-24 ENCOUNTER — Other Ambulatory Visit (HOSPITAL_BASED_OUTPATIENT_CLINIC_OR_DEPARTMENT_OTHER): Payer: Medicare Other | Admitting: Lab

## 2012-11-24 VITALS — BP 128/71 | HR 61 | Temp 97.9°F | Resp 20 | Ht 64.0 in | Wt 143.6 lb

## 2012-11-24 DIAGNOSIS — C9400 Acute erythroid leukemia, not having achieved remission: Secondary | ICD-10-CM

## 2012-11-24 DIAGNOSIS — D709 Neutropenia, unspecified: Secondary | ICD-10-CM

## 2012-11-24 DIAGNOSIS — Z5111 Encounter for antineoplastic chemotherapy: Secondary | ICD-10-CM

## 2012-11-24 DIAGNOSIS — D649 Anemia, unspecified: Secondary | ICD-10-CM

## 2012-11-24 LAB — CBC WITH DIFFERENTIAL/PLATELET
Basophils Absolute: 0 10*3/uL (ref 0.0–0.1)
Eosinophils Absolute: 0 10*3/uL (ref 0.0–0.5)
HCT: 26.6 % — ABNORMAL LOW (ref 34.8–46.6)
HGB: 9.2 g/dL — ABNORMAL LOW (ref 11.6–15.9)
MCV: 90.7 fL (ref 79.5–101.0)
NEUT#: 0.2 10*3/uL — CL (ref 1.5–6.5)
NEUT%: 19.5 % — ABNORMAL LOW (ref 38.4–76.8)
RDW: 15.7 % — ABNORMAL HIGH (ref 11.2–14.5)
lymph#: 1 10*3/uL (ref 0.9–3.3)

## 2012-11-24 LAB — COMPREHENSIVE METABOLIC PANEL (CC13)
Albumin: 3.4 g/dL — ABNORMAL LOW (ref 3.5–5.0)
BUN: 24.6 mg/dL (ref 7.0–26.0)
CO2: 27 mEq/L (ref 22–29)
Calcium: 9.1 mg/dL (ref 8.4–10.4)
Chloride: 108 mEq/L — ABNORMAL HIGH (ref 98–107)
Creatinine: 0.7 mg/dL (ref 0.6–1.1)
Glucose: 102 mg/dl — ABNORMAL HIGH (ref 70–99)
Potassium: 4 mEq/L (ref 3.5–5.1)

## 2012-11-24 MED ORDER — SODIUM CHLORIDE 0.9 % IV SOLN
20.0000 mg/m2 | Freq: Once | INTRAVENOUS | Status: AC
  Administered 2012-11-24: 35 mg via INTRAVENOUS
  Filled 2012-11-24: qty 7

## 2012-11-24 MED ORDER — SODIUM CHLORIDE 0.9 % IV SOLN
Freq: Once | INTRAVENOUS | Status: AC
  Administered 2012-11-24: 18:00:00 via INTRAVENOUS

## 2012-11-24 MED ORDER — ONDANSETRON 8 MG/50ML IVPB (CHCC)
8.0000 mg | Freq: Once | INTRAVENOUS | Status: AC
  Administered 2012-11-24: 8 mg via INTRAVENOUS

## 2012-11-24 MED ORDER — DEXAMETHASONE SODIUM PHOSPHATE 10 MG/ML IJ SOLN
10.0000 mg | Freq: Once | INTRAMUSCULAR | Status: AC
  Administered 2012-11-24: 10 mg via INTRAVENOUS

## 2012-11-24 NOTE — Progress Notes (Signed)
OK to treat despite CBC results today per Dr. Darnelle Catalan.  Attempted Peripheral IV site x 5 by 3 different RNs without success.  Blood return with each stick and then veins would blow.  24 gauge used each attempt.  Chrystie Nose, RN was finally able to get PIV in Left [facility] on 6 th attempt (4th RN). Notified Dr. Darnelle Catalan and he said to let him know if Pt will agree to a PICC line. He will order it.  Pt does not want to have PICC ordered yet but will think about it.

## 2012-11-24 NOTE — Progress Notes (Signed)
ID: [name]   DOB: [birth year]  MR#: [id number]  WUJ#:[id number]  HISTORY OF PRESENT ILLNESS: The patient sees [name] for routine health maintenance. On March 2013 he noted that the patient's hemoglobin was 8.7 (previously it had been 11.1). The MCV was at 107.1. The platelet count was 660,000. The white cell count was 4.2. He obtained a B12 and folate which were within normal limits at 1744 and 8.4 respectively.[name] He checked a ferritin to evaluate the thrombocytosis, and this was 151. Serum protein electrophoresis did not show an M spike and the urine protein electrophoresis was likewise negative. He referred the patient for further evaluation. 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 Today would be day 1 cycle 11 of azacytidine. However I am holding her treatment at this point so I can discuss with [name] what the best next step may be.  REVIEW OF SYSTEMS: She reports no symptoms related to her treatment. She has a normal functional status for her age. She does have helped with the house keeper, but does some of the housework herself. She has a little bit of soreness from the recent bone marrow biopsy, but no bleeding. She has had no fever or rash. Her weight is stable. Overall a detailed review of systems today was noncontributory.  PAST MEDICAL HISTORY: Past Medical History  Diagnosis Date  . Pacemaker     left shoulder  . Cataract     had surgery  . GERD (gastroesophageal reflux disease)   . Glaucoma   . Hyperlipidemia   . Osteoporosis   . Thyroid disease   . PUD (peptic ulcer disease)   . Lichen sclerosus et atrophicus of the vulva   . Intracranial hemorrhage   . Leukemia     PAST SURGICAL HISTORY: Past Surgical History  Procedure Laterality Date  . Cataract extraction w/ intraocular lens  implant, bilateral    . Dilation and curettage of uterus    . Tonsillectomy and  adenoidectomy    . Pacemaker insertion      left shoulder  . Subdural hematoma evacuation via craniotomy  12-04-03    after a fall  . Colonoscopy      FAMILY HISTORY Family History  Problem Relation Age of Onset  . Colon cancer Mother    the patient's father died at the age of 27 from a myocardial infarction. Her mother was diagnosed with colon cancer at the age of 29 and died from that disease within a year. The patient had 2 sisters. One died at the age of 26 with a neck fracture. The patient has 2 brothers. There is no other history of cancer or hematologic problems in the family to her knowledge  GYNECOLOGIC HISTORY: She does not recall when she had menarche. She is GX P1, first live birth age 55. She went through the change of life in her 44s. She never took hormone replacement.  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 Her husband died in Dec 04, 1995 from a heart attack. The patient lives alone, with no pets. Her daughter, [name] lives in [place]. She has a PhD in Water quality scientist and works for [employer]. Her phone number is [phone].The patient's brother [name] lives next door and in case of an emergency she would like [name] to call him first. His phone number is [phone]  ADVANCED DIRECTIVES: in place  HEALTH MAINTENANCE: History  Substance Use Topics  . Smoking status: Never Smoker   . Smokeless tobacco: Never Used  . Alcohol Use: No     Colonoscopy: [facility]/ May 2012  PAP: "no longer"  Bone density: osteoporosis   Lipid panel: [name]             Mammogram: Nov 2012  Allergies  Allergen Reactions  . Sulfa Antibiotics     Pt. States she may have had a reaction a long time ago    Current Outpatient Prescriptions  Medication Sig Dispense Refill  . alendronate (FOSAMAX) 70 MG tablet Take 70 mg by mouth every Saturday. Take with a full glass of water on an empty stomach. Takes on Saturdays       . aspirin 325 MG EC tablet Take 325 mg by mouth every morning.       . bimatoprost (LUMIGAN) 0.03 % ophthalmic solution Place 1 drop into both eyes at bedtime.        . brinzolamide (AZOPT) 1 % ophthalmic suspension Place 1 drop into both eyes 2 (two) times daily.        . Calcium Carbonate-Vitamin D (CALTRATE 600+D PO) Take 1 tablet by mouth 2 (two) times daily with breakfast and lunch.        . Cholecalciferol (VITAMIN D) 2000 UNITS tablet Take 2,000 Units by mouth 2 (two) times daily.        . Cyanocobalamin (VITAMIN B 12 PO) Take 2,000 mcg by mouth daily.      [name] docusate sodium (COLACE) 100 MG capsule Take 100 mg by mouth daily.      . fluconazole (DIFLUCAN) 100 MG tablet Take 1 tablet (100 mg total) by mouth daily.  30 tablet  12  . Folic Acid 20 MG CAPS Take 40 mg by mouth 2 (two) times daily.      [name] levofloxacin (LEVAQUIN) 500 MG tablet Take 1 tablet (500 mg total) by mouth daily.  30 tablet  12  . levothyroxine (SYNTHROID, LEVOTHROID) 88 MCG tablet Take 88 mcg by mouth every morning.       [name] MAGNESIUM-ZINC PO Take 1 tablet by mouth daily.      . Multiple Vitamins-Minerals (MACULAR VITAMIN BENEFIT PO) Take 2 tablets by mouth daily.      [name] nystatin cream (MYCOSTATIN) Apply 1 application topically as needed for dry skin.       [name] OMEGA-3 KRILL OIL 300 MG CAPS Take 1 capsule by mouth daily.        [name] omeprazole (PRILOSEC) 20 MG capsule Take 20 mg by mouth daily.        . pravastatin (PRAVACHOL) 40 MG tablet Take 40 mg by mouth every evening.        No current facility-administered medications for this visit.    OBJECTIVE: Elderly white woman who appears well Filed Vitals:   11/24/12 1444  BP: 128/71  Pulse: 61  Temp: 97.9 F (36.6 C)  Resp: 20     Body mass index is 24.64 kg/(m^2).    ECOG FS: 1  Filed Weights   11/24/12 1444  Weight: 143 lb 9.6 oz (65.137 kg)   Physical Exam: HEENT:  Sclerae anicteric.  Oropharynx clear.  Nodes:  No cervical or supraclavicular  lymphadenopathy  Breast Exam:  Deferred Lungs:  Clear to auscultation bilaterally.  Heart:  Regular rate and rhythm.  Pacemaker in place Abdomen:  Soft, nontender.  Positive bowel sounds. Musculoskeletal:  No focal spinal tenderness  Extremities: No peripheral edema Neuro:  Nonfocal. Well oriented. Positive affect   LAB RESULTS:   % blasts (date)     By flow On marrow  12/11/2011  22%  5-15%  03/19/2012   --  5%  06/12/2012   --  <5%  09/10/2012  5%  5%  11/03/2012  8%  6%   Results for [name], [name] (MRN [id number]) as of 11/17/2012 11:29  Ref. Range 05/12/2012 10:04 06/02/2012 14:49 06/17/2012 10:19 07/08/2012 15:56 07/28/2012 14:59 08/25/2012 14:45 09/01/2012 15:20 09/15/2012 10:12 09/22/2012 15:31 09/29/2012 14:53 10/15/2012 09:00 10/20/2012 11:26 10/30/2012 12:12 11/17/2012 11:25  NEUT# Latest Range: 1.5-6.5 10e3/uL 0.8 (L) 1.1 (L) 1.4 (L) 1.1 (L) 1.0 (L) 0.7 (L) 0.6 (L) 0.6 (L) 0.4 (LL) 0.3 (LL) 0.2 (LL) 0.2 (LL) 0.3 (LL) 0.3 (LL)   Results for [name], [name] (MRN [id number]) as of 11/17/2012 17:19  Ref. Range 10/15/2012 09:00 10/20/2012 11:26 10/30/2012 12:12 11/10/2012 07:20 11/17/2012 11:25  Hemoglobin Latest Range: 12.0-15.0 g/dL 8.5 (L) 8.0 (L) 7.1 (L) 8.3 (L) 7.0 (LL)    Lab Results  Component Value Date   WBC 1.3* 11/24/2012   NEUTROABS 0.2* 11/24/2012   HGB 9.2* 11/24/2012   HCT 26.6* 11/24/2012   MCV 90.7 11/24/2012   PLT 384 11/24/2012      Chemistry      Component Value Date/Time   NA 140 06/17/2012 1019   NA 141 02/06/2012 1047      Component Value Date/Time   CALCIUM 9.0 06/17/2012 1019   CALCIUM 9.1 02/06/2012 1047       STUDIES: Patient Name: [name], [name] Accession #: [id number] DOB: 1928/11/08 Age: 79 Gender: F Client Name [facility] Collected Date: 11/10/2012 Received Date: 11/10/2012 Physician: Art Hoss Chart #: MRN # : [id number] Physician cc: Ruthann Cancer, MD Race:W Visit #: [id number].[id number] BONE MARROW REPORT FINAL DIAGNOSIS Diagnosis Bone  Marrow, Aspirate and Biopsy - HYPERCELLULAR BONE MARROW FOR AGE WITH PERSISTENT LEUKEMIA. - SEE Conner. PERIPHERAL BLOOD: - NORMOCYTIC-NORMOCHROMIC ANEMIA. - LEUKOPENIA WITH CIRCULATING BLASTS. Diagnosis Note The bone marrow shows striking left shifted erythroid proliferation associated with atypical megakaryocytic proliferation and slight increased in myeloblastic cells as see by morphology and flow cytometric studies (generally less than 10%). The findings are similar to previous bone marrow ([id number]) and consistent with the persistence of the leukemia process. Correlation with cytogenetic studies is recommended. (BNS:gt, 05-Dec-2012) Guerry Bruin MD Pathologist, Electronic Signature (Case signed Dec 05, 2012) GROSS AND MICROSCOPIC INFORMATION Specimen Clinical Information re-evaluate response to leukemia therapy (kp) Source Bone Marrow, Aspirate and Biopsy Microscopic LAB DATA: CBC performed on 11/10/2012 shows: WBC 1.2 K/ul Neutrophils 17% HB 8.3 g/dl Lymphocytes 54% 1 of 3 FINAL for Conner, Chloe J ([id number]) Microscopic(continued) HCT 23.9 % Monocytes 2% MCV 92.3 fL Eosinophils 3% RDW 14.8 % Basophils 0% PLT 381 K/ul Blasts 6% PERIPHERAL BLOOD SMEAR: The red blood cells display mild anisocytosis with macrocytic and normocytic cells. There is mild poikilocytosis with elliptocytes and teardrop cells. The white blood cells are decreased in number with scattered medium to large blastic cells with high nuclear cytoplasmic ratio, fine chromatin and small to inconspicuous nucleoli. Auer rods are not identified. Scattered myelocytes are also seen on scan. The maturing neutrophilic cells are decreased in number with toxic granulation. The platelets are abundant. BONE MARROW ASPIRATE: Erythroid precursors: Prominently increased in number with left shifted maturation. Maturing cells display nuclear cytoplasmic dyssynchrony. Granulocytic precursors: Markedly decreased in number. The  blastic cells are estimated at  6% of all cells and consist of medium to large sized cells with high nuclear cytoplasmic ratio, fine chromatin and small to inconspicuous nucleoli. No Auer rods are identified. Markedly decreased maturing neutrophilic cells display hypogranularity. Megakaryocytes: Increased in number with mostly small hypolobated/unilobated forms or small forms with separate nuclear lobes. Lymphocytes/plasma cells: Large aggregates not present. TOUCH PREPARATIONS: Predominance of erythroid precursors. CLOT and BIOPSY: The sections show 50 to 70% with prominent increase in immature mononuclear cells throughout the bone marrow likely correlating with the abundant erythroid component seen in the aspirate. Megakaryocytes are also abundant with many small abnormal forms. Maturing neutrophilic cells are decreased in number. IRON STAIN: Iron stains are performed on a bone marrow aspirate smear and section of clot. The controls stained appropriately. Storage Iron: Increased. Ringed Sideroblasts: Abundant. ADDITIONAL DATA / TESTING: Specimen was sent for cytogenetic analysis and a separate report will follow. Flow cytometric analysis shows a slight increase in myeloblastic cells ([id number]). Specimen Table Bone Marrow count performed on 500 cells shows: Blasts: 6% Myeloid 9% Promyelocyts: 0% Myelocytes: 1% Erythroid 71% Metamyelocyts: 0% Bands: 1% Lymphocytes: 18% Neutrophils: 0% Eosinophils: 1% Plasma Cells: 2% Basophils: 0% Monocytes: 0% M:E ratio: 0.1 2 of 3 FINAL for [name]  ASSESSMENT: 79 y.o.  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   (1) started azacytidine 01/14/2012, stopped after 10 cycles (last dose 10/24/2012) because of worsening neutropenia and increasing transfusion requirements with bone marrow biopsy showing continuing hyper cellularity and slight increase in blast count (to 6%).  (2) anemia, s/p transfusion 8 units PRBCs (02/04/2012, 02/28/2012, 10/01/2012 and 11/07/2012)  (3) neutropenia--progressive  (4) thrombocytosis--now normalized  PLAN:  I discussed her situation with [name] and relayed his recommendations to [facility] today. We could increase the azacytidine 200 mg per meter squared, or switch to decitabine, which has the advantage of being given over 5 days instead of 7, but the disadvantage of being given intravenously. My feeling is we are more likely to get a response by making the change and so we are doing that. [name] has a good understanding of the possible toxicities, side effects and complications of decitabine.  Unfortunately we don't have other good options. We could do intensive treatment for daily ML, but I don't think she would survive that. The last option of course is to stop treatment and consider comfort/palliative care. If she does not have a good response to decitabine, she understands that would be the next step.  She had an appointment with [name] for may 20th, but I think we can postpone that to the third week in June, by which time she will have had 2 cycles of decitabine and a repeat bone marrow biopsy. In the meantime we are checking [name] is bloodwork every Monday and transfusing as needed. She knows to call for any problems that may develop before the next visit here.   Pookela Sellin C    11/24/2012

## 2012-11-24 NOTE — Telephone Encounter (Signed)
appts made and printed. Also made pt aware that a tx will be added for 6/2-6/5 soon...td

## 2012-11-24 NOTE — Patient Instructions (Addendum)
[facility] Discharge Instructions for Patients Receiving Chemotherapy  Today you received the following chemotherapy agents : Dacogen To help prevent nausea and vomiting after your treatment, we encourage you to take your nausea medication as directed.   If you develop nausea and vomiting that is not controlled by your nausea medication, call the clinic. If it is after clinic hours your family physician or the after hours number for the clinic or go to the Emergency Department.   BELOW ARE SYMPTOMS THAT SHOULD BE REPORTED IMMEDIATELY:  *FEVER GREATER THAN 100.5 F  *CHILLS WITH OR WITHOUT FEVER  NAUSEA AND VOMITING THAT IS NOT CONTROLLED WITH YOUR NAUSEA MEDICATION  *UNUSUAL SHORTNESS OF BREATH  *UNUSUAL BRUISING OR BLEEDING  TENDERNESS IN MOUTH AND THROAT WITH OR WITHOUT PRESENCE OF ULCERS  *URINARY PROBLEMS  *BOWEL PROBLEMS  UNUSUAL RASH Items with * indicate a potential emergency and should be followed up as soon as possible.  One of the nurses will contact you 24 hours after your treatment. Please let the nurse know about any problems that you may have experienced. Feel free to call the clinic you have any questions or concerns. The clinic phone number is [phone].   I have been informed and understand all the instructions given to me. I know to contact the clinic, my physician, or go to the Emergency Department if any problems should occur. I do not have any questions at this time, but understand that I may call the clinic during office hours   should I have any questions or need assistance in obtaining follow up care.    __________________________________________  _____________  __________ Signature of Patient or Authorized Representative            Date                   Time    __________________________________________ Nurse's Signature

## 2012-11-25 ENCOUNTER — Ambulatory Visit (HOSPITAL_BASED_OUTPATIENT_CLINIC_OR_DEPARTMENT_OTHER): Payer: Medicare Other

## 2012-11-25 VITALS — BP 130/57 | HR 74 | Temp 97.8°F | Resp 20

## 2012-11-25 DIAGNOSIS — D473 Essential (hemorrhagic) thrombocythemia: Secondary | ICD-10-CM

## 2012-11-25 DIAGNOSIS — C9400 Acute erythroid leukemia, not having achieved remission: Secondary | ICD-10-CM

## 2012-11-25 DIAGNOSIS — Z5111 Encounter for antineoplastic chemotherapy: Secondary | ICD-10-CM

## 2012-11-25 DIAGNOSIS — D72819 Decreased white blood cell count, unspecified: Secondary | ICD-10-CM

## 2012-11-25 DIAGNOSIS — D649 Anemia, unspecified: Secondary | ICD-10-CM

## 2012-11-25 DIAGNOSIS — I629 Nontraumatic intracranial hemorrhage, unspecified: Secondary | ICD-10-CM

## 2012-11-25 MED ORDER — SODIUM CHLORIDE 0.9 % IV SOLN
Freq: Once | INTRAVENOUS | Status: AC
  Administered 2012-11-25: 09:00:00 via INTRAVENOUS

## 2012-11-25 MED ORDER — ONDANSETRON 8 MG/50ML IVPB (CHCC)
8.0000 mg | Freq: Once | INTRAVENOUS | Status: AC
  Administered 2012-11-25: 8 mg via INTRAVENOUS

## 2012-11-25 MED ORDER — DEXAMETHASONE SODIUM PHOSPHATE 10 MG/ML IJ SOLN
10.0000 mg | Freq: Once | INTRAMUSCULAR | Status: AC
  Administered 2012-11-25: 10 mg via INTRAVENOUS

## 2012-11-25 MED ORDER — SODIUM CHLORIDE 0.9 % IV SOLN
20.0000 mg/m2 | Freq: Once | INTRAVENOUS | Status: AC
  Administered 2012-11-25: 35 mg via INTRAVENOUS
  Filled 2012-11-25: qty 7

## 2012-11-25 NOTE — Patient Instructions (Addendum)
[facility] Discharge Instructions for Patients Receiving Chemotherapy  Today you received the following chemotherapy agents Dacogen  To help prevent nausea and vomiting after your treatment, we encourage you to take your nausea medication.    If you develop nausea and vomiting that is not controlled by your nausea medication, call the clinic. If it is after clinic hours your family physician or the after hours number for the clinic or go to the Emergency Department.   BELOW ARE SYMPTOMS THAT SHOULD BE REPORTED IMMEDIATELY:  *FEVER GREATER THAN 100.5 F  *CHILLS WITH OR WITHOUT FEVER  NAUSEA AND VOMITING THAT IS NOT CONTROLLED WITH YOUR NAUSEA MEDICATION  *UNUSUAL SHORTNESS OF BREATH  *UNUSUAL BRUISING OR BLEEDING  TENDERNESS IN MOUTH AND THROAT WITH OR WITHOUT PRESENCE OF ULCERS  *URINARY PROBLEMS  *BOWEL PROBLEMS  UNUSUAL RASH Items with * indicate a potential emergency and should be followed up as soon as possible.  One of the nurses will contact you 24 hours after your treatment. Please let the nurse know about any problems that you may have experienced. Feel free to call the clinic you have any questions or concerns. The clinic phone number is [phone].   I have been informed and understand all the instructions given to me. I know to contact the clinic, my physician, or go to the Emergency Department if any problems should occur. I do not have any questions at this time, but understand that I may call the clinic during office hours   should I have any questions or need assistance in obtaining follow up care.    __________________________________________  _____________  __________ Signature of Patient or Authorized Representative            Date                   Time    __________________________________________ Nurse's Signature

## 2012-11-26 ENCOUNTER — Ambulatory Visit (HOSPITAL_BASED_OUTPATIENT_CLINIC_OR_DEPARTMENT_OTHER): Payer: Medicare Other

## 2012-11-26 VITALS — BP 134/61 | HR 67 | Temp 97.9°F

## 2012-11-26 DIAGNOSIS — D649 Anemia, unspecified: Secondary | ICD-10-CM

## 2012-11-26 DIAGNOSIS — I629 Nontraumatic intracranial hemorrhage, unspecified: Secondary | ICD-10-CM

## 2012-11-26 DIAGNOSIS — C9400 Acute erythroid leukemia, not having achieved remission: Secondary | ICD-10-CM

## 2012-11-26 DIAGNOSIS — Z5111 Encounter for antineoplastic chemotherapy: Secondary | ICD-10-CM

## 2012-11-26 DIAGNOSIS — D473 Essential (hemorrhagic) thrombocythemia: Secondary | ICD-10-CM

## 2012-11-26 DIAGNOSIS — D72819 Decreased white blood cell count, unspecified: Secondary | ICD-10-CM

## 2012-11-26 MED ORDER — SODIUM CHLORIDE 0.9 % IV SOLN
Freq: Once | INTRAVENOUS | Status: AC
  Administered 2012-11-26: 09:00:00 via INTRAVENOUS

## 2012-11-26 MED ORDER — DECITABINE CHEMO INJECTION 50 MG
20.0000 mg/m2 | Freq: Once | INTRAVENOUS | Status: AC
  Administered 2012-11-26: 35 mg via INTRAVENOUS
  Filled 2012-11-26: qty 7

## 2012-11-26 MED ORDER — DEXAMETHASONE SODIUM PHOSPHATE 10 MG/ML IJ SOLN
10.0000 mg | Freq: Once | INTRAMUSCULAR | Status: AC
  Administered 2012-11-26: 10 mg via INTRAVENOUS

## 2012-11-26 MED ORDER — ONDANSETRON 8 MG/50ML IVPB (CHCC)
8.0000 mg | Freq: Once | INTRAVENOUS | Status: AC
  Administered 2012-11-26: 8 mg via INTRAVENOUS

## 2012-11-26 NOTE — Patient Instructions (Signed)
[facility] Discharge Instructions for Patients Receiving Chemotherapy  Today you received the following chemotherapy agents Dacogen To help prevent nausea and vomiting after your treatment, we encourage you to take your nausea medication as prescribed. If you develop nausea and vomiting that is not controlled by your nausea medication, call the clinic. If it is after clinic hours your family physician or the after hours number for the clinic or go to the Emergency Department.   BELOW ARE SYMPTOMS THAT SHOULD BE REPORTED IMMEDIATELY:  *FEVER GREATER THAN 100.5 F  *CHILLS WITH OR WITHOUT FEVER  NAUSEA AND VOMITING THAT IS NOT CONTROLLED WITH YOUR NAUSEA MEDICATION  *UNUSUAL SHORTNESS OF BREATH  *UNUSUAL BRUISING OR BLEEDING  TENDERNESS IN MOUTH AND THROAT WITH OR WITHOUT PRESENCE OF ULCERS  *URINARY PROBLEMS  *BOWEL PROBLEMS  UNUSUAL RASH Items with * indicate a potential emergency and should be followed up as soon as possible.  One of the nurses will contact you 24 hours after your treatment. Please let the nurse know about any problems that you may have experienced. Feel free to call the clinic you have any questions or concerns. The clinic phone number is [phone].   I have been informed and understand all the instructions given to me. I know to contact the clinic, my physician, or go to the Emergency Department if any problems should occur. I do not have any questions at this time, but understand that I may call the clinic during office hours   should I have any questions or need assistance in obtaining follow up care.    __________________________________________  _____________  __________ Signature of Patient or Authorized Representative            Date                   Time    __________________________________________ Nurse's Signature    

## 2012-11-27 ENCOUNTER — Ambulatory Visit (HOSPITAL_BASED_OUTPATIENT_CLINIC_OR_DEPARTMENT_OTHER): Payer: Medicare Other

## 2012-11-27 VITALS — BP 147/75 | Temp 97.9°F

## 2012-11-27 DIAGNOSIS — D72819 Decreased white blood cell count, unspecified: Secondary | ICD-10-CM

## 2012-11-27 DIAGNOSIS — Z5111 Encounter for antineoplastic chemotherapy: Secondary | ICD-10-CM

## 2012-11-27 DIAGNOSIS — D473 Essential (hemorrhagic) thrombocythemia: Secondary | ICD-10-CM

## 2012-11-27 DIAGNOSIS — I629 Nontraumatic intracranial hemorrhage, unspecified: Secondary | ICD-10-CM

## 2012-11-27 DIAGNOSIS — D649 Anemia, unspecified: Secondary | ICD-10-CM

## 2012-11-27 DIAGNOSIS — C9400 Acute erythroid leukemia, not having achieved remission: Secondary | ICD-10-CM

## 2012-11-27 MED ORDER — ONDANSETRON 8 MG/50ML IVPB (CHCC)
8.0000 mg | Freq: Once | INTRAVENOUS | Status: AC
  Administered 2012-11-27: 8 mg via INTRAVENOUS

## 2012-11-27 MED ORDER — SODIUM CHLORIDE 0.9 % IV SOLN
20.0000 mg/m2 | Freq: Once | INTRAVENOUS | Status: AC
  Administered 2012-11-27: 35 mg via INTRAVENOUS
  Filled 2012-11-27: qty 7

## 2012-11-27 MED ORDER — DEXAMETHASONE SODIUM PHOSPHATE 10 MG/ML IJ SOLN
10.0000 mg | Freq: Once | INTRAMUSCULAR | Status: AC
  Administered 2012-11-27: 10 mg via INTRAVENOUS

## 2012-11-27 MED ORDER — SODIUM CHLORIDE 0.9 % IV SOLN
Freq: Once | INTRAVENOUS | Status: AC
  Administered 2012-11-27: 13:00:00 via INTRAVENOUS

## 2012-11-27 NOTE — Patient Instructions (Addendum)
[facility] Discharge Instructions for Patients Receiving Chemotherapy  Today you received the following chemotherapy agents Dacogen.  To help prevent nausea and vomiting after your treatment, we encourage you to take your nausea medication as prescribed.   If you develop nausea and vomiting that is not controlled by your nausea medication, call the clinic. If it is after clinic hours your family physician or the after hours number for the clinic or go to the Emergency Department.   BELOW ARE SYMPTOMS THAT SHOULD BE REPORTED IMMEDIATELY:  *FEVER GREATER THAN 100.5 F  *CHILLS WITH OR WITHOUT FEVER  NAUSEA AND VOMITING THAT IS NOT CONTROLLED WITH YOUR NAUSEA MEDICATION  *UNUSUAL SHORTNESS OF BREATH  *UNUSUAL BRUISING OR BLEEDING  TENDERNESS IN MOUTH AND THROAT WITH OR WITHOUT PRESENCE OF ULCERS  *URINARY PROBLEMS  *BOWEL PROBLEMS  UNUSUAL RASH Items with * indicate a potential emergency and should be followed up as soon as possible.  Feel free to call the clinic you have any questions or concerns. The clinic phone number is [phone].   I have been informed and understand all the instructions given to me. I know to contact the clinic, my physician, or go to the Emergency Department if any problems should occur. I do not have any questions at this time, but understand that I may call the clinic during office hours   should I have any questions or need assistance in obtaining follow up care.    __________________________________________  _____________  __________ Signature of Patient or Authorized Representative            Date                   Time    __________________________________________ Nurse's Signature    

## 2012-11-28 ENCOUNTER — Ambulatory Visit (HOSPITAL_BASED_OUTPATIENT_CLINIC_OR_DEPARTMENT_OTHER): Payer: Medicare Other

## 2012-11-28 VITALS — BP 152/65 | HR 60 | Temp 97.2°F | Resp 18

## 2012-11-28 DIAGNOSIS — D649 Anemia, unspecified: Secondary | ICD-10-CM

## 2012-11-28 DIAGNOSIS — I629 Nontraumatic intracranial hemorrhage, unspecified: Secondary | ICD-10-CM

## 2012-11-28 DIAGNOSIS — D72819 Decreased white blood cell count, unspecified: Secondary | ICD-10-CM

## 2012-11-28 DIAGNOSIS — D473 Essential (hemorrhagic) thrombocythemia: Secondary | ICD-10-CM

## 2012-11-28 DIAGNOSIS — C9400 Acute erythroid leukemia, not having achieved remission: Secondary | ICD-10-CM

## 2012-11-28 DIAGNOSIS — Z5111 Encounter for antineoplastic chemotherapy: Secondary | ICD-10-CM

## 2012-11-28 MED ORDER — ONDANSETRON 8 MG/50ML IVPB (CHCC)
8.0000 mg | Freq: Once | INTRAVENOUS | Status: AC
  Administered 2012-11-28: 8 mg via INTRAVENOUS

## 2012-11-28 MED ORDER — SODIUM CHLORIDE 0.9 % IV SOLN
20.0000 mg/m2 | Freq: Once | INTRAVENOUS | Status: AC
  Administered 2012-11-28: 35 mg via INTRAVENOUS
  Filled 2012-11-28: qty 7

## 2012-11-28 MED ORDER — DEXAMETHASONE SODIUM PHOSPHATE 10 MG/ML IJ SOLN
10.0000 mg | Freq: Once | INTRAMUSCULAR | Status: AC
  Administered 2012-11-28: 10 mg via INTRAVENOUS

## 2012-11-28 MED ORDER — SODIUM CHLORIDE 0.9 % IV SOLN
Freq: Once | INTRAVENOUS | Status: AC
  Administered 2012-11-28: 12:00:00 via INTRAVENOUS

## 2012-11-28 NOTE — Patient Instructions (Addendum)
[facility] Discharge Instructions for Patients Receiving Chemotherapy  Today you received the following chemotherapy agents :  Dacogen.  To help prevent nausea and vomiting after your treatment, we encourage you to take your nausea medication as instructed by your physician.    If you develop nausea and vomiting that is not controlled by your nausea medication, call the clinic. If it is after clinic hours your family physician or the after hours number for the clinic or go to the Emergency Department.   BELOW ARE SYMPTOMS THAT SHOULD BE REPORTED IMMEDIATELY:  *FEVER GREATER THAN 100.5 F  *CHILLS WITH OR WITHOUT FEVER  NAUSEA AND VOMITING THAT IS NOT CONTROLLED WITH YOUR NAUSEA MEDICATION  *UNUSUAL SHORTNESS OF BREATH  *UNUSUAL BRUISING OR BLEEDING  TENDERNESS IN MOUTH AND THROAT WITH OR WITHOUT PRESENCE OF ULCERS  *URINARY PROBLEMS  *BOWEL PROBLEMS  UNUSUAL RASH Items with * indicate a potential emergency and should be followed up as soon as possible.  One of the nurses will contact you 24 hours after your treatment. Please let the nurse know about any problems that you may have experienced. Feel free to call the clinic you have any questions or concerns. The clinic phone number is [phone].   I have been informed and understand all the instructions given to me. I know to contact the clinic, my physician, or go to the Emergency Department if any problems should occur. I do not have any questions at this time, but understand that I may call the clinic during office hours   should I have any questions or need assistance in obtaining follow up care.    __________________________________________  _____________  __________ Signature of Patient or Authorized Representative            Date                   Time    __________________________________________ Nurse's Signature

## 2012-11-29 ENCOUNTER — Other Ambulatory Visit: Payer: Self-pay | Admitting: Oncology

## 2012-11-29 ENCOUNTER — Encounter: Payer: Self-pay | Admitting: Oncology

## 2012-12-01 ENCOUNTER — Other Ambulatory Visit (HOSPITAL_BASED_OUTPATIENT_CLINIC_OR_DEPARTMENT_OTHER): Payer: Medicare Other

## 2012-12-01 DIAGNOSIS — C9400 Acute erythroid leukemia, not having achieved remission: Secondary | ICD-10-CM

## 2012-12-01 DIAGNOSIS — C9590 Leukemia, unspecified not having achieved remission: Secondary | ICD-10-CM

## 2012-12-01 DIAGNOSIS — D649 Anemia, unspecified: Secondary | ICD-10-CM

## 2012-12-01 LAB — CBC WITH DIFFERENTIAL/PLATELET
Eosinophils Absolute: 0 10*3/uL (ref 0.0–0.5)
HCT: 26 % — ABNORMAL LOW (ref 34.8–46.6)
LYMPH%: 77.5 % — ABNORMAL HIGH (ref 14.0–49.7)
MONO#: 0 10*3/uL — ABNORMAL LOW (ref 0.1–0.9)
NEUT#: 0.2 10*3/uL — CL (ref 1.5–6.5)
NEUT%: 17.5 % — ABNORMAL LOW (ref 38.4–76.8)
Platelets: 424 10*3/uL — ABNORMAL HIGH (ref 145–400)
WBC: 1 10*3/uL — ABNORMAL LOW (ref 3.9–10.3)

## 2012-12-02 ENCOUNTER — Telehealth: Payer: Self-pay | Admitting: Oncology

## 2012-12-04 ENCOUNTER — Ambulatory Visit: Payer: Medicare Other | Admitting: Lab

## 2012-12-04 ENCOUNTER — Encounter: Payer: Self-pay | Admitting: Physician Assistant

## 2012-12-04 ENCOUNTER — Encounter (HOSPITAL_COMMUNITY): Payer: Self-pay | Admitting: Pharmacy Technician

## 2012-12-04 ENCOUNTER — Telehealth: Payer: Self-pay | Admitting: *Deleted

## 2012-12-04 ENCOUNTER — Encounter (HOSPITAL_COMMUNITY)
Admission: RE | Admit: 2012-12-04 | Discharge: 2012-12-04 | Disposition: A | Discharge status: Home / Self Care | Payer: Medicare Other | Source: Ambulatory Visit | Attending: Oncology | Admitting: Oncology

## 2012-12-04 ENCOUNTER — Ambulatory Visit (HOSPITAL_BASED_OUTPATIENT_CLINIC_OR_DEPARTMENT_OTHER): Payer: Medicare Other | Admitting: Physician Assistant

## 2012-12-04 ENCOUNTER — Other Ambulatory Visit (HOSPITAL_BASED_OUTPATIENT_CLINIC_OR_DEPARTMENT_OTHER): Payer: Medicare Other | Admitting: Lab

## 2012-12-04 VITALS — BP 102/63 | HR 80 | Temp 97.9°F | Resp 20 | Ht 64.0 in | Wt 143.5 lb

## 2012-12-04 DIAGNOSIS — I878 Other specified disorders of veins: Secondary | ICD-10-CM

## 2012-12-04 DIAGNOSIS — C9590 Leukemia, unspecified not having achieved remission: Secondary | ICD-10-CM

## 2012-12-04 DIAGNOSIS — D630 Anemia in neoplastic disease: Secondary | ICD-10-CM | POA: Insufficient documentation

## 2012-12-04 DIAGNOSIS — C9400 Acute erythroid leukemia, not having achieved remission: Secondary | ICD-10-CM | POA: Insufficient documentation

## 2012-12-04 DIAGNOSIS — I998 Other disorder of circulatory system: Secondary | ICD-10-CM

## 2012-12-04 DIAGNOSIS — D72819 Decreased white blood cell count, unspecified: Secondary | ICD-10-CM

## 2012-12-04 LAB — CBC WITH DIFFERENTIAL/PLATELET
BASO%: 0 % (ref 0.0–2.0)
EOS%: 1.2 % (ref 0.0–7.0)
HCT: 24.2 % — ABNORMAL LOW (ref 34.8–46.6)
LYMPH%: 87.8 % — ABNORMAL HIGH (ref 14.0–49.7)
MCH: 29.9 pg (ref 25.1–34.0)
MCHC: 33.1 g/dL (ref 31.5–36.0)
MONO#: 0 10*3/uL — ABNORMAL LOW (ref 0.1–0.9)
NEUT%: 9.8 % — ABNORMAL LOW (ref 38.4–76.8)
Platelets: 241 10*3/uL (ref 145–400)

## 2012-12-04 NOTE — Progress Notes (Signed)
ID: [name]   DOB: 1928/09/29  MR#: [id number]  OZH#:[id number]  HISTORY OF PRESENT ILLNESS: The patient sees Dr. [name] for routine health maintenance. On March 2013 he noted that the patient's hemoglobin was 8.7 (previously it had been 11.1). The MCV was at 107.1. The platelet count was 660,000. The white cell count was 4.2. He obtained a B12 and folate which were within normal limits at 1744 and 8.4 respectively.[name] He checked a ferritin to evaluate the thrombocytosis, and this was 151. Serum protein electrophoresis did not show an M spike and the urine protein electrophoresis was likewise negative. He referred the patient for further evaluation. 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 Today is day 11 cycle 1 of the package and, given on days one through 5 of each 28 day cycle. She tolerated this first cycle quite well, with "no problems" other than the fact that she has poor venous access, and often requires multiple needle sticks. She is very interested in having a port placed.  Otherwise, [name] is really feeling remarkably well.   Interval history is remarkable for the fact that [name] had a dental visit earlier this morning and had her teeth cleaned. She tells me she needs a "small filling" on one of her front teeth, and that is tentatively scheduled with her dentist, Dr. Adria Devon, on may 20th.   REVIEW OF SYSTEMS: [name] is has had no fevers or chills. She continues on Levaquin daily. She denies any rashes, skin changes, abnormal bruising, or bleeding. Her appetite is fair and she has no nausea or change in bowel habits. No cough, increased shortness of breath, chest pain, or palpitations. She denies any abnormal headaches or dizziness, and currently denies any unusual myalgias, arthralgias, bony pain, or peripheral swelling.  A detailed review of systems is otherwise stable and  noncontributory.    PAST MEDICAL HISTORY: Past Medical History  Diagnosis Date  . Pacemaker     left shoulder  . Cataract     had surgery  . GERD (gastroesophageal reflux disease)   . Glaucoma   . Hyperlipidemia   . Osteoporosis   . Thyroid disease   . PUD (peptic ulcer disease)   . Lichen sclerosus et atrophicus of the vulva   . Intracranial hemorrhage   . Leukemia     PAST SURGICAL HISTORY: Past Surgical History  Procedure Laterality Date  . Cataract extraction w/ intraocular lens  implant, bilateral    . Dilation and curettage of uterus    . Tonsillectomy and adenoidectomy    . Pacemaker insertion      left shoulder  . Subdural hematoma evacuation via craniotomy  2005    after a fall  . Colonoscopy      FAMILY HISTORY Family History  Problem Relation Age of Onset  . Colon cancer Mother    the patient's father died at the age of 49 from a myocardial infarction. Her mother was diagnosed with colon cancer at the age of 88 and died from that disease within a year. The patient had 2 sisters. One died at the age of 56 with a neck fracture. The patient has 2 brothers. There is no other history of cancer or hematologic problems in the family to her knowledge  GYNECOLOGIC HISTORY: She does not recall when she had menarche. She is GX P1, first live birth age 79. She went through the change of life  in her 46s. She never took hormone replacement.  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 Her husband died in 1995-12-10 from a heart attack. The patient lives alone, with no pets. Her daughter, [name] lives in [place]. She has a PhD in Water quality scientist and works for [employer]. Her phone number is [phone].The patient's brother [name] lives next door and in case of an emergency she would like [name] to call him first. His phone number is [phone]   ADVANCED DIRECTIVES: in place  HEALTH  MAINTENANCE: History  Substance Use Topics  . Smoking status: Never Smoker   . Smokeless tobacco: Never Used  . Alcohol Use: No     Colonoscopy: [facility]/ 12/10/10 PAP: "no longer"  Bone density: osteoporosis   Lipid panel: [name]             Mammogram: Nov 2012  Allergies  Allergen Reactions  . Sulfa Antibiotics     Pt. States she may have had a reaction a long time ago    Current Outpatient Prescriptions  Medication Sig Dispense Refill  . alendronate (FOSAMAX) 70 MG tablet Take 70 mg by mouth every 2022-12-10. Take with a full glass of water on an empty stomach. Takes on Saturdays      . aspirin 325 MG EC tablet Take 325 mg by mouth every morning.       . bimatoprost (LUMIGAN) 0.03 % ophthalmic solution Place 1 drop into both eyes at bedtime.        . brinzolamide (AZOPT) 1 % ophthalmic suspension Place 1 drop into both eyes 2 (two) times daily.        . Calcium Carbonate-Vitamin D (CALTRATE 600+D PO) Take 1 tablet by mouth 2 (two) times daily with breakfast and lunch.        . Cholecalciferol (VITAMIN D) 2000 UNITS tablet Take 2,000 Units by mouth 2 (two) times daily.        . Cyanocobalamin (VITAMIN B 12 PO) Take 2,000 mcg by mouth daily.      [name] docusate sodium (COLACE) 100 MG capsule Take 100 mg by mouth daily.      . fluconazole (DIFLUCAN) 100 MG tablet Take 1 tablet (100 mg total) by mouth daily.  30 tablet  12  . Folic Acid 20 MG CAPS Take 40 mg by mouth 2 (two) times daily.      [name] levofloxacin (LEVAQUIN) 500 MG tablet Take 1 tablet (500 mg total) by mouth daily.  30 tablet  12  . levothyroxine (SYNTHROID, LEVOTHROID) 88 MCG tablet Take 88 mcg by mouth every morning.       [name] MAGNESIUM-ZINC PO Take 1 tablet by mouth daily.      . Multiple Vitamins-Minerals (MACULAR VITAMIN BENEFIT PO) Take 2 tablets by mouth daily.      [name] nystatin cream (MYCOSTATIN) Apply 1 application topically daily as needed for dry skin.       [name] OMEGA-3 KRILL OIL 300 MG CAPS Take 1 capsule by mouth  daily.        [name] omeprazole (PRILOSEC) 20 MG capsule Take 20 mg by mouth daily.        . pravastatin (PRAVACHOL) 40 MG tablet Take 40 mg by mouth every evening.        No current facility-administered medications for this visit.    OBJECTIVE: Elderly white woman who appears well Filed Vitals:   12/04/12 [phone]  BP: 102/63  Pulse: 80  Temp: 97.9 F (36.6 C)  Resp: 20     Body mass index is 24.62 kg/(m^2).    ECOG FS: 1  Filed Weights   12/04/12 0953  Weight: 143 lb 8 oz (65.091 kg)   Physical Exam: HEENT:  Sclerae anicteric.  Oropharynx clear.  Nodes:  No cervical, axillary or supraclavicular lymphadenopathy  Breast Exam:  Deferred Lungs:  Clear to auscultation bilaterally. No wheezes or rhonchi Heart:  Regular rate and rhythm.  Pacemaker in place Abdomen:  Soft, nontender to palpation.  Positive bowel sounds. Musculoskeletal:  No focal spinal tenderness  Extremities: No peripheral edema Neuro:  Nonfocal. Well oriented. Positive affect   LAB RESULTS:   % blasts (date)     By flow On marrow  12/11/2011  22%  5-15%  03/19/2012   --  5%  06/12/2012   --  <5%  09/10/2012  5%  5%  11/03/2012  8%  6%    Lab Results  Component Value Date   WBC 0.8* 12/04/2012   NEUTROABS 0.1* 12/04/2012   HGB 8.0* 12/04/2012   HCT 24.2* 12/04/2012   MCV 90.3 12/04/2012   PLT 241 12/04/2012    CMP     Component Value Date/Time   NA 141 11/24/2012 1424   NA 141 02/06/2012 1047   K 4.0 11/24/2012 1424   K 3.9 02/06/2012 1047   CL 108* 11/24/2012 1424   CL 105 02/06/2012 1047   CO2 27 11/24/2012 1424   CO2 27 02/06/2012 1047   GLUCOSE 102* 11/24/2012 1424   GLUCOSE 92 02/06/2012 1047   BUN 24.6 11/24/2012 1424   BUN 20 02/06/2012 1047   CREATININE 0.7 11/24/2012 1424   CREATININE 0.72 02/06/2012 1047   CALCIUM 9.1 11/24/2012 1424   CALCIUM 9.1 02/06/2012 1047   PROT 6.4 11/24/2012 1424   PROT 6.1 11/30/2011 1034   ALBUMIN 3.4* 11/24/2012 1424   ALBUMIN 4.0 11/30/2011 1034   AST 15 11/24/2012 1424   AST  19 11/30/2011 1034   ALT 14 11/24/2012 1424   ALT 16 11/30/2011 1034   ALKPHOS 61 11/24/2012 1424   ALKPHOS 50 11/30/2011 1034   BILITOT 0.71 11/24/2012 1424   BILITOT 0.4 11/30/2011 1034   GFRNONAA 78* 02/06/2012 1047   GFRAA >90 02/06/2012 1047     STUDIES: Patient Name: Chloe, Conner Accession #: [id number] DOB: 08/05/1928 Age: 53 Gender: F Client Name [facility] Collected Date: 11/10/2012 Received Date: 11/10/2012 Physician: Art Hoss Chart #: MRN # : [id number] Physician cc: Ruthann Cancer, MD Race:W Visit #: [id number] BONE MARROW REPORT FINAL DIAGNOSIS Diagnosis Bone Marrow, Aspirate and Biopsy - HYPERCELLULAR BONE MARROW FOR AGE WITH PERSISTENT LEUKEMIA. - SEE Conner. PERIPHERAL BLOOD: - NORMOCYTIC-NORMOCHROMIC ANEMIA. - LEUKOPENIA WITH CIRCULATING BLASTS. Diagnosis Note The bone marrow shows striking left shifted erythroid proliferation associated with atypical megakaryocytic proliferation and slight increased in myeloblastic cells as see by morphology and flow cytometric studies (generally less than 10%). The findings are similar to previous bone marrow ([id number]) and consistent with the persistence of the leukemia process. Correlation with cytogenetic studies is recommended. (BNS:gt, 12/06/12) Jessica Priest SMIR MD Pathologist, Electronic Signature (Case signed 12/06/2012) GROSS AND MICROSCOPIC INFORMATION Specimen Clinical Information re-evaluate response to leukemia therapy (kp) Source Bone Marrow, Aspirate and Biopsy Microscopic LAB DATA: CBC performed on 11/10/2012 shows: WBC 1.2 K/ul Neutrophils 17% HB 8.3 g/dl Lymphocytes 40% 1 of 3 FINAL for [name], [name] ([id number]) Microscopic(continued) HCT 23.9 % Monocytes 2%  MCV 92.3 fL Eosinophils 3% RDW 14.8 % Basophils 0% PLT 381 K/ul Blasts 6% PERIPHERAL BLOOD SMEAR: The red blood cells display mild anisocytosis with macrocytic and normocytic cells. There is mild poikilocytosis with elliptocytes and  teardrop cells. The white blood cells are decreased in number with scattered medium to large blastic cells with high nuclear cytoplasmic ratio, fine chromatin and small to inconspicuous nucleoli. Auer rods are not identified. Scattered myelocytes are also seen on scan. The maturing neutrophilic cells are decreased in number with toxic granulation. The platelets are abundant. BONE MARROW ASPIRATE: Erythroid precursors: Prominently increased in number with left shifted maturation. Maturing cells display nuclear cytoplasmic dyssynchrony. Granulocytic precursors: Markedly decreased in number. The blastic cells are estimated at 6% of all cells and consist of medium to large sized cells with high nuclear cytoplasmic ratio, fine chromatin and small to inconspicuous nucleoli. No Auer rods are identified. Markedly decreased maturing neutrophilic cells display hypogranularity. Megakaryocytes: Increased in number with mostly small hypolobated/unilobated forms or small forms with separate nuclear lobes. Lymphocytes/plasma cells: Large aggregates not present. TOUCH PREPARATIONS: Predominance of erythroid precursors. CLOT and BIOPSY: The sections show 50 to 70% with prominent increase in immature mononuclear cells throughout the bone marrow likely correlating with the abundant erythroid component seen in the aspirate. Megakaryocytes are also abundant with many small abnormal forms. Maturing neutrophilic cells are decreased in number. IRON STAIN: Iron stains are performed on a bone marrow aspirate smear and section of clot. The controls stained appropriately. Storage Iron: Increased. Ringed Sideroblasts: Abundant. ADDITIONAL DATA / TESTING: Specimen was sent for cytogenetic analysis and a separate report will follow. Flow cytometric analysis shows a slight increase in myeloblastic cells ([id number]). Specimen Table Bone Marrow count performed on 500 cells shows: Blasts: 6% Myeloid 9% Promyelocyts:  0% Myelocytes: 1% Erythroid 71% Metamyelocyts: 0% Bands: 1% Lymphocytes: 18% Neutrophils: 0% Eosinophils: 1% Plasma Cells: 2% Basophils: 0% Monocytes: 0% M:E ratio: 0.1 2 of 3 FINAL for HA   ASSESSMENT: 79 y.o.  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   (1) started azacytidine 01/14/2012, stopped after 10 cycles (last dose 10/24/2012) because of worsening neutropenia and increasing transfusion requirements with bone marrow biopsy showing continuing hyper cellularity and slight increase in blast count (to 6%).  (2) anemia, s/p transfusion 8 units PRBCs (02/04/2012, 02/28/2012, 10/01/2012 and 11/07/2012)  (3) neutropenia--progressive, afebrile  (4) thrombocytosis--now normalized  (5)  Started Dacogen (decitabine), given IV on days 1-5 of each 28 day cycle, first dose given on 11/24/2012.   PLAN:  I reviewed Lowanda's case with Dr. Darnelle Catalan. Overall, she seems to have tolerated the Dacogen (decitabine) well, and we will make no changes to your current plan. Specifically, we'll continue to check labs on a weekly basis, and I will see her back on June 2 in anticipation of day 1 cycle 2.  We have recommended that he proceed with blood transfusion of packed red blood cells, especially in light of her rapid decline of hemoglobin from 9.0 on 12/01/2012 to 8.0 today. We'll obtain a type and cross today, and she will return tomorrow for her transfusion. We will  recheck those labs on Monday, May 19.  Krisa would also like a port placed as soon as  possible, prior to her next cycle in June, and we will refer her to interventional radiology accordingly.  With regards to her dental procedure, our desk nurse is contacting her dentist, Adria Devon, DDS, to make sure his office is aware of her neutropenic status. He can make a decision at that point whether or not the small filling is a procedure that needs to be done immediately, or could be delayed.  Of course she continues on Levaquin and knows to call us at any time she has a fever of 100 or above.  Jun voices understanding and agreement with this plan, and call with any changes prior to her next appointment.   Cahterine Heinzel    12/04/2012

## 2012-12-04 NOTE — Telephone Encounter (Signed)
appts was made and printed. Pt is aware that Inetta Fermo will call her wilth her port placement appt per Inetta Fermo. i also emailed [facility] and asked her to place the order so that Inetta Fermo can get everything that she needs to make this appt....td

## 2012-12-05 ENCOUNTER — Ambulatory Visit (HOSPITAL_BASED_OUTPATIENT_CLINIC_OR_DEPARTMENT_OTHER): Payer: Medicare Other

## 2012-12-05 ENCOUNTER — Telehealth: Payer: Self-pay | Admitting: *Deleted

## 2012-12-05 ENCOUNTER — Other Ambulatory Visit: Payer: Self-pay | Admitting: Radiology

## 2012-12-05 VITALS — BP 133/72 | HR 72 | Temp 98.2°F | Resp 20

## 2012-12-05 DIAGNOSIS — D630 Anemia in neoplastic disease: Secondary | ICD-10-CM

## 2012-12-05 DIAGNOSIS — D649 Anemia, unspecified: Secondary | ICD-10-CM

## 2012-12-05 MED ORDER — ACETAMINOPHEN 325 MG PO TABS
650.0000 mg | ORAL_TABLET | Freq: Once | ORAL | Status: AC
  Administered 2012-12-05: 650 mg via ORAL

## 2012-12-05 MED ORDER — DIPHENHYDRAMINE HCL 25 MG PO CAPS
25.0000 mg | ORAL_CAPSULE | Freq: Once | ORAL | Status: AC
  Administered 2012-12-05: 25 mg via ORAL

## 2012-12-05 NOTE — Patient Instructions (Addendum)
Blood Transfusion Information WHAT IS A BLOOD TRANSFUSION? A transfusion is the replacement of blood or some of its parts. Blood is made up of multiple cells which provide different functions.  Red blood cells carry oxygen and are used for blood loss replacement.  White blood cells fight against infection.  Platelets control bleeding.  Plasma helps clot blood.  Other blood products are available for specialized needs, such as hemophilia or other clotting disorders. BEFORE THE TRANSFUSION  Who gives blood for transfusions?   You may be able to donate blood to be used at a later date on yourself (autologous donation).  Relatives can be asked to donate blood. This is generally not any safer than if you have received blood from a stranger. The same precautions are taken to ensure safety when a relative's blood is donated.  Healthy volunteers who are fully evaluated to make sure their blood is safe. This is blood bank blood. Transfusion therapy is the safest it has ever been in the practice of medicine. Before blood is taken from a donor, a complete history is taken to make sure that person has no history of diseases nor engages in risky social behavior (examples are intravenous drug use or sexual activity with multiple partners). The donor's travel history is screened to minimize risk of transmitting infections, such as malaria. The donated blood is tested for signs of infectious diseases, such as HIV and hepatitis. The blood is then tested to be sure it is compatible with you in order to minimize the chance of a transfusion reaction. If you or a relative donates blood, this is often done in anticipation of surgery and is not appropriate for emergency situations. It takes many days to process the donated blood. RISKS AND COMPLICATIONS Although transfusion therapy is very safe and saves many lives, the main dangers of transfusion include:   Getting an infectious disease.  Developing a  transfusion reaction. This is an allergic reaction to something in the blood you were given. Every precaution is taken to prevent this. The decision to have a blood transfusion has been considered carefully by your caregiver before blood is given. Blood is not given unless the benefits outweigh the risks. AFTER THE TRANSFUSION  Right after receiving a blood transfusion, you will usually feel much better and more energetic. This is especially true if your red blood cells have gotten low (anemic). The transfusion raises the level of the red blood cells which carry oxygen, and this usually causes an energy increase.  The nurse administering the transfusion will monitor you carefully for complications. HOME CARE INSTRUCTIONS  No special instructions are needed after a transfusion. You may find your energy is better. Speak with your caregiver about any limitations on activity for underlying diseases you may have. SEEK MEDICAL CARE IF:   Your condition is not improving after your transfusion.  You develop redness or irritation at the intravenous (IV) site. SEEK IMMEDIATE MEDICAL CARE IF:  Any of the following symptoms occur over the next 12 hours:  Shaking chills.  You have a temperature by mouth above 102 F (38.9 C), not controlled by medicine.  Chest, back, or muscle pain.  People around you feel you are not acting correctly or are confused.  Shortness of breath or difficulty breathing.  Dizziness and fainting.  You get a rash or develop hives.  You have a decrease in urine output.  Your urine turns a dark color or changes to pink, red, or brown. Any of the following   symptoms occur over the next 10 days:  You have a temperature by mouth above 102 F (38.9 C), not controlled by medicine.  Shortness of breath.  Weakness after normal activity.  The white part of the eye turns yellow (jaundice).  You have a decrease in the amount of urine or are urinating less often.  Your  urine turns a dark color or changes to pink, red, or brown. Document Released: 07/06/2000 Document Revised: 10/01/2011 Document Reviewed: 02/23/2008 Exit[employer] Patient Information 2013 [employer].  

## 2012-12-05 NOTE — Telephone Encounter (Signed)
This RN called to pt's dentist, Dr Adria Devon and obtained identified VM.  Message left stating pt says she is scheduled for a dental procedure next week. This office wants Dr Melvyn Neth to be aware of pt's diagnosis, labs and current therapy for appropriate coordination of care. This RN's name and direct number given for return call.

## 2012-12-07 LAB — TYPE AND SCREEN
Antibody Screen: NEGATIVE
Donor AG Type: NEGATIVE
Unit division: 0
Unit division: 0

## 2012-12-08 ENCOUNTER — Other Ambulatory Visit (HOSPITAL_BASED_OUTPATIENT_CLINIC_OR_DEPARTMENT_OTHER): Payer: Medicare Other | Admitting: Lab

## 2012-12-08 DIAGNOSIS — C9400 Acute erythroid leukemia, not having achieved remission: Secondary | ICD-10-CM

## 2012-12-08 LAB — CBC WITH DIFFERENTIAL/PLATELET
BASO%: 0 % (ref 0.0–2.0)
Basophils Absolute: 0 10*3/uL (ref 0.0–0.1)
HCT: 31.1 % — ABNORMAL LOW (ref 34.8–46.6)
HGB: 10.4 g/dL — ABNORMAL LOW (ref 11.6–15.9)
MCHC: 33.4 g/dL (ref 31.5–36.0)
MONO#: 0 10*3/uL — ABNORMAL LOW (ref 0.1–0.9)
NEUT#: 0 10*3/uL — CL (ref 1.5–6.5)
NEUT%: 1.4 % — ABNORMAL LOW (ref 38.4–76.8)
WBC: 0.7 10*3/uL — CL (ref 3.9–10.3)
lymph#: 0.7 10*3/uL — ABNORMAL LOW (ref 0.9–3.3)

## 2012-12-09 ENCOUNTER — Ambulatory Visit (HOSPITAL_COMMUNITY)
Admission: RE | Admit: 2012-12-09 | Discharge: 2012-12-09 | Disposition: A | Discharge status: Home / Self Care | Payer: Medicare Other | Source: Ambulatory Visit | Attending: Physician Assistant | Admitting: Physician Assistant

## 2012-12-09 ENCOUNTER — Encounter (HOSPITAL_COMMUNITY): Payer: Self-pay

## 2012-12-09 ENCOUNTER — Ambulatory Visit (HOSPITAL_COMMUNITY)
Admission: RE | Admit: 2012-12-09 | Discharge: 2012-12-09 | Disposition: A | Discharge status: Home / Self Care | Payer: Medicare Other | Source: Ambulatory Visit | Attending: Oncology | Admitting: Oncology

## 2012-12-09 ENCOUNTER — Other Ambulatory Visit: Payer: Self-pay | Admitting: Physician Assistant

## 2012-12-09 DIAGNOSIS — C9400 Acute erythroid leukemia, not having achieved remission: Secondary | ICD-10-CM

## 2012-12-09 DIAGNOSIS — I998 Other disorder of circulatory system: Secondary | ICD-10-CM | POA: Insufficient documentation

## 2012-12-09 DIAGNOSIS — I878 Other specified disorders of veins: Secondary | ICD-10-CM

## 2012-12-09 DIAGNOSIS — Z8673 Personal history of transient ischemic attack (TIA), and cerebral infarction without residual deficits: Secondary | ICD-10-CM | POA: Insufficient documentation

## 2012-12-09 DIAGNOSIS — D708 Other neutropenia: Secondary | ICD-10-CM | POA: Insufficient documentation

## 2012-12-09 DIAGNOSIS — Z79899 Other long term (current) drug therapy: Secondary | ICD-10-CM | POA: Insufficient documentation

## 2012-12-09 DIAGNOSIS — E785 Hyperlipidemia, unspecified: Secondary | ICD-10-CM | POA: Insufficient documentation

## 2012-12-09 DIAGNOSIS — M810 Age-related osteoporosis without current pathological fracture: Secondary | ICD-10-CM | POA: Insufficient documentation

## 2012-12-09 LAB — CBC
MCH: 30 pg (ref 26.0–34.0)
MCV: 86.1 fL (ref 78.0–100.0)
Platelets: 106 10*3/uL — ABNORMAL LOW (ref 150–400)
RDW: 14.8 % (ref 11.5–15.5)
WBC: 0.6 10*3/uL — CL (ref 4.0–10.5)

## 2012-12-09 MED ORDER — LIDOCAINE HCL 1 % IJ SOLN
INTRAMUSCULAR | Status: AC
  Filled 2012-12-09: qty 20

## 2012-12-09 MED ORDER — FENTANYL CITRATE 0.05 MG/ML IJ SOLN
INTRAMUSCULAR | Status: AC | PRN
  Administered 2012-12-09: 100 ug via INTRAVENOUS

## 2012-12-09 MED ORDER — HEPARIN SOD (PORK) LOCK FLUSH 100 UNIT/ML IV SOLN
INTRAVENOUS | Status: AC | PRN
  Administered 2012-12-09: 500 [IU] via INTRAVENOUS

## 2012-12-09 MED ORDER — MIDAZOLAM HCL 2 MG/2ML IJ SOLN
INTRAMUSCULAR | Status: AC | PRN
  Administered 2012-12-09: 2 mg via INTRAVENOUS

## 2012-12-09 MED ORDER — FENTANYL CITRATE 0.05 MG/ML IJ SOLN
INTRAMUSCULAR | Status: AC
  Filled 2012-12-09: qty 6

## 2012-12-09 MED ORDER — VANCOMYCIN HCL IN DEXTROSE 1-5 GM/200ML-% IV SOLN
1000.0000 mg | INTRAVENOUS | Status: AC
  Administered 2012-12-09: 1000 mg via INTRAVENOUS
  Filled 2012-12-09: qty 200

## 2012-12-09 MED ORDER — HEPARIN (PORCINE) LOCK FLUSH 10 UNIT/ML IV SOLN
INTRAVENOUS | Status: AC | PRN
  Administered 2012-12-09: 10 [IU]

## 2012-12-09 MED ORDER — SODIUM CHLORIDE 0.9 % IV SOLN
INTRAVENOUS | Status: DC
  Administered 2012-12-09: 12:00:00 via INTRAVENOUS

## 2012-12-09 MED ORDER — MIDAZOLAM HCL 2 MG/2ML IJ SOLN
INTRAMUSCULAR | Status: AC
  Filled 2012-12-09: qty 6

## 2012-12-09 NOTE — Progress Notes (Signed)
CRITICAL VALUE ALERT  Critical value received:  WBC 0.6  Date of notification:  12-09-12  Time of notification:  1228  Critical value read back:yes  Nurse who received alert:  Donzetta Kohut RT(R)(VI)  MD notified (1st page):  Dr. Deanne Coffer   Time of first page:  1228  MD notified (2nd page):  Time of second page:  Responding MD: Dr. Deanne Coffer  Time MD responded:  1228  Plan to continue with procedure as scheduled.

## 2012-12-09 NOTE — H&P (Signed)
Chief Complaint: "I'm here for a port" Referring Physician:Magrinat HPI: [name] is an 79 y.o. female with hx of leukemia. She is receiving chemotherapy and is starting to have some IV access issues, so she is referred for [facility] placement. PMHx and meds reviewed. Feels well. On chronic Levaquin with known chronic leukopenia/neutropenia.  Past Medical History:  Past Medical History  Diagnosis Date  . Pacemaker     left shoulder  . Cataract     had surgery  . GERD (gastroesophageal reflux disease)   . Glaucoma   . Hyperlipidemia   . Osteoporosis   . Thyroid disease   . PUD (peptic ulcer disease)   . Lichen sclerosus et atrophicus of the vulva   . Intracranial hemorrhage   . Leukemia     Past Surgical History:  Past Surgical History  Procedure Laterality Date  . Cataract extraction w/ intraocular lens  implant, bilateral    . Dilation and curettage of uterus    . Tonsillectomy and adenoidectomy    . Pacemaker insertion      left shoulder  . Subdural hematoma evacuation via craniotomy  2005    after a fall  . Colonoscopy      Family History:  Family History  Problem Relation Age of Onset  . Colon cancer Mother     Social History:  reports that she has never smoked. She has never used smokeless tobacco. She reports that she does not drink alcohol or use illicit drugs.  Allergies:  Allergies  Allergen Reactions  . Sulfa Antibiotics     Pt. States she may have had a reaction a long time ago    Medications: alendronate (FOSAMAX) 70 MG tablet (Taking) Sig - Route: Take 70 mg by mouth every Saturday. Take with a full glass of water on an empty stomach. Takes on Saturdays - Oral Class: Historical Med Number of times this order has been changed since signing: 3 Order [address] aspirin 325 MG EC tablet (Taking) Sig - Route: Take 325 mg by mouth every morning. - Oral Class: Historical Med Number of times this order has been changed since signing: 3 [address]  bimatoprost (LUMIGAN) 0.03 % ophthalmic solution (Taking) Sig - Route: Place 1 drop into both eyes at bedtime. - Both Eyes Class: Historical Med Number of times this order has been changed since signing: 1 [address] brinzolamide (AZOPT) 1 % ophthalmic suspension (Taking) Sig - Route: Place 1 drop into both eyes 2 (two) times daily. - Both Eyes Class: Historical Med Number of times this order has been changed since signing: 1 Order [address] Calcium Carbonate-Vitamin D (CALTRATE 600+D PO) (Taking) Sig - Route: Take 1 tablet by mouth 2 (two) times daily with breakfast and lunch. - Oral Class: Historical Med Number of times this order has been changed since signing: 1 Order [address] Cholecalciferol (VITAMIN D) 2000 UNITS tablet (Taking) Sig - Route: Take 2,000 Units by mouth 2 (two) times daily. - Oral Class: Historical Med Number of times this order has been changed since signing: 1 Order [address] Cyanocobalamin (VITAMIN B 12 PO) (Taking) Sig - Route: Take 2,000 mcg by mouth daily. - Oral Class: Historical Med Number of times this order has been changed since signing: 1 Order [address] docusate sodium (COLACE) 100 MG capsule (Taking) Sig - Route: Take 100 mg by mouth daily. - Oral Class: Historical Med Number of times this order has been changed since signing: 1 Order [address]  fluconazole (DIFLUCAN) 100 MG tablet (Taking) 30 tablet 12 11/17/2012 Sig - Route: Take 1 tablet (100 mg total) by mouth daily. - Oral Class: Print Number of times this order has been changed since signing: 1 Order [address] Folic Acid 20 MG CAPS (Taking) Sig - Route: Take 40 mg by mouth 2 (two) times daily. - Oral Class: Historical Med levofloxacin (LEVAQUIN) 500 MG tablet (Taking) 30 tablet 12 11/17/2012 Sig - Route: Take 1 tablet (500 mg total) by mouth daily. - Oral Class: Print Number of times this order has been changed since signing: 1 Order [address] levothyroxine (SYNTHROID, LEVOTHROID) 88 MCG tablet (Taking) Sig  - Route: Take 88 mcg by mouth every morning. - Oral Class: Historical Med Number of times this order has been changed since signing: 2 Order [address] MAGNESIUM-ZINC PO (Taking) Sig - Route: Take 1 tablet by mouth daily. - Oral Class: Historical Med Multiple Vitamins-Minerals (MACULAR VITAMIN BENEFIT PO) (Taking) Sig - Route: Take 2 tablets by mouth daily. - Oral Class: Historical Med Number of times this order has been changed since signing: 1 Order [address] nystatin cream (MYCOSTATIN) (Taking) Sig - Route: Apply 1 application topically daily as needed for dry skin. - Topical Class: Historical Med Number of times this order has been changed since signing: 5 Order [address] OMEGA-3 KRILL OIL 300 MG CAPS (Taking) Sig - Route: Take 1 capsule by mouth daily. - Oral Class: Historical Med Number of times this order has been changed since signing: 1 Order [address] omeprazole (PRILOSEC) 20 MG capsule (Taking) Sig - Route: Take 20 mg by mouth daily. - Oral Class: Historical Med Number of times this order has been changed since signing: 1 Order [address] pravastatin (PRAVACHOL) 40 MG tablet (Taking) Sig - Route: Take 40 mg by mouth every evening   Please HPI for pertinent positives, otherwise complete 10 system ROS negative.  Physical Exam: BP 150/61  Pulse 64  Temp(Src) 98 F (36.7 C) (Oral)  Ht 5\' 4"  (1.626 m)  Wt 140 lb (63.504 kg)  BMI 24.02 kg/m2  SpO2 100% Body mass index is 24.02 kg/(m^2).   General Appearance:  Alert, cooperative, no distress, appears stated age  Head:  Normocephalic, without obvious abnormality, atraumatic  ENT: Unremarkable  Neck: Supple, symmetrical, trachea midline  Lungs:   Clear to auscultation bilaterally, no w/r/r, respirations unlabored without use of accessory muscles.  Chest Wall:  No tenderness or deformity  Heart:  Regular rate and rhythm, S1, S2 normal, no murmur, rub or gallop.  Abdomen:   Soft, non-tender, non distended.  Extremities: Extremities  normal, atraumatic, no cyanosis or edema  Pulses: 2+ and symmetric  Neurologic: Normal affect, no gross deficits.   Results for orders placed during the hospital encounter of 12/09/12 (from the past 48 hour(s))  CBC     Status: Abnormal   Collection Time    12/09/12 11:45 AM      Result Value Range   WBC 0.6 (*) 4.0 - 10.5 K/uL   Comment: RESULT REPEATED AND VERIFIED     WHITE COUNT CONFIRMED ON SMEAR     CRITICAL RESULT CALLED TO, READ BACK BY AND VERIFIED WITH:     KIRKMAN, J. AT 1228 ON 05.20.14 BY HOBBINS, J.   RBC 3.67 (*) 3.87 - 5.11 MIL/uL   Hemoglobin 11.0 (*) 12.0 - 15.0 g/dL   HCT 16.1 (*) 09.6 - 04.5 %   MCV 86.1  78.0 - 100.0 fL   MCH 30.0  26.0 - 34.0 pg   MCHC 34.8  30.0 - 36.0 g/dL   RDW 16.1  09.6 - 04.5 %   Platelets 106 (*) 150 - 400 K/uL   Comment: RESULT REPEATED AND VERIFIED     SPECIMEN CHECKED FOR CLOTS     PLATELET COUNT CONFIRMED BY SMEAR  PROTIME-INR     Status: None   Collection Time    12/09/12 12:12 PM      Result Value Range   Prothrombin Time 14.1  11.6 - 15.2 seconds   INR 1.10  0.00 - 1.49  APTT     Status: None   Collection Time    12/09/12 12:12 PM      Result Value Range   aPTT 28  24 - 37 seconds   No results found.  Assessment/Plan Leukemia For Port placement. Discussed procedure, risks, complications, use of sedation. Labs reviewed. Consent signed in chart  Brayton El PA-C 12/09/2012, 12:55 PM

## 2012-12-09 NOTE — Procedures (Signed)
R IJ Port No complication No blood loss. See complete dictation in [facility].

## 2012-12-16 ENCOUNTER — Other Ambulatory Visit (HOSPITAL_BASED_OUTPATIENT_CLINIC_OR_DEPARTMENT_OTHER): Payer: Medicare Other

## 2012-12-16 DIAGNOSIS — C9590 Leukemia, unspecified not having achieved remission: Secondary | ICD-10-CM

## 2012-12-16 LAB — CBC WITH DIFFERENTIAL/PLATELET
BASO%: 0.2 % (ref 0.0–2.0)
EOS%: 0.5 % (ref 0.0–7.0)
HCT: 27.6 % — ABNORMAL LOW (ref 34.8–46.6)
LYMPH%: 73.2 % — ABNORMAL HIGH (ref 14.0–49.7)
MCH: 29.8 pg (ref 25.1–34.0)
MCHC: 34.1 g/dL (ref 31.5–36.0)
MCV: 87.2 fL (ref 79.5–101.0)
NEUT%: 8.1 % — ABNORMAL LOW (ref 38.4–76.8)
Platelets: 233 10*3/uL (ref 145–400)

## 2012-12-22 ENCOUNTER — Other Ambulatory Visit (HOSPITAL_COMMUNITY): Payer: Self-pay | Admitting: Oncology

## 2012-12-22 ENCOUNTER — Ambulatory Visit (HOSPITAL_BASED_OUTPATIENT_CLINIC_OR_DEPARTMENT_OTHER): Payer: Medicare Other | Admitting: Physician Assistant

## 2012-12-22 ENCOUNTER — Ambulatory Visit (HOSPITAL_BASED_OUTPATIENT_CLINIC_OR_DEPARTMENT_OTHER): Payer: Medicare Other

## 2012-12-22 ENCOUNTER — Other Ambulatory Visit: Payer: Self-pay | Admitting: Physician Assistant

## 2012-12-22 ENCOUNTER — Telehealth: Payer: Self-pay | Admitting: Oncology

## 2012-12-22 ENCOUNTER — Encounter: Payer: Self-pay | Admitting: Oncology

## 2012-12-22 ENCOUNTER — Encounter: Payer: Self-pay | Admitting: Physician Assistant

## 2012-12-22 ENCOUNTER — Telehealth: Payer: Self-pay | Admitting: *Deleted

## 2012-12-22 ENCOUNTER — Other Ambulatory Visit (HOSPITAL_BASED_OUTPATIENT_CLINIC_OR_DEPARTMENT_OTHER): Payer: Medicare Other | Admitting: Lab

## 2012-12-22 ENCOUNTER — Other Ambulatory Visit: Payer: Medicare Other | Admitting: Lab

## 2012-12-22 VITALS — BP 123/69 | HR 90 | Temp 97.8°F | Resp 20 | Ht 64.0 in | Wt 142.2 lb

## 2012-12-22 DIAGNOSIS — C9400 Acute erythroid leukemia, not having achieved remission: Secondary | ICD-10-CM

## 2012-12-22 DIAGNOSIS — I629 Nontraumatic intracranial hemorrhage, unspecified: Secondary | ICD-10-CM

## 2012-12-22 DIAGNOSIS — D473 Essential (hemorrhagic) thrombocythemia: Secondary | ICD-10-CM

## 2012-12-22 DIAGNOSIS — D649 Anemia, unspecified: Secondary | ICD-10-CM

## 2012-12-22 DIAGNOSIS — D72819 Decreased white blood cell count, unspecified: Secondary | ICD-10-CM

## 2012-12-22 DIAGNOSIS — C9590 Leukemia, unspecified not having achieved remission: Secondary | ICD-10-CM

## 2012-12-22 DIAGNOSIS — Z5111 Encounter for antineoplastic chemotherapy: Secondary | ICD-10-CM

## 2012-12-22 DIAGNOSIS — D709 Neutropenia, unspecified: Secondary | ICD-10-CM

## 2012-12-22 LAB — CBC WITH DIFFERENTIAL/PLATELET
BASO%: 0.7 % (ref 0.0–2.0)
EOS%: 0.2 % (ref 0.0–7.0)
HCT: 26.3 % — ABNORMAL LOW (ref 34.8–46.6)
MCH: 29.6 pg (ref 25.1–34.0)
MCHC: 34.1 g/dL (ref 31.5–36.0)
MONO%: 18.7 % — ABNORMAL HIGH (ref 0.0–14.0)
NEUT%: 15.2 % — ABNORMAL LOW (ref 38.4–76.8)
RDW: 14.7 % — ABNORMAL HIGH (ref 11.2–14.5)
lymph#: 0.8 10*3/uL — ABNORMAL LOW (ref 0.9–3.3)

## 2012-12-22 MED ORDER — SODIUM CHLORIDE 0.9 % IV SOLN
20.0000 mg/m2 | Freq: Once | INTRAVENOUS | Status: AC
  Administered 2012-12-22: 35 mg via INTRAVENOUS
  Filled 2012-12-22: qty 7

## 2012-12-22 MED ORDER — DEXAMETHASONE SODIUM PHOSPHATE 10 MG/ML IJ SOLN
10.0000 mg | Freq: Once | INTRAMUSCULAR | Status: AC
  Administered 2012-12-22: 10 mg via INTRAVENOUS

## 2012-12-22 MED ORDER — LIDOCAINE-PRILOCAINE 2.5-2.5 % EX CREA: TOPICAL_CREAM | CUTANEOUS | Status: AC | PRN

## 2012-12-22 MED ORDER — ONDANSETRON 8 MG/50ML IVPB (CHCC)
8.0000 mg | Freq: Once | INTRAVENOUS | Status: AC
  Administered 2012-12-22: 8 mg via INTRAVENOUS

## 2012-12-22 NOTE — Patient Instructions (Signed)
[facility] Discharge Instructions for Patients Receiving Chemotherapy  Today you received the following chemotherapy agents Dacogen  To help prevent nausea and vomiting after your treatment, we encourage you to take your nausea medication    If you develop nausea and vomiting that is not controlled by your nausea medication, call the clinic.   BELOW ARE SYMPTOMS THAT SHOULD BE REPORTED IMMEDIATELY:  *FEVER GREATER THAN 100.5 F  *CHILLS WITH OR WITHOUT FEVER  NAUSEA AND VOMITING THAT IS NOT CONTROLLED WITH YOUR NAUSEA MEDICATION  *UNUSUAL SHORTNESS OF BREATH  *UNUSUAL BRUISING OR BLEEDING  TENDERNESS IN MOUTH AND THROAT WITH OR WITHOUT PRESENCE OF ULCERS  *URINARY PROBLEMS  *BOWEL PROBLEMS  UNUSUAL RASH Items with * indicate a potential emergency and should be followed up as soon as possible.  Feel free to call the clinic you have any questions or concerns. The clinic phone number is [phone].    

## 2012-12-22 NOTE — Progress Notes (Signed)
ID: [name]   DOB: Nov 26, 1928  MR#: [id number]  CSN#:[id number]  HISTORY OF PRESENT ILLNESS: The patient sees Dr. [name] for routine health maintenance. On March 2013 he noted that the patient's hemoglobin was 8.7 (previously it had been 11.1). The MCV was at 107.1. The platelet count was 660,000. The white cell count was 4.2. He obtained a B12 and folate which were within normal limits at 1744 and 8.4 respectively.[name] He checked a ferritin to evaluate the thrombocytosis, and this was 151. Serum protein electrophoresis did not show an M spike and the urine protein electrophoresis was likewise negative. He referred the patient for further evaluation. 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 She is due for day 1 cycle 2 of Dacogen, given on days 1 through 5 of each 28 day cycle.  She continues to tolerate this treatment well. She did have a port placed last week with no complications. She was given a prescription for EMLA cream today, and we reviewed how to utilize that cream.   REVIEW OF SYSTEMS: [name] is has had no fevers or chills. She continues on Levaquin daily. She denies any rashes, skin changes, abnormal bruising, or bleeding. Her appetite is fair and she has no nausea or change in bowel habits. No cough, increased shortness of breath, chest pain, or palpitations. She denies any abnormal headaches or dizziness, and currently denies any unusual myalgias, arthralgias, bony pain, or peripheral swelling.  Her energy level is surprisingly quite good, "80% of normal" she tells me.  A detailed review of systems is otherwise stable and noncontributory.    PAST MEDICAL HISTORY: Past Medical History  Diagnosis Date  . Pacemaker     left shoulder  . Cataract     had surgery  . GERD (gastroesophageal reflux disease)   . Glaucoma   . Hyperlipidemia   . Osteoporosis   . Thyroid disease   . PUD  (peptic ulcer disease)   . Lichen sclerosus et atrophicus of the vulva   . Intracranial hemorrhage   . Leukemia     PAST SURGICAL HISTORY: Past Surgical History  Procedure Laterality Date  . Cataract extraction w/ intraocular lens  implant, bilateral    . Dilation and curettage of uterus    . Tonsillectomy and adenoidectomy    . Pacemaker insertion      left shoulder  . Subdural hematoma evacuation via craniotomy  2003/12/14    after a fall  . Colonoscopy      FAMILY HISTORY Family History  Problem Relation Age of Onset  . Colon cancer Mother    the patient's father died at the age of 2 from a myocardial infarction. Her mother was diagnosed with colon cancer at the age of 107 and died from that disease within a year. The patient had 2 sisters. One died at the age of 39 with a neck fracture. The patient has 2 brothers. There is no other history of cancer or hematologic problems in the family to her knowledge  GYNECOLOGIC HISTORY: She does not recall when she had menarche. She is GX P1, first live birth age 44. She went through the change of life in her 62s. She never took hormone replacement.  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 Her husband died in 12-14-1995 from a heart attack. The patient lives alone, with no pets. Her  daughter, [name] lives in [place]. She has a PhD in Water quality scientist and works for [employer]. Her phone number is [phone].The patient's brother [name] lives next door and in case of an emergency she would like [name] to call him first. His phone number is [phone]   ADVANCED DIRECTIVES: in place  HEALTH MAINTENANCE: History  Substance Use Topics  . Smoking status: Never Smoker   . Smokeless tobacco: Never Used  . Alcohol Use: No     Colonoscopy: [facility]/ May 2012  PAP: "no longer"  Bone density: osteoporosis   Lipid panel: [name]             Mammogram: Nov 2012  Allergies   Allergen Reactions  . Sulfa Antibiotics     Pt. States she may have had a reaction a long time ago    Current Outpatient Prescriptions  Medication Sig Dispense Refill  . alendronate (FOSAMAX) 70 MG tablet Take 70 mg by mouth every Saturday. Take with a full glass of water on an empty stomach. Takes on Saturdays      . amoxicillin (AMOXIL) 500 MG capsule       . aspirin 325 MG EC tablet Take 325 mg by mouth every morning.       . bimatoprost (LUMIGAN) 0.03 % ophthalmic solution Place 1 drop into both eyes at bedtime.        . brinzolamide (AZOPT) 1 % ophthalmic suspension Place 1 drop into both eyes 2 (two) times daily.        . Calcium Carbonate-Vitamin D (CALTRATE 600+D PO) Take 1 tablet by mouth 2 (two) times daily with breakfast and lunch.        . Cholecalciferol (VITAMIN D) 2000 UNITS tablet Take 2,000 Units by mouth 2 (two) times daily.        . Cyanocobalamin (VITAMIN B 12 PO) Take 2,000 mcg by mouth daily.      [name] docusate sodium (COLACE) 100 MG capsule Take 100 mg by mouth daily.      . fluconazole (DIFLUCAN) 100 MG tablet Take 1 tablet (100 mg total) by mouth daily.  30 tablet  12  . Folic Acid 20 MG CAPS Take 40 mg by mouth 2 (two) times daily.      [name] levofloxacin (LEVAQUIN) 500 MG tablet Take 1 tablet (500 mg total) by mouth daily.  30 tablet  12  . levothyroxine (SYNTHROID, LEVOTHROID) 88 MCG tablet Take 88 mcg by mouth every morning.       [name] MAGNESIUM-ZINC PO Take 1 tablet by mouth daily.      . Multiple Vitamins-Minerals (MACULAR VITAMIN BENEFIT PO) Take 2 tablets by mouth daily.      [name] nystatin cream (MYCOSTATIN) Apply 1 application topically daily as needed for dry skin.       [name] OMEGA-3 KRILL OIL 300 MG CAPS Take 1 capsule by mouth daily.        [name] omeprazole (PRILOSEC) 20 MG capsule Take 20 mg by mouth daily.        . pravastatin (PRAVACHOL) 40 MG tablet Take 40 mg by mouth every evening.       . lidocaine-prilocaine (EMLA) cream Apply topically as needed.  30 g  2   No  current facility-administered medications for this visit.    OBJECTIVE: Elderly white woman who appears well Filed Vitals:   12/22/12 0854  BP: 123/69  Pulse: 90  Temp: 97.8 F (36.6 C)  Resp: 20  Body mass index is 24.4 kg/(m^2).    ECOG FS: 1  Filed Weights   12/22/12 0854  Weight: 142 lb 3.2 oz (64.501 kg)   Physical Exam: HEENT:  Sclerae anicteric.  Oropharynx clear. No evidence of candidiasis Nodes:  No cervical, axillary or supraclavicular lymphadenopathy  Breast Exam:  Deferred Lungs:  Clear to auscultation bilaterally. No wheezes or rhonchi Heart:  Regular rate and rhythm.  Pacemaker in place Abdomen:  Soft, nontender to palpation.  Positive bowel sounds. Musculoskeletal:  No focal spinal tenderness to palpation. Port is intact in the right upper chest wall with no erythema, edema, or evidence of infection. There is mild ecchymoses status post placement. Extremities: No peripheral edema Neuro:  Nonfocal. Well oriented. Positive affect    LAB RESULTS:   % blasts (date)     By flow On marrow  12/11/2011  22%  5-15%  03/19/2012   --  5%  06/12/2012   --  <5%  09/10/2012  5%  5%  11/03/2012  8%  6%    Lab Results  Component Value Date   WBC 1.2* 12/22/2012   NEUTROABS 0.2* 12/22/2012   HGB 8.9* 12/22/2012   HCT 26.3* 12/22/2012   MCV 86.9 12/22/2012   PLT 417* 12/22/2012    CMP     Component Value Date/Time   NA 141 11/24/2012 1424   NA 141 02/06/2012 1047   K 4.0 11/24/2012 1424   K 3.9 02/06/2012 1047   CL 108* 11/24/2012 1424   CL 105 02/06/2012 1047   CO2 27 11/24/2012 1424   CO2 27 02/06/2012 1047   GLUCOSE 102* 11/24/2012 1424   GLUCOSE 92 02/06/2012 1047   BUN 24.6 11/24/2012 1424   BUN 20 02/06/2012 1047   CREATININE 0.7 11/24/2012 1424   CREATININE 0.72 02/06/2012 1047   CALCIUM 9.1 11/24/2012 1424   CALCIUM 9.1 02/06/2012 1047   PROT 6.4 11/24/2012 1424   PROT 6.1 11/30/2011 1034   ALBUMIN 3.4* 11/24/2012 1424   ALBUMIN 4.0 11/30/2011 1034   AST 15 11/24/2012 1424    AST 19 11/30/2011 1034   ALT 14 11/24/2012 1424   ALT 16 11/30/2011 1034   ALKPHOS 61 11/24/2012 1424   ALKPHOS 50 11/30/2011 1034   BILITOT 0.71 11/24/2012 1424   BILITOT 0.4 11/30/2011 1034   GFRNONAA 78* 02/06/2012 1047   GFRAA >90 02/06/2012 1047     STUDIES: Patient Name: [name], [name] Accession #: [id number] DOB: 1929/03/19 Age: 79 Gender: F Client Name [facility] Collected Date: 11/10/2012 Received Date: 11/10/2012 Physician: Art Hoss Chart #: MRN # : [id number] Physician cc: Ruthann Cancer, MD Race:W Visit #: [id number].[place] BONE MARROW REPORT FINAL DIAGNOSIS Diagnosis Bone Marrow, Aspirate and Biopsy - HYPERCELLULAR BONE MARROW FOR AGE WITH PERSISTENT LEUKEMIA. - SEE Conner. PERIPHERAL BLOOD: - NORMOCYTIC-NORMOCHROMIC ANEMIA. - LEUKOPENIA WITH CIRCULATING BLASTS. Diagnosis Note The bone marrow shows striking left shifted erythroid proliferation associated with atypical megakaryocytic proliferation and slight increased in myeloblastic cells as see by morphology and flow cytometric studies (generally less than 10%). The findings are similar to previous bone marrow ([id number]) and consistent with the persistence of the leukemia process. Correlation with cytogenetic studies is recommended. (BNS:gt, 12-07-12) Guerry Bruin MD Pathologist, Electronic Signature (Case signed 12-07-2012) GROSS AND MICROSCOPIC INFORMATION Specimen Clinical Information re-evaluate response to leukemia therapy (kp) Source Bone Marrow, Aspirate and Biopsy Microscopic LAB DATA: CBC performed on 11/10/2012 shows: WBC 1.2 K/ul Neutrophils 17% HB 8.3 g/dl Lymphocytes 69% 1 of 3 FINAL  for [name], [name] [id number]) Microscopic(continued) HCT 23.9 % Monocytes 2% MCV 92.3 fL Eosinophils 3% RDW 14.8 % Basophils 0% PLT 381 K/ul Blasts 6% PERIPHERAL BLOOD SMEAR: The red blood cells display mild anisocytosis with macrocytic and normocytic cells. There is mild poikilocytosis with elliptocytes  and teardrop cells. The white blood cells are decreased in number with scattered medium to large blastic cells with high nuclear cytoplasmic ratio, fine chromatin and small to inconspicuous nucleoli. Auer rods are not identified. Scattered myelocytes are also seen on scan. The maturing neutrophilic cells are decreased in number with toxic granulation. The platelets are abundant. BONE MARROW ASPIRATE: Erythroid precursors: Prominently increased in number with left shifted maturation. Maturing cells display nuclear cytoplasmic dyssynchrony. Granulocytic precursors: Markedly decreased in number. The blastic cells are estimated at 6% of all cells and consist of medium to large sized cells with high nuclear cytoplasmic ratio, fine chromatin and small to inconspicuous nucleoli. No Auer rods are identified. Markedly decreased maturing neutrophilic cells display hypogranularity. Megakaryocytes: Increased in number with mostly small hypolobated/unilobated forms or small forms with separate nuclear lobes. Lymphocytes/plasma cells: Large aggregates not present. TOUCH PREPARATIONS: Predominance of erythroid precursors. CLOT and BIOPSY: The sections show 50 to 70% with prominent increase in immature mononuclear cells throughout the bone marrow likely correlating with the abundant erythroid component seen in the aspirate. Megakaryocytes are also abundant with many small abnormal forms. Maturing neutrophilic cells are decreased in number. IRON STAIN: Iron stains are performed on a bone marrow aspirate smear and section of clot. The controls stained appropriately. Storage Iron: Increased. Ringed Sideroblasts: Abundant. ADDITIONAL DATA / TESTING: Specimen was sent for cytogenetic analysis and a separate report will follow. Flow cytometric analysis shows a slight increase in myeloblastic cells ([id number]). Specimen Table Bone Marrow count performed on 500 cells shows: Blasts: 6% Myeloid 9% Promyelocyts:  0% Myelocytes: 1% Erythroid 71% Metamyelocyts: 0% Bands: 1% Lymphocytes: 18% Neutrophils: 0% Eosinophils: 1% Plasma Cells: 2% Basophils: 0% Monocytes: 0% M:E ratio: 0.1 2 of 3 FINAL for HA   ASSESSMENT: 79 y.o.  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   (1) started azacytidine 01/14/2012, stopped after 10 cycles (last dose 10/24/2012) because of worsening neutropenia and increasing transfusion requirements with bone marrow biopsy showing continuing hyper cellularity and slight increase in blast count (to 6%).  (2) anemia, s/p transfusion 8 units PRBCs (02/04/2012, 02/28/2012, 10/01/2012 and 11/07/2012)  (3) neutropenia--progressive, afebrile  (4) thrombocytosis--now normalized  (5)  Started Dacogen (decitabine), given IV on days 1-5 of each 28 day cycle, first dose given on 11/24/2012.   PLAN:  I reviewed [name]'s case with Dr. Darnelle Catalan. She'll initiate her second cycle of  Dacogen (decitabine) today as planned. We will continue to check labs on a weekly basis, and she'll return in 2 weeks for routine followup with Dr. Darnelle Catalan on June 16.    [name]'s next cycle would be due the week of July 4, so we are delaying her next cycle by one week, July 7 through July 11.  [name] voices understanding and agreement with this plan, and call with any changes prior to her  next appointment.  Of course she continues on Levaquin and knows to call us at any time she has a fever of 100 or above.   Renaud Celli    12/22/2012

## 2012-12-22 NOTE — Telephone Encounter (Signed)
Per staff message and POF I have scheduled appts.  JMW  

## 2012-12-22 NOTE — Progress Notes (Signed)
Per Zollie Scale, NP note- ok to treat despite counts.

## 2012-12-23 ENCOUNTER — Ambulatory Visit (HOSPITAL_BASED_OUTPATIENT_CLINIC_OR_DEPARTMENT_OTHER): Payer: Medicare Other

## 2012-12-23 ENCOUNTER — Telehealth: Payer: Self-pay | Admitting: *Deleted

## 2012-12-23 VITALS — BP 137/56 | HR 79 | Temp 97.7°F | Resp 20

## 2012-12-23 DIAGNOSIS — I629 Nontraumatic intracranial hemorrhage, unspecified: Secondary | ICD-10-CM

## 2012-12-23 DIAGNOSIS — C9400 Acute erythroid leukemia, not having achieved remission: Secondary | ICD-10-CM

## 2012-12-23 DIAGNOSIS — Z5111 Encounter for antineoplastic chemotherapy: Secondary | ICD-10-CM

## 2012-12-23 DIAGNOSIS — D473 Essential (hemorrhagic) thrombocythemia: Secondary | ICD-10-CM

## 2012-12-23 DIAGNOSIS — D649 Anemia, unspecified: Secondary | ICD-10-CM

## 2012-12-23 DIAGNOSIS — D72819 Decreased white blood cell count, unspecified: Secondary | ICD-10-CM

## 2012-12-23 MED ORDER — HEPARIN SOD (PORK) LOCK FLUSH 100 UNIT/ML IV SOLN
500.0000 [IU] | Freq: Once | INTRAVENOUS | Status: AC | PRN
  Administered 2012-12-23: 500 [IU]
  Filled 2012-12-23: qty 5

## 2012-12-23 MED ORDER — SODIUM CHLORIDE 0.9 % IV SOLN
20.0000 mg/m2 | Freq: Once | INTRAVENOUS | Status: AC
  Administered 2012-12-23: 35 mg via INTRAVENOUS
  Filled 2012-12-23: qty 7

## 2012-12-23 MED ORDER — DEXAMETHASONE SODIUM PHOSPHATE 10 MG/ML IJ SOLN
10.0000 mg | Freq: Once | INTRAMUSCULAR | Status: AC
  Administered 2012-12-23: 10 mg via INTRAVENOUS

## 2012-12-23 MED ORDER — ONDANSETRON 8 MG/50ML IVPB (CHCC)
8.0000 mg | Freq: Once | INTRAVENOUS | Status: AC
  Administered 2012-12-23: 8 mg via INTRAVENOUS

## 2012-12-23 MED ORDER — SODIUM CHLORIDE 0.9 % IJ SOLN
10.0000 mL | INTRAMUSCULAR | Status: DC | PRN
  Administered 2012-12-23: 10 mL
  Filled 2012-12-23: qty 10

## 2012-12-23 MED ORDER — SODIUM CHLORIDE 0.9 % IV SOLN
Freq: Once | INTRAVENOUS | Status: AC
  Administered 2012-12-23: 11:00:00 via INTRAVENOUS

## 2012-12-23 NOTE — Patient Instructions (Signed)
[facility] Discharge Instructions for Patients Receiving Chemotherapy  Today you received the following chemotherapy agents :  Dacogen.  To help prevent nausea and vomiting after your treatment, we encourage you to take your nausea medication as instructed by your physician.    If you develop nausea and vomiting that is not controlled by your nausea medication, call the clinic. If it is after clinic hours your family physician or the after hours number for the clinic or go to the Emergency Department.   BELOW ARE SYMPTOMS THAT SHOULD BE REPORTED IMMEDIATELY:  *FEVER GREATER THAN 100.5 F  *CHILLS WITH OR WITHOUT FEVER  NAUSEA AND VOMITING THAT IS NOT CONTROLLED WITH YOUR NAUSEA MEDICATION  *UNUSUAL SHORTNESS OF BREATH  *UNUSUAL BRUISING OR BLEEDING  TENDERNESS IN MOUTH AND THROAT WITH OR WITHOUT PRESENCE OF ULCERS  *URINARY PROBLEMS  *BOWEL PROBLEMS  UNUSUAL RASH Items with * indicate a potential emergency and should be followed up as soon as possible.  One of the nurses will contact you 24 hours after your treatment. Please let the nurse know about any problems that you may have experienced. Feel free to call the clinic you have any questions or concerns. The clinic phone number is [phone].   I have been informed and understand all the instructions given to me. I know to contact the clinic, my physician, or go to the Emergency Department if any problems should occur. I do not have any questions at this time, but understand that I may call the clinic during office hours   should I have any questions or need assistance in obtaining follow up care.    __________________________________________  _____________  __________ Signature of Patient or Authorized Representative            Date                   Time    __________________________________________ Nurse's Signature    

## 2012-12-23 NOTE — Telephone Encounter (Signed)
Lm informing the pt that her appts for 6/30, 7/1, 7/2, and her tx appt for 7/3 was all cancel. gv appt d/t for 01/22/13. Also made pt aware of her tx appts that MW will be adding. i emailed MW to add tx. Made pt aware that she will get a AVS of these appts on her next ov...td

## 2012-12-24 ENCOUNTER — Ambulatory Visit: Payer: Medicare Other

## 2012-12-24 ENCOUNTER — Telehealth: Payer: Self-pay | Admitting: *Deleted

## 2012-12-24 ENCOUNTER — Other Ambulatory Visit (HOSPITAL_COMMUNITY): Payer: Self-pay | Admitting: Oncology

## 2012-12-24 ENCOUNTER — Ambulatory Visit (HOSPITAL_BASED_OUTPATIENT_CLINIC_OR_DEPARTMENT_OTHER): Payer: Medicare Other

## 2012-12-24 ENCOUNTER — Telehealth: Payer: Self-pay | Admitting: Oncology

## 2012-12-24 VITALS — BP 135/60 | HR 80 | Temp 97.6°F

## 2012-12-24 DIAGNOSIS — C9590 Leukemia, unspecified not having achieved remission: Secondary | ICD-10-CM

## 2012-12-24 DIAGNOSIS — D75839 Thrombocytosis, unspecified: Secondary | ICD-10-CM

## 2012-12-24 DIAGNOSIS — D473 Essential (hemorrhagic) thrombocythemia: Secondary | ICD-10-CM

## 2012-12-24 DIAGNOSIS — I629 Nontraumatic intracranial hemorrhage, unspecified: Secondary | ICD-10-CM

## 2012-12-24 DIAGNOSIS — Z5111 Encounter for antineoplastic chemotherapy: Secondary | ICD-10-CM

## 2012-12-24 DIAGNOSIS — D649 Anemia, unspecified: Secondary | ICD-10-CM

## 2012-12-24 DIAGNOSIS — C9400 Acute erythroid leukemia, not having achieved remission: Secondary | ICD-10-CM

## 2012-12-24 DIAGNOSIS — D72819 Decreased white blood cell count, unspecified: Secondary | ICD-10-CM

## 2012-12-24 MED ORDER — SODIUM CHLORIDE 0.9 % IV SOLN
20.0000 mg/m2 | Freq: Once | INTRAVENOUS | Status: AC
  Administered 2012-12-24: 35 mg via INTRAVENOUS
  Filled 2012-12-24: qty 7

## 2012-12-24 MED ORDER — SODIUM CHLORIDE 0.9 % IJ SOLN
10.0000 mL | INTRAMUSCULAR | Status: DC | PRN
  Administered 2012-12-24: 10 mL
  Filled 2012-12-24: qty 10

## 2012-12-24 MED ORDER — ONDANSETRON 8 MG/50ML IVPB (CHCC)
8.0000 mg | Freq: Once | INTRAVENOUS | Status: AC
  Administered 2012-12-24: 8 mg via INTRAVENOUS

## 2012-12-24 MED ORDER — HEPARIN SOD (PORK) LOCK FLUSH 100 UNIT/ML IV SOLN
500.0000 [IU] | Freq: Once | INTRAVENOUS | Status: AC | PRN
  Administered 2012-12-24: 500 [IU]
  Filled 2012-12-24: qty 5

## 2012-12-24 MED ORDER — DEXAMETHASONE SODIUM PHOSPHATE 10 MG/ML IJ SOLN
10.0000 mg | Freq: Once | INTRAMUSCULAR | Status: AC
  Administered 2012-12-24: 10 mg via INTRAVENOUS

## 2012-12-24 MED ORDER — SODIUM CHLORIDE 0.9 % IV SOLN
Freq: Once | INTRAVENOUS | Status: AC
  Administered 2012-12-24: 11:00:00 via INTRAVENOUS

## 2012-12-24 NOTE — Telephone Encounter (Signed)
, °

## 2012-12-24 NOTE — Patient Instructions (Addendum)
[facility] Discharge Instructions for Patients Receiving Chemotherapy  Today you received the following chemotherapy agents dacogen.  To help prevent nausea and vomiting after your treatment, we encourage you to take your nausea medication odansetron.   If you develop nausea and vomiting that is not controlled by your nausea medication, call the clinic.   BELOW ARE SYMPTOMS THAT SHOULD BE REPORTED IMMEDIATELY:  *FEVER GREATER THAN 100.5 F  *CHILLS WITH OR WITHOUT FEVER  NAUSEA AND VOMITING THAT IS NOT CONTROLLED WITH YOUR NAUSEA MEDICATION  *UNUSUAL SHORTNESS OF BREATH  *UNUSUAL BRUISING OR BLEEDING  TENDERNESS IN MOUTH AND THROAT WITH OR WITHOUT PRESENCE OF ULCERS  *URINARY PROBLEMS  *BOWEL PROBLEMS  UNUSUAL RASH Items with * indicate a potential emergency and should be followed up as soon as possible.  Feel free to call the clinic you have any questions or concerns. The clinic phone number is [phone].

## 2012-12-24 NOTE — Telephone Encounter (Signed)
Per staff message and POF I have scheduled appts.  JMW  

## 2012-12-25 ENCOUNTER — Ambulatory Visit (HOSPITAL_BASED_OUTPATIENT_CLINIC_OR_DEPARTMENT_OTHER): Payer: Medicare Other

## 2012-12-25 ENCOUNTER — Ambulatory Visit: Payer: Medicare Other

## 2012-12-25 VITALS — BP 136/62 | HR 74 | Temp 97.9°F | Resp 16

## 2012-12-25 DIAGNOSIS — C9400 Acute erythroid leukemia, not having achieved remission: Secondary | ICD-10-CM

## 2012-12-25 DIAGNOSIS — D473 Essential (hemorrhagic) thrombocythemia: Secondary | ICD-10-CM

## 2012-12-25 DIAGNOSIS — D649 Anemia, unspecified: Secondary | ICD-10-CM

## 2012-12-25 DIAGNOSIS — D75839 Thrombocytosis, unspecified: Secondary | ICD-10-CM

## 2012-12-25 DIAGNOSIS — D72819 Decreased white blood cell count, unspecified: Secondary | ICD-10-CM

## 2012-12-25 DIAGNOSIS — Z5111 Encounter for antineoplastic chemotherapy: Secondary | ICD-10-CM

## 2012-12-25 DIAGNOSIS — I629 Nontraumatic intracranial hemorrhage, unspecified: Secondary | ICD-10-CM

## 2012-12-25 MED ORDER — HEPARIN SOD (PORK) LOCK FLUSH 100 UNIT/ML IV SOLN
500.0000 [IU] | Freq: Once | INTRAVENOUS | Status: AC | PRN
  Administered 2012-12-25: 500 [IU]
  Filled 2012-12-25: qty 5

## 2012-12-25 MED ORDER — ONDANSETRON 8 MG/50ML IVPB (CHCC)
8.0000 mg | Freq: Once | INTRAVENOUS | Status: AC
  Administered 2012-12-25: 8 mg via INTRAVENOUS

## 2012-12-25 MED ORDER — SODIUM CHLORIDE 0.9 % IJ SOLN
10.0000 mL | INTRAMUSCULAR | Status: DC | PRN
  Administered 2012-12-25: 10 mL
  Filled 2012-12-25: qty 10

## 2012-12-25 MED ORDER — SODIUM CHLORIDE 0.9 % IV SOLN
Freq: Once | INTRAVENOUS | Status: AC
  Administered 2012-12-25: 10:00:00 via INTRAVENOUS

## 2012-12-25 MED ORDER — DEXAMETHASONE SODIUM PHOSPHATE 10 MG/ML IJ SOLN
10.0000 mg | Freq: Once | INTRAMUSCULAR | Status: AC
  Administered 2012-12-25: 10 mg via INTRAVENOUS

## 2012-12-25 MED ORDER — SODIUM CHLORIDE 0.9 % IV SOLN
20.0000 mg/m2 | Freq: Once | INTRAVENOUS | Status: AC
  Administered 2012-12-25: 35 mg via INTRAVENOUS
  Filled 2012-12-25: qty 7

## 2012-12-25 NOTE — Patient Instructions (Addendum)
[facility] Discharge Instructions for Patients Receiving Chemotherapy  Today you received the following chemotherapy agents Dacogen  To help prevent nausea and vomiting after your treatment, we encourage you to take your nausea medication    If you develop nausea and vomiting that is not controlled by your nausea medication, call the clinic.   BELOW ARE SYMPTOMS THAT SHOULD BE REPORTED IMMEDIATELY:  *FEVER GREATER THAN 100.5 F  *CHILLS WITH OR WITHOUT FEVER  NAUSEA AND VOMITING THAT IS NOT CONTROLLED WITH YOUR NAUSEA MEDICATION  *UNUSUAL SHORTNESS OF BREATH  *UNUSUAL BRUISING OR BLEEDING  TENDERNESS IN MOUTH AND THROAT WITH OR WITHOUT PRESENCE OF ULCERS  *URINARY PROBLEMS  *BOWEL PROBLEMS  UNUSUAL RASH Items with * indicate a potential emergency and should be followed up as soon as possible.  Feel free to call the clinic you have any questions or concerns. The clinic phone number is [phone].    

## 2012-12-26 ENCOUNTER — Ambulatory Visit (HOSPITAL_BASED_OUTPATIENT_CLINIC_OR_DEPARTMENT_OTHER): Payer: Medicare Other

## 2012-12-26 ENCOUNTER — Ambulatory Visit: Payer: Medicare Other

## 2012-12-26 VITALS — BP 134/76 | HR 66 | Temp 98.4°F | Resp 20

## 2012-12-26 DIAGNOSIS — D473 Essential (hemorrhagic) thrombocythemia: Secondary | ICD-10-CM

## 2012-12-26 DIAGNOSIS — Z5111 Encounter for antineoplastic chemotherapy: Secondary | ICD-10-CM

## 2012-12-26 DIAGNOSIS — I629 Nontraumatic intracranial hemorrhage, unspecified: Secondary | ICD-10-CM

## 2012-12-26 DIAGNOSIS — D649 Anemia, unspecified: Secondary | ICD-10-CM

## 2012-12-26 DIAGNOSIS — D72819 Decreased white blood cell count, unspecified: Secondary | ICD-10-CM

## 2012-12-26 DIAGNOSIS — C9400 Acute erythroid leukemia, not having achieved remission: Secondary | ICD-10-CM

## 2012-12-26 MED ORDER — DEXAMETHASONE SODIUM PHOSPHATE 10 MG/ML IJ SOLN
10.0000 mg | Freq: Once | INTRAMUSCULAR | Status: AC
  Administered 2012-12-26: 10 mg via INTRAVENOUS

## 2012-12-26 MED ORDER — SODIUM CHLORIDE 0.9 % IJ SOLN
10.0000 mL | INTRAMUSCULAR | Status: DC | PRN
  Administered 2012-12-26: 10 mL
  Filled 2012-12-26: qty 10

## 2012-12-26 MED ORDER — HEPARIN SOD (PORK) LOCK FLUSH 100 UNIT/ML IV SOLN
500.0000 [IU] | Freq: Once | INTRAVENOUS | Status: AC | PRN
  Administered 2012-12-26: 500 [IU]
  Filled 2012-12-26: qty 5

## 2012-12-26 MED ORDER — SODIUM CHLORIDE 0.9 % IV SOLN
Freq: Once | INTRAVENOUS | Status: AC
  Administered 2012-12-26: 10:00:00 via INTRAVENOUS

## 2012-12-26 MED ORDER — ONDANSETRON 8 MG/50ML IVPB (CHCC)
8.0000 mg | Freq: Once | INTRAVENOUS | Status: AC
  Administered 2012-12-26: 8 mg via INTRAVENOUS

## 2012-12-26 MED ORDER — SODIUM CHLORIDE 0.9 % IV SOLN
20.0000 mg/m2 | Freq: Once | INTRAVENOUS | Status: AC
  Administered 2012-12-26: 35 mg via INTRAVENOUS
  Filled 2012-12-26: qty 7

## 2012-12-26 NOTE — Patient Instructions (Addendum)
[facility] Discharge Instructions for Patients Receiving Chemotherapy  Today you received the following chemotherapy agents Dacogen  To help prevent nausea and vomiting after your treatment, we encourage you to take your nausea medication as prescribed.   If you develop nausea and vomiting that is not controlled by your nausea medication, call the clinic.   BELOW ARE SYMPTOMS THAT SHOULD BE REPORTED IMMEDIATELY:  *FEVER GREATER THAN 100.5 F  *CHILLS WITH OR WITHOUT FEVER  NAUSEA AND VOMITING THAT IS NOT CONTROLLED WITH YOUR NAUSEA MEDICATION  *UNUSUAL SHORTNESS OF BREATH  *UNUSUAL BRUISING OR BLEEDING  TENDERNESS IN MOUTH AND THROAT WITH OR WITHOUT PRESENCE OF ULCERS  *URINARY PROBLEMS  *BOWEL PROBLEMS  UNUSUAL RASH Items with * indicate a potential emergency and should be followed up as soon as possible.  Feel free to call the clinic you have any questions or concerns. The clinic phone number is [phone].    

## 2012-12-29 ENCOUNTER — Other Ambulatory Visit (HOSPITAL_BASED_OUTPATIENT_CLINIC_OR_DEPARTMENT_OTHER): Payer: Medicare Other | Admitting: Lab

## 2012-12-29 ENCOUNTER — Other Ambulatory Visit: Payer: Self-pay | Admitting: *Deleted

## 2012-12-29 ENCOUNTER — Encounter (HOSPITAL_COMMUNITY)
Admission: RE | Admit: 2012-12-29 | Discharge: 2012-12-29 | Disposition: A | Discharge status: Home / Self Care | Payer: Medicare Other | Source: Ambulatory Visit | Attending: Oncology | Admitting: Oncology

## 2012-12-29 DIAGNOSIS — C9400 Acute erythroid leukemia, not having achieved remission: Secondary | ICD-10-CM

## 2012-12-29 DIAGNOSIS — C9590 Leukemia, unspecified not having achieved remission: Secondary | ICD-10-CM

## 2012-12-29 LAB — CBC WITH DIFFERENTIAL/PLATELET
Eosinophils Absolute: 0 10*3/uL (ref 0.0–0.5)
MONO#: 0 10*3/uL — ABNORMAL LOW (ref 0.1–0.9)
NEUT#: 0.2 10*3/uL — CL (ref 1.5–6.5)
RBC: 2.89 10*6/uL — ABNORMAL LOW (ref 3.70–5.45)
RDW: 14.8 % — ABNORMAL HIGH (ref 11.2–14.5)
WBC: 0.9 10*3/uL — CL (ref 3.9–10.3)
nRBC: 0 % (ref 0–0)

## 2012-12-29 LAB — HOLD TUBE, BLOOD BANK

## 2012-12-30 ENCOUNTER — Ambulatory Visit (HOSPITAL_BASED_OUTPATIENT_CLINIC_OR_DEPARTMENT_OTHER): Payer: Medicare Other

## 2012-12-30 VITALS — BP 118/51 | HR 76 | Temp 97.5°F | Resp 20

## 2012-12-30 DIAGNOSIS — C9400 Acute erythroid leukemia, not having achieved remission: Secondary | ICD-10-CM

## 2012-12-30 LAB — PREPARE RBC (CROSSMATCH)

## 2012-12-30 MED ORDER — ACETAMINOPHEN 325 MG PO TABS
650.0000 mg | ORAL_TABLET | Freq: Once | ORAL | Status: AC
  Administered 2012-12-30: 650 mg via ORAL

## 2012-12-30 MED ORDER — SODIUM CHLORIDE 0.9 % IV SOLN
250.0000 mL | Freq: Once | INTRAVENOUS | Status: AC
  Administered 2012-12-30: 250 mL via INTRAVENOUS

## 2012-12-30 MED ORDER — HEPARIN SOD (PORK) LOCK FLUSH 100 UNIT/ML IV SOLN
500.0000 [IU] | Freq: Every day | INTRAVENOUS | Status: AC | PRN
  Administered 2012-12-30: 500 [IU]
  Filled 2012-12-30: qty 5

## 2012-12-30 MED ORDER — DIPHENHYDRAMINE HCL 25 MG PO CAPS
25.0000 mg | ORAL_CAPSULE | Freq: Once | ORAL | Status: AC
  Administered 2012-12-30: 25 mg via ORAL

## 2012-12-30 MED ORDER — SODIUM CHLORIDE 0.9 % IJ SOLN
10.0000 mL | INTRAMUSCULAR | Status: AC | PRN
  Administered 2012-12-30: 10 mL
  Filled 2012-12-30: qty 10

## 2012-12-30 NOTE — Patient Instructions (Signed)
Blood Products Information This is information about transfusions of blood products. All blood that is to be transfused is tested for blood type, compatibility with the recipient, and for infections. Except in emergencies, giving a transfusion requires a written consent. Blood transfusions are often given as packed red blood cells. This means the other parts of the blood have been taken out. Blood may be needed to treat severe anemia or bleeding. Other blood products include plasma, platelets, immune globulin, and cryoprecipitate. Blood for transfusion is mostly donated by volunteers. The blood donors are carefully screened for risk factors that could cause disease. Donors are all tested for infections that could be transmitted by blood. The blood product supply today is the safest it has ever been. Some risks do remain.  A minor reaction with fever, chills, or rash happens in about 1% of blood product transfusions.  Life-threatening reactions occur in less than 1 in a million transfusions.  Infection with germs (bacteria), viruses or parasites like malaria can still happen. The risk is very low.  Hepatitis B occurs in about 1 case in 150,000 transfusions.  Hepatitis C is seen once in 500,000.  HIV is transmitted less than once every million transfusions. When you receive a transfusion of packed red blood cells, your blood is tested for blood group and Rh type. Your blood is also screened for antibodies that could cause a serious reaction. A cross-match test is done to make sure the blood is safe to give.  Talk with your caregiver if you have any concerns about receiving a transfusion of blood products. Make sure your questions are answered. Transfusions are not given if your caregiver feels the risk is greater than the need. Document Released: 07/09/2005 Document Revised: 10/01/2011 Document Reviewed: 12/27/2006 [employer] Patient Information 2014 [employer].  

## 2012-12-31 LAB — TYPE AND SCREEN
Donor AG Type: NEGATIVE
Unit division: 0

## 2013-01-05 ENCOUNTER — Ambulatory Visit (HOSPITAL_BASED_OUTPATIENT_CLINIC_OR_DEPARTMENT_OTHER): Payer: Medicare Other | Admitting: Oncology

## 2013-01-05 ENCOUNTER — Other Ambulatory Visit (HOSPITAL_BASED_OUTPATIENT_CLINIC_OR_DEPARTMENT_OTHER): Payer: Medicare Other

## 2013-01-05 ENCOUNTER — Other Ambulatory Visit: Payer: Medicare Other | Admitting: Lab

## 2013-01-05 VITALS — BP 130/82 | HR 87 | Temp 98.7°F | Resp 20 | Ht 64.0 in | Wt 144.3 lb

## 2013-01-05 DIAGNOSIS — C9400 Acute erythroid leukemia, not having achieved remission: Secondary | ICD-10-CM

## 2013-01-05 DIAGNOSIS — C9590 Leukemia, unspecified not having achieved remission: Secondary | ICD-10-CM

## 2013-01-05 LAB — CBC WITH DIFFERENTIAL/PLATELET
BASO%: 0 % (ref 0.0–2.0)
EOS%: 0 % (ref 0.0–7.0)
LYMPH%: 90.9 % — ABNORMAL HIGH (ref 14.0–49.7)
MCHC: 33.1 g/dL (ref 31.5–36.0)
MCV: 85.9 fL (ref 79.5–101.0)
MONO%: 7.6 % (ref 0.0–14.0)
Platelets: 94 10*3/uL — ABNORMAL LOW (ref 145–400)
RBC: 3.27 10*6/uL — ABNORMAL LOW (ref 3.70–5.45)
nRBC: 0 % (ref 0–0)

## 2013-01-05 NOTE — Progress Notes (Signed)
ID: [name]   DOB: [birth year]  MR#: [id number]  CSN#:[id number]  HISTORY OF PRESENT ILLNESS: The patient sees Dr. [name] for routine health maintenance. On March 2013 he noted that the patient's hemoglobin was 8.7 (previously it had been 11.1). The MCV was at 107.1. The platelet count was 660,000. The white cell count was 4.2. He obtained a B12 and folate which were within normal limits at 1744 and 8.4 respectively.[name] He checked a ferritin to evaluate the thrombocytosis, and this was 151. Serum protein electrophoresis did not show an M spike and the urine protein electrophoresis was likewise negative. He referred the patient for further evaluation. 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 She is currently day 15 cycle 2 decitabine. And she continues until her treatment is clinically well, although she has developed significant cytopenias requiring transfusion.    REVIEW OF SYSTEMS: [name] denies bleeding or fevers. She was fatigued before her blood transfusion June 10, which was her sixth (12 units altogether so far). She denies unusual headaches, visual changes, dizziness, gait imbalance, nausea, vomiting, rash, cough, phlegm production, pleurisy, chest pain or pressure, or any change in bowel or bladder habits. She maintains an excellent functional status  PAST MEDICAL HISTORY: Past Medical History  Diagnosis Date  . Pacemaker     left shoulder  . Cataract     had surgery  . GERD (gastroesophageal reflux disease)   . Glaucoma   . Hyperlipidemia   . Osteoporosis   . Thyroid disease   . PUD (peptic ulcer disease)   . Lichen sclerosus et atrophicus of the vulva   . Intracranial hemorrhage   . Leukemia     PAST SURGICAL HISTORY: Past Surgical History  Procedure Laterality Date  . Cataract extraction w/ intraocular lens  implant, bilateral    . Dilation and curettage of uterus    .  Tonsillectomy and adenoidectomy    . Pacemaker insertion      left shoulder  . Subdural hematoma evacuation via craniotomy  Dec 27, 2003    after a fall  . Colonoscopy      FAMILY HISTORY Family History  Problem Relation Age of Onset  . Colon cancer Mother    the patient's father died at the age of 77 from a myocardial infarction. Her mother was diagnosed with colon cancer at the age of 77 and died from that disease within a year. The patient had 2 sisters. One died at the age of 16 with a neck fracture. The patient has 2 brothers. There is no other history of cancer or hematologic problems in the family to her knowledge  GYNECOLOGIC HISTORY: She does not recall when she had menarche. She is GX P1, first live birth age 31. She went through the change of life in her 84s. She never took hormone replacement.  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 Her husband died in 1995/12/27 from a heart attack. The patient lives alone, with no pets. Her daughter, [name] lives in [place]. She has a PhD in Water quality scientist and works for [employer]. Her phone number is [phone].The patient's brother [name] lives next door and in case of an emergency she would like [name] to call him first. His phone number is [phone]   ADVANCED DIRECTIVES: in place  HEALTH MAINTENANCE: History  Substance Use Topics  . Smoking status:  Never Smoker   . Smokeless tobacco: Never Used  . Alcohol Use: No     Colonoscopy: [facility]/ May 2012  PAP: "no longer"  Bone density: osteoporosis   Lipid panel: [name]             Mammogram: Nov 2012  Allergies  Allergen Reactions  . Sulfa Antibiotics     Pt. States she may have had a reaction a long time ago    Current Outpatient Prescriptions  Medication Sig Dispense Refill  . alendronate (FOSAMAX) 70 MG tablet Take 70 mg by mouth every Saturday. Take with a full glass of water on an empty stomach.  Takes on Saturdays      . amoxicillin (AMOXIL) 500 MG capsule       . aspirin 325 MG EC tablet Take 325 mg by mouth every morning.       . bimatoprost (LUMIGAN) 0.03 % ophthalmic solution Place 1 drop into both eyes at bedtime.        . brinzolamide (AZOPT) 1 % ophthalmic suspension Place 1 drop into both eyes 2 (two) times daily.        . Calcium Carbonate-Vitamin D (CALTRATE 600+D PO) Take 1 tablet by mouth 2 (two) times daily with breakfast and lunch.        . Cholecalciferol (VITAMIN D) 2000 UNITS tablet Take 2,000 Units by mouth 2 (two) times daily.        . Cyanocobalamin (VITAMIN B 12 PO) Take 2,000 mcg by mouth daily.      [name] docusate sodium (COLACE) 100 MG capsule Take 100 mg by mouth daily.      . fluconazole (DIFLUCAN) 100 MG tablet Take 1 tablet (100 mg total) by mouth daily.  30 tablet  12  . Folic Acid 20 MG CAPS Take 40 mg by mouth 2 (two) times daily.      [name] levofloxacin (LEVAQUIN) 500 MG tablet Take 1 tablet (500 mg total) by mouth daily.  30 tablet  12  . levothyroxine (SYNTHROID, LEVOTHROID) 88 MCG tablet Take 88 mcg by mouth every morning.       . lidocaine-prilocaine (EMLA) cream Apply topically as needed.  30 g  2  . MAGNESIUM-ZINC PO Take 1 tablet by mouth daily.      . Multiple Vitamins-Minerals (MACULAR VITAMIN BENEFIT PO) Take 2 tablets by mouth daily.      [name] nystatin cream (MYCOSTATIN) Apply 1 application topically daily as needed for dry skin.       [name] OMEGA-3 KRILL OIL 300 MG CAPS Take 1 capsule by mouth daily.        [name] omeprazole (PRILOSEC) 20 MG capsule Take 20 mg by mouth daily.        . pravastatin (PRAVACHOL) 40 MG tablet Take 40 mg by mouth every evening.        No current facility-administered medications for this visit.    OBJECTIVE: Elderly white woman who  looks remarkably  well Filed Vitals:   01/05/13 1135  BP: 130/82  Pulse: 87  Temp: 98.7 F (37.1 C)  Resp: 20     Body mass index is 24.76 kg/(m^2).    ECOG FS: 1  Filed Weights   01/05/13  1135  Weight: 144 lb 4.8 oz (65.454 kg)   Sclerae unicteric Oropharynx clear No cervical or supraclavicular adenopathy Lungs no rales or rhonchi Heart regular rate and rhythm Abd benign MSK no focal spinal tenderness, no peripheral edema Neuro: nonfocal, well oriented, friendly affect  Breasts: Deferred     LAB RESULTS:   % blasts (date)     By flow On marrow  12/11/2011  22%  5-15%  03/19/2012   --  5%  06/12/2012   --  <5%  09/10/2012  5%  5%  11/03/2012  8%  6%    Lab Results  Component Value Date   WBC 0.7* 01/05/2013   NEUTROABS 0.0* 01/05/2013   HGB 9.3* 01/05/2013   HCT 28.1* 01/05/2013   MCV 85.9 01/05/2013   PLT 94 confirmed both analyzers* 01/05/2013    CMP     Component Value Date/Time   NA 141 11/24/2012 1424   NA 141 02/06/2012 1047   K 4.0 11/24/2012 1424   K 3.9 02/06/2012 1047   CL 108* 11/24/2012 1424   CL 105 02/06/2012 1047   CO2 27 11/24/2012 1424   CO2 27 02/06/2012 1047   GLUCOSE 102* 11/24/2012 1424   GLUCOSE 92 02/06/2012 1047   BUN 24.6 11/24/2012 1424   BUN 20 02/06/2012 1047   CREATININE 0.7 11/24/2012 1424   CREATININE 0.72 02/06/2012 1047   CALCIUM 9.1 11/24/2012 1424   CALCIUM 9.1 02/06/2012 1047   PROT 6.4 11/24/2012 1424   PROT 6.1 11/30/2011 1034   ALBUMIN 3.4* 11/24/2012 1424   ALBUMIN 4.0 11/30/2011 1034   AST 15 11/24/2012 1424   AST 19 11/30/2011 1034   ALT 14 11/24/2012 1424   ALT 16 11/30/2011 1034   ALKPHOS 61 11/24/2012 1424   ALKPHOS 50 11/30/2011 1034   BILITOT 0.71 11/24/2012 1424   BILITOT 0.4 11/30/2011 1034   GFRNONAA 78* 02/06/2012 1047   GFRAA >90 02/06/2012 1047     STUDIES: Patient Name: FREDI, HURTADO Accession #: [id number] DOB: 1929-02-20 Age: 22 Gender: F Client [facility] Collected Date: 11/10/2012 Received Date: 11/10/2012 Physician: Art Hoss Chart #: MRN # : [id number] Physician cc: Ruthann Cancer, MD Race:W Visit #: [id number].[place] BONE MARROW REPORT FINAL DIAGNOSIS Diagnosis Bone Marrow, Aspirate and  Biopsy - HYPERCELLULAR BONE MARROW FOR AGE WITH PERSISTENT LEUKEMIA. - SEE Conner. PERIPHERAL BLOOD: - NORMOCYTIC-NORMOCHROMIC ANEMIA. - LEUKOPENIA WITH CIRCULATING BLASTS. Diagnosis Note The bone marrow shows striking left shifted erythroid proliferation associated with atypical megakaryocytic proliferation and slight increased in myeloblastic cells as see by morphology and flow cytometric studies (generally less than 10%). The findings are similar to previous bone marrow ([id number]) and consistent with the persistence of the leukemia process. Correlation with cytogenetic studies is recommended. (BNS:gt, 11-26-12) Guerry Bruin MD Pathologist, Electronic Signature (Case signed 2012-11-26) GROSS AND MICROSCOPIC INFORMATION Specimen Clinical Information re-evaluate response to leukemia therapy (kp) Source Bone Marrow, Aspirate and Biopsy Microscopic LAB DATA: CBC performed on 11/10/2012 shows: WBC 1.2 K/ul Neutrophils 17% HB 8.3 g/dl Lymphocytes 13% 1 of 3 FINAL for Wartman, Sukhmani J ([id number]) Microscopic(continued) HCT 23.9 % Monocytes 2% MCV 92.3 fL Eosinophils 3% RDW 14.8 % Basophils 0% PLT 381 K/ul Blasts 6% PERIPHERAL BLOOD SMEAR: The red blood cells display mild anisocytosis with macrocytic and normocytic cells. There is mild poikilocytosis with elliptocytes and teardrop cells. The white blood cells are decreased in number with scattered medium to large blastic cells with high nuclear cytoplasmic ratio, fine chromatin and small to inconspicuous nucleoli. Auer rods are not identified. Scattered myelocytes are also seen on scan. The maturing neutrophilic cells are decreased in number with toxic granulation. The platelets are abundant. BONE MARROW ASPIRATE: Erythroid precursors: Prominently increased in number with left shifted maturation. Maturing cells display nuclear cytoplasmic  dyssynchrony. Granulocytic precursors: Markedly decreased in number. The blastic cells are  estimated at 6% of all cells and consist of medium to large sized cells with high nuclear cytoplasmic ratio, fine chromatin and small to inconspicuous nucleoli. No Auer rods are identified. Markedly decreased maturing neutrophilic cells display hypogranularity. Megakaryocytes: Increased in number with mostly small hypolobated/unilobated forms or small forms with separate nuclear lobes. Lymphocytes/plasma cells: Large aggregates not present. TOUCH PREPARATIONS: Predominance of erythroid precursors. CLOT and BIOPSY: The sections show 50 to 70% with prominent increase in immature mononuclear cells throughout the bone marrow likely correlating with the abundant erythroid component seen in the aspirate. Megakaryocytes are also abundant with many small abnormal forms. Maturing neutrophilic cells are decreased in number. IRON STAIN: Iron stains are performed on a bone marrow aspirate smear and section of clot. The controls stained appropriately. Storage Iron: Increased. Ringed Sideroblasts: Abundant. ADDITIONAL DATA / TESTING: Specimen was sent for cytogenetic analysis and a separate report will follow. Flow cytometric analysis shows a slight increase in myeloblastic cells ([id number]). Specimen Table Bone Marrow count performed on 500 cells shows: Blasts: 6% Myeloid 9% Promyelocyts: 0% Myelocytes: 1% Erythroid 71% Metamyelocyts: 0% Bands: 1% Lymphocytes: 18% Neutrophils: 0% Eosinophils: 1% Plasma Cells: 2% Basophils: 0% Monocytes: 0% M:E ratio: 0.1 2 of 3 FINAL for HA   ASSESSMENT: 79 y.o.   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   (1) started azacytidine 01/14/2012, stopped after 10 cycles (last dose 10/24/2012) because of worsening neutropenia and increasing transfusion requirements with bone marrow biopsy showing continuing hypercellularity and slight increase in blast count (to 6%).  (2) anemia, s/p transfusion 8 units PRBCs (02/04/2012, 02/28/2012, 10/01/2012 and 11/07/2012)  (3) neutropenia--progressive, afebrile; on prophylactic Levaquin and Diflucan  (4) thrombocytosis-- currently thrombocytopenic (for the first time)  (5)  Started Dacogen (decitabine), given IV on days 1-5 of each 28 day cycle, first dose given on 11/24/2012.   PLAN:  Lyrick has tolerated her second cycle of decitabine remarkably well. Her cytopenias, particularly her thrombocytopenia, may be evidence of response, since an elevated platelet count was part of her initial syndrome. Of course it could also be bad news. It may take a bone marrow to sort this out, but as of now the plan has been to proceed to the third cycle of decitabine before remarrowing. She will be seen at [facility] later this week and we will adjust further plans as suggested by their further evaluation. Otherwise Elvenia is scheduled to return here July 7 to return treatment. We will continue to check her labs on a weekly basis and support as needed.  Teandra Harlan C    01/05/2013

## 2013-01-09 ENCOUNTER — Other Ambulatory Visit: Payer: Self-pay | Admitting: Oncology

## 2013-01-12 ENCOUNTER — Other Ambulatory Visit (HOSPITAL_BASED_OUTPATIENT_CLINIC_OR_DEPARTMENT_OTHER): Payer: Medicare Other | Admitting: Lab

## 2013-01-12 DIAGNOSIS — C9400 Acute erythroid leukemia, not having achieved remission: Secondary | ICD-10-CM

## 2013-01-12 DIAGNOSIS — C9590 Leukemia, unspecified not having achieved remission: Secondary | ICD-10-CM

## 2013-01-12 LAB — CBC WITH DIFFERENTIAL/PLATELET
BASO%: 0.8 % (ref 0.0–2.0)
Basophils Absolute: 0 10*3/uL (ref 0.0–0.1)
EOS%: 0.1 % (ref 0.0–7.0)
HGB: 7.8 g/dL — ABNORMAL LOW (ref 11.6–15.9)
MCH: 29.3 pg (ref 25.1–34.0)
RDW: 15 % — ABNORMAL HIGH (ref 11.2–14.5)
lymph#: 0.6 10*3/uL — ABNORMAL LOW (ref 0.9–3.3)

## 2013-01-16 ENCOUNTER — Other Ambulatory Visit: Payer: Self-pay | Admitting: *Deleted

## 2013-01-16 DIAGNOSIS — D649 Anemia, unspecified: Secondary | ICD-10-CM

## 2013-01-16 DIAGNOSIS — C9400 Acute erythroid leukemia, not having achieved remission: Secondary | ICD-10-CM

## 2013-01-16 NOTE — Telephone Encounter (Signed)
sw pt gv a lab appt for 01/21/13@1 :45pm and informed her that she will get a unit of blood @2 :45PM. I also made her aware that after her appt w/ [facility] on 01/22/13 she will get another unit of blood. Pt is aware...td

## 2013-01-16 NOTE — Progress Notes (Signed)
Pt called to this RN to state noted symptoms of fatigue and SOB that she feels she will need a blood transfusion next week.  Order entered for lab and appointment and sent to scheduling.

## 2013-01-19 ENCOUNTER — Other Ambulatory Visit: Payer: Self-pay | Admitting: Cardiovascular Disease

## 2013-01-19 ENCOUNTER — Ambulatory Visit: Payer: Medicare Other | Admitting: Physician Assistant

## 2013-01-19 ENCOUNTER — Ambulatory Visit: Payer: Medicare Other

## 2013-01-19 ENCOUNTER — Other Ambulatory Visit: Payer: Medicare Other | Admitting: Lab

## 2013-01-19 DIAGNOSIS — I442 Atrioventricular block, complete: Secondary | ICD-10-CM

## 2013-01-20 ENCOUNTER — Ambulatory Visit: Payer: Medicare Other

## 2013-01-20 ENCOUNTER — Telehealth: Payer: Self-pay | Admitting: *Deleted

## 2013-01-20 NOTE — Telephone Encounter (Signed)
I called and moved appt for 7/8 down to a later time. Patient aware.  JMW

## 2013-01-21 ENCOUNTER — Ambulatory Visit (HOSPITAL_BASED_OUTPATIENT_CLINIC_OR_DEPARTMENT_OTHER): Payer: Medicare Other

## 2013-01-21 ENCOUNTER — Encounter (HOSPITAL_COMMUNITY)
Admission: RE | Admit: 2013-01-21 | Discharge: 2013-01-21 | Disposition: A | Discharge status: Home / Self Care | Payer: Medicare Other | Source: Ambulatory Visit | Attending: Oncology | Admitting: Oncology

## 2013-01-21 ENCOUNTER — Other Ambulatory Visit: Payer: Self-pay | Admitting: *Deleted

## 2013-01-21 ENCOUNTER — Other Ambulatory Visit: Payer: Self-pay | Admitting: Physician Assistant

## 2013-01-21 ENCOUNTER — Ambulatory Visit: Payer: Medicare Other

## 2013-01-21 ENCOUNTER — Other Ambulatory Visit (HOSPITAL_BASED_OUTPATIENT_CLINIC_OR_DEPARTMENT_OTHER): Payer: Medicare Other | Admitting: Lab

## 2013-01-21 DIAGNOSIS — D649 Anemia, unspecified: Secondary | ICD-10-CM

## 2013-01-21 DIAGNOSIS — D630 Anemia in neoplastic disease: Secondary | ICD-10-CM

## 2013-01-21 DIAGNOSIS — C9590 Leukemia, unspecified not having achieved remission: Secondary | ICD-10-CM

## 2013-01-21 DIAGNOSIS — C9400 Acute erythroid leukemia, not having achieved remission: Secondary | ICD-10-CM

## 2013-01-21 LAB — CBC WITH DIFFERENTIAL/PLATELET
Basophils Absolute: 0 10*3/uL (ref 0.0–0.1)
Eosinophils Absolute: 0 10*3/uL (ref 0.0–0.5)
HGB: 6.7 g/dL — CL (ref 11.6–15.9)
MCV: 85.2 fL (ref 79.5–101.0)
MONO#: 0.2 10*3/uL (ref 0.1–0.9)
NEUT#: 0.3 10*3/uL — CL (ref 1.5–6.5)
RDW: 14.8 % — ABNORMAL HIGH (ref 11.2–14.5)
WBC: 1.1 10*3/uL — ABNORMAL LOW (ref 3.9–10.3)
lymph#: 0.6 10*3/uL — ABNORMAL LOW (ref 0.9–3.3)

## 2013-01-21 LAB — PREPARE RBC (CROSSMATCH)

## 2013-01-21 LAB — PACEMAKER DEVICE OBSERVATION

## 2013-01-21 LAB — HOLD TUBE, BLOOD BANK

## 2013-01-21 LAB — TECHNOLOGIST REVIEW: Technologist Review: 4

## 2013-01-21 MED ORDER — ACETAMINOPHEN 325 MG PO TABS
650.0000 mg | ORAL_TABLET | Freq: Once | ORAL | Status: AC
  Administered 2013-01-21: 650 mg via ORAL

## 2013-01-21 MED ORDER — SODIUM CHLORIDE 0.9 % IJ SOLN
10.0000 mL | INTRAMUSCULAR | Status: DC | PRN
  Filled 2013-01-21: qty 10

## 2013-01-21 MED ORDER — HEPARIN SOD (PORK) LOCK FLUSH 100 UNIT/ML IV SOLN
500.0000 [IU] | Freq: Every day | INTRAVENOUS | Status: DC | PRN
  Filled 2013-01-21: qty 5

## 2013-01-21 MED ORDER — SODIUM CHLORIDE 0.9 % IV SOLN: 250.0000 mL | Freq: Once | INTRAVENOUS | Status: DC

## 2013-01-21 MED ORDER — DIPHENHYDRAMINE HCL 25 MG PO CAPS
25.0000 mg | ORAL_CAPSULE | Freq: Once | ORAL | Status: AC
  Administered 2013-01-21: 25 mg via ORAL

## 2013-01-21 NOTE — Progress Notes (Signed)
1500-PRBC's will not be ready until tomorrow per blood bank.  Pt re-scheduled for both units of PRBC's for tomorrow at noon.  Pt has no questions at this time.  Pt instructed to leave blue blood bank bracelet on for tomorrow's transfusion.

## 2013-01-22 ENCOUNTER — Ambulatory Visit (HOSPITAL_BASED_OUTPATIENT_CLINIC_OR_DEPARTMENT_OTHER): Payer: Medicare Other

## 2013-01-22 ENCOUNTER — Ambulatory Visit: Payer: Medicare Other

## 2013-01-22 ENCOUNTER — Telehealth: Payer: Self-pay | Admitting: *Deleted

## 2013-01-22 ENCOUNTER — Other Ambulatory Visit: Payer: Medicare Other | Admitting: Lab

## 2013-01-22 ENCOUNTER — Ambulatory Visit (HOSPITAL_BASED_OUTPATIENT_CLINIC_OR_DEPARTMENT_OTHER): Payer: Medicare Other | Admitting: Oncology

## 2013-01-22 VITALS — BP 112/62 | HR 83 | Temp 97.1°F | Resp 20

## 2013-01-22 DIAGNOSIS — C9200 Acute myeloblastic leukemia, not having achieved remission: Secondary | ICD-10-CM

## 2013-01-22 DIAGNOSIS — D630 Anemia in neoplastic disease: Secondary | ICD-10-CM

## 2013-01-22 LAB — PREPARE RBC (CROSSMATCH)

## 2013-01-22 MED ORDER — HEPARIN SOD (PORK) LOCK FLUSH 100 UNIT/ML IV SOLN
500.0000 [IU] | Freq: Once | INTRAVENOUS | Status: AC
  Administered 2013-01-22: 500 [IU] via INTRAVENOUS
  Filled 2013-01-22: qty 5

## 2013-01-22 MED ORDER — SODIUM CHLORIDE 0.9 % IV SOLN
250.0000 mL | Freq: Once | INTRAVENOUS | Status: AC
  Administered 2013-01-22: 250 mL via INTRAVENOUS

## 2013-01-22 MED ORDER — DIPHENHYDRAMINE HCL 25 MG PO CAPS
25.0000 mg | ORAL_CAPSULE | Freq: Once | ORAL | Status: AC
  Administered 2013-01-22: 25 mg via ORAL

## 2013-01-22 MED ORDER — ACYCLOVIR 400 MG PO TABS: 400.0000 mg | ORAL_TABLET | Freq: Two times a day (BID) | ORAL | Status: DC

## 2013-01-22 MED ORDER — ACETAMINOPHEN 325 MG PO TABS
650.0000 mg | ORAL_TABLET | Freq: Once | ORAL | Status: AC
Start: 2013-01-22 — End: 2013-01-22
  Administered 2013-01-22: 650 mg via ORAL

## 2013-01-22 MED ORDER — SODIUM CHLORIDE 0.9 % IJ SOLN
10.0000 mL | INTRAMUSCULAR | Status: DC | PRN
  Administered 2013-01-22: 10 mL via INTRAVENOUS
  Filled 2013-01-22: qty 10

## 2013-01-22 NOTE — Patient Instructions (Addendum)
Blood Transfusion Information WHAT IS A BLOOD TRANSFUSION? A transfusion is the replacement of blood or some of its parts. Blood is made up of multiple cells which provide different functions.  Red blood cells carry oxygen and are used for blood loss replacement.  White blood cells fight against infection.  Platelets control bleeding.  Plasma helps clot blood.  Other blood products are available for specialized needs, such as hemophilia or other clotting disorders. BEFORE THE TRANSFUSION  Who gives blood for transfusions?   You may be able to donate blood to be used at a later date on yourself (autologous donation).  Relatives can be asked to donate blood. This is generally not any safer than if you have received blood from a stranger. The same precautions are taken to ensure safety when a relative's blood is donated.  Healthy volunteers who are fully evaluated to make sure their blood is safe. This is blood bank blood. Transfusion therapy is the safest it has ever been in the practice of medicine. Before blood is taken from a donor, a complete history is taken to make sure that person has no history of diseases nor engages in risky social behavior (examples are intravenous drug use or sexual activity with multiple partners). The donor's travel history is screened to minimize risk of transmitting infections, such as malaria. The donated blood is tested for signs of infectious diseases, such as HIV and hepatitis. The blood is then tested to be sure it is compatible with you in order to minimize the chance of a transfusion reaction. If you or a relative donates blood, this is often done in anticipation of surgery and is not appropriate for emergency situations. It takes many days to process the donated blood. RISKS AND COMPLICATIONS Although transfusion therapy is very safe and saves many lives, the main dangers of transfusion include:   Getting an infectious disease.  Developing a  transfusion reaction. This is an allergic reaction to something in the blood you were given. Every precaution is taken to prevent this. The decision to have a blood transfusion has been considered carefully by your caregiver before blood is given. Blood is not given unless the benefits outweigh the risks. AFTER THE TRANSFUSION  Right after receiving a blood transfusion, you will usually feel much better and more energetic. This is especially true if your red blood cells have gotten low (anemic). The transfusion raises the level of the red blood cells which carry oxygen, and this usually causes an energy increase.  The nurse administering the transfusion will monitor you carefully for complications. HOME CARE INSTRUCTIONS  No special instructions are needed after a transfusion. You may find your energy is better. Speak with your caregiver about any limitations on activity for underlying diseases you may have. SEEK MEDICAL CARE IF:   Your condition is not improving after your transfusion.  You develop redness or irritation at the intravenous (IV) site. SEEK IMMEDIATE MEDICAL CARE IF:  Any of the following symptoms occur over the next 12 hours:  Shaking chills.  You have a temperature by mouth above 102 F (38.9 C), not controlled by medicine.  Chest, back, or muscle pain.  People around you feel you are not acting correctly or are confused.  Shortness of breath or difficulty breathing.  Dizziness and fainting.  You get a rash or develop hives.  You have a decrease in urine output.  Your urine turns a dark color or changes to pink, red, or brown. Any of the following   symptoms occur over the next 10 days:  You have a temperature by mouth above 102 F (38.9 C), not controlled by medicine.  Shortness of breath.  Weakness after normal activity.  The white part of the eye turns yellow (jaundice).  You have a decrease in the amount of urine or are urinating less often.  Your  urine turns a dark color or changes to pink, red, or brown. Document Released: 07/06/2000 Document Revised: 10/01/2011 Document Reviewed: 02/23/2008 Exit[employer] Patient Information 2014 [employer].  

## 2013-01-22 NOTE — Progress Notes (Signed)
ID: [name]   DOB: [birth year]  MR#: [id number]  IHK#:[id number]  HISTORY OF PRESENT ILLNESS: The patient sees Dr. [name] for routine health maintenance. On March 2013 he noted that the patient's hemoglobin was 8.7 (previously it had been 11.1). The MCV was at 107.1. The platelet count was 660,000. The white cell count was 4.2. He obtained a B12 and folate which were within normal limits at 1744 and 8.4 respectively.[name] He checked a ferritin to evaluate the thrombocytosis, and this was 151. Serum protein electrophoresis did not show an M spike and the urine protein electrophoresis was likewise negative. He referred the patient for further evaluation. Her subsequent history is as detailed below  INTERVAL HISTORY:  [name] was seen today in the treatment area while receiving a transfusion for her very low hemoglobin. Since the last visit here she visited with Dr. Lowell Guitar again. His main recommendation is to do a bone marrow biopsy after the next cycle of decitabine, with consideration of research protocols if there is evidence of progression   REVIEW OF SYSTEMS: [name] tolerates the chemotherapy without significant problems. She does become quite anemic and we have decided to transfuse for hemoglobin of 8.5 or less. Her legs has felt a little weak so she has not been walking as much as she usually does. She has not had any problems with dizziness, vertigo, nausea, vomiting, visual changes, cough, phlegm production, pleurisy, or change in bowel or bladder habits. She has had some mouth sores. There has been no bleeding rash or fever. A detailed review of systems was otherwise stable.  PAST MEDICAL HISTORY: Past Medical History  Diagnosis Date  . Pacemaker     left shoulder  . Cataract     had surgery  . GERD (gastroesophageal reflux disease)   . Glaucoma   . Hyperlipidemia   . Osteoporosis   . Thyroid disease   . PUD (peptic ulcer disease)   . Lichen sclerosus et atrophicus of the vulva   .  Intracranial hemorrhage   . Leukemia     PAST SURGICAL HISTORY: Past Surgical History  Procedure Laterality Date  . Cataract extraction w/ intraocular lens  implant, bilateral    . Dilation and curettage of uterus    . Tonsillectomy and adenoidectomy    . Pacemaker insertion      left shoulder  . Subdural hematoma evacuation via craniotomy  12-20-03    after a fall  . Colonoscopy      FAMILY HISTORY Family History  Problem Relation Age of Onset  . Colon cancer Mother    the patient's father died at the age of 45 from a myocardial infarction. Her mother was diagnosed with colon cancer at the age of 69 and died from that disease within a year. The patient had 2 sisters. One died at the age of 76 with a neck fracture. The patient has 2 brothers. There is no other history of cancer or hematologic problems in the family to her knowledge  GYNECOLOGIC HISTORY: She does not recall when she had menarche. She is GX P1, first live birth age 75. She went through the change of life in her 20s. She never took hormone replacement.  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 Her husband died in 12/20/1995 from a heart attack. The patient lives alone, with no pets. Her daughter, [name] lives in [place]. She has a PhD in neuropsychology and  works for [employer]. Her phone number is [phone].The patient's brother [name] lives next door and in case of an emergency she would like [name] to call him first. His phone number is [phone]   ADVANCED DIRECTIVES: in place  HEALTH MAINTENANCE: History  Substance Use Topics  . Smoking status: Never Smoker   . Smokeless tobacco: Never Used  . Alcohol Use: No     Colonoscopy: [facility]/ May 2012  PAP: "no longer"  Bone density: osteoporosis   Lipid panel: [name]             Mammogram: Nov 2012  Allergies  Allergen Reactions  . Sulfa Antibiotics     Pt. States she may have had  a reaction a long time ago    Current Outpatient Prescriptions  Medication Sig Dispense Refill  . alendronate (FOSAMAX) 70 MG tablet Take 70 mg by mouth every Saturday. Take with a full glass of water on an empty stomach. Takes on Saturdays      . amoxicillin (AMOXIL) 500 MG capsule       . aspirin 325 MG EC tablet Take 325 mg by mouth every morning.       . bimatoprost (LUMIGAN) 0.03 % ophthalmic solution Place 1 drop into both eyes at bedtime.        . brinzolamide (AZOPT) 1 % ophthalmic suspension Place 1 drop into both eyes 2 (two) times daily.        . Calcium Carbonate-Vitamin D (CALTRATE 600+D PO) Take 1 tablet by mouth 2 (two) times daily with breakfast and lunch.        . Cholecalciferol (VITAMIN D) 2000 UNITS tablet Take 2,000 Units by mouth 2 (two) times daily.        . Cyanocobalamin (VITAMIN B 12 PO) Take 2,000 mcg by mouth daily.      [name] docusate sodium (COLACE) 100 MG capsule Take 100 mg by mouth daily.      . fluconazole (DIFLUCAN) 100 MG tablet Take 1 tablet (100 mg total) by mouth daily.  30 tablet  12  . Folic Acid 20 MG CAPS Take 40 mg by mouth 2 (two) times daily.      [name] levofloxacin (LEVAQUIN) 500 MG tablet Take 1 tablet (500 mg total) by mouth daily.  30 tablet  12  . levothyroxine (SYNTHROID, LEVOTHROID) 88 MCG tablet Take 88 mcg by mouth every morning.       . lidocaine-prilocaine (EMLA) cream Apply topically as needed.  30 g  2  . MAGNESIUM-ZINC PO Take 1 tablet by mouth daily.      . Multiple Vitamins-Minerals (MACULAR VITAMIN BENEFIT PO) Take 2 tablets by mouth daily.      [name] nystatin cream (MYCOSTATIN) Apply 1 application topically daily as needed for dry skin.       [name] OMEGA-3 KRILL OIL 300 MG CAPS Take 1 capsule by mouth daily.        [name] omeprazole (PRILOSEC) 20 MG capsule Take 20 mg by mouth daily.        . pravastatin (PRAVACHOL) 40 MG tablet Take 40 mg by mouth every evening.        No current facility-administered medications for this visit.   Vitals - 1  value per visit 01/22/2013  SYSTOLIC 100  DIASTOLIC 50  PULSE 92  TEMPERATURE 98.8  RESPIRATIONS 24  Weight (lb)   HEIGHT   BMI   VISIT REPORT    OBJECTIVE: Elderly white woman who  looks remarkably  well  Sclerae unicteric Oropharynx minimal ulcers, no thrush Lungs no rales or rhonchi Heart regular rate and rhythm Abd benign MSK no focal spinal tenderness, no peripheral edema Neuro: nonfocal, well oriented, friendly affect Breasts: Deferred   LAB RESULTS:   % blasts (date)     By flow On marrow  12/11/2011  22%  5-15%  03/19/2012   --  5%  06/12/2012   --  <5%  09/10/2012  5%  5%  11/03/2012  8%  6%    Lab Results  Component Value Date   WBC 1.1* 01/21/2013   NEUTROABS 0.3* 01/21/2013   HGB 6.7* 01/21/2013   HCT 20.2* 01/21/2013   MCV 85.2 01/21/2013   PLT 309 01/21/2013    CMP     Component Value Date/Time   NA 141 11/24/2012 1424   NA 141 02/06/2012 1047   K 4.0 11/24/2012 1424   K 3.9 02/06/2012 1047   CL 108* 11/24/2012 1424   CL 105 02/06/2012 1047   CO2 27 11/24/2012 1424   CO2 27 02/06/2012 1047   GLUCOSE 102* 11/24/2012 1424   GLUCOSE 92 02/06/2012 1047   BUN 24.6 11/24/2012 1424   BUN 20 02/06/2012 1047   CREATININE 0.7 11/24/2012 1424   CREATININE 0.72 02/06/2012 1047   CALCIUM 9.1 11/24/2012 1424   CALCIUM 9.1 02/06/2012 1047   PROT 6.4 11/24/2012 1424   PROT 6.1 11/30/2011 1034   ALBUMIN 3.4* 11/24/2012 1424   ALBUMIN 4.0 11/30/2011 1034   AST 15 11/24/2012 1424   AST 19 11/30/2011 1034   ALT 14 11/24/2012 1424   ALT 16 11/30/2011 1034   ALKPHOS 61 11/24/2012 1424   ALKPHOS 50 11/30/2011 1034   BILITOT 0.71 11/24/2012 1424   BILITOT 0.4 11/30/2011 1034   GFRNONAA 78* 02/06/2012 1047   GFRAA >90 02/06/2012 1047     STUDIES: No results found.   ASSESSMENT: 79 y.o.  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   (1) started azacytidine 01/14/2012, stopped after 10 cycles (last dose 10/24/2012) because of worsening neutropenia and increasing transfusion requirements with bone marrow biopsy showing continuing hypercellularity and slight increase in blast count (to 6%).  (2) anemia, s/p transfusion 8 units PRBCs (02/04/2012, 02/28/2012, 10/01/2012 and 11/07/2012)  (3) neutropenia--progressive, afebrile; on prophylactic Levaquin and Diflucan  (4) thrombocytosis-- currently thrombocytopenic (for the first time)  (5)  Started Dacogen (decitabine), given IV on days 1-5 of each 28 day cycle, first dose given on 11/24/2012.   PLAN:  We are going to proceed with the third cycle of decitabine beginning July 7. We are going to check her labwork every week and transfuse for a hemoglobin of 8.5 or less. She will have a bone marrow biopsy 3 weeks from now, before starting a fourth cycle. If there is evidence of progression we will consider a research protocol at any of the tertiary care centers in [place]. Otherwise we will consider lenalidomide. He has a good understanding of this overall plan and is very much in agreement. I am starting her on acyclovir given her mouth ulcers. She is aware of the  need for her to call us for a temperature, or any bleeding or other complications. Londyn Hotard C    01/22/2013

## 2013-01-23 LAB — TYPE AND SCREEN
Antibody Screen: NEGATIVE
Donor AG Type: NEGATIVE
Unit division: 0
Unit division: 0

## 2013-01-26 ENCOUNTER — Other Ambulatory Visit: Payer: Self-pay | Admitting: *Deleted

## 2013-01-26 ENCOUNTER — Other Ambulatory Visit (HOSPITAL_BASED_OUTPATIENT_CLINIC_OR_DEPARTMENT_OTHER): Payer: Medicare Other

## 2013-01-26 ENCOUNTER — Ambulatory Visit (HOSPITAL_BASED_OUTPATIENT_CLINIC_OR_DEPARTMENT_OTHER): Payer: Medicare Other

## 2013-01-26 ENCOUNTER — Telehealth: Payer: Self-pay | Admitting: Oncology

## 2013-01-26 VITALS — BP 115/75 | HR 85 | Temp 98.0°F

## 2013-01-26 DIAGNOSIS — C9400 Acute erythroid leukemia, not having achieved remission: Secondary | ICD-10-CM

## 2013-01-26 DIAGNOSIS — D649 Anemia, unspecified: Secondary | ICD-10-CM

## 2013-01-26 DIAGNOSIS — I629 Nontraumatic intracranial hemorrhage, unspecified: Secondary | ICD-10-CM

## 2013-01-26 DIAGNOSIS — D72819 Decreased white blood cell count, unspecified: Secondary | ICD-10-CM

## 2013-01-26 DIAGNOSIS — Z5111 Encounter for antineoplastic chemotherapy: Secondary | ICD-10-CM

## 2013-01-26 DIAGNOSIS — D473 Essential (hemorrhagic) thrombocythemia: Secondary | ICD-10-CM

## 2013-01-26 LAB — TECHNOLOGIST REVIEW

## 2013-01-26 LAB — CBC WITH DIFFERENTIAL/PLATELET
BASO%: 0 % (ref 0.0–2.0)
Eosinophils Absolute: 0 10*3/uL (ref 0.0–0.5)
MCHC: 32.9 g/dL (ref 31.5–36.0)
MONO#: 0.1 10*3/uL (ref 0.1–0.9)
NEUT#: 0.2 10*3/uL — CL (ref 1.5–6.5)
RBC: 2.71 10*6/uL — ABNORMAL LOW (ref 3.70–5.45)
RDW: 15.1 % — ABNORMAL HIGH (ref 11.2–14.5)
WBC: 0.9 10*3/uL — CL (ref 3.9–10.3)
nRBC: 0 % (ref 0–0)

## 2013-01-26 LAB — PREPARE RBC (CROSSMATCH)

## 2013-01-26 LAB — HOLD TUBE, BLOOD BANK

## 2013-01-26 MED ORDER — DIPHENHYDRAMINE HCL 25 MG PO CAPS
25.0000 mg | ORAL_CAPSULE | Freq: Once | ORAL | Status: AC
  Administered 2013-01-26: 25 mg via ORAL

## 2013-01-26 MED ORDER — HEPARIN SOD (PORK) LOCK FLUSH 100 UNIT/ML IV SOLN
500.0000 [IU] | Freq: Once | INTRAVENOUS | Status: AC | PRN
  Administered 2013-01-26: 500 [IU]
  Filled 2013-01-26: qty 5

## 2013-01-26 MED ORDER — SODIUM CHLORIDE 0.9 % IJ SOLN
10.0000 mL | INTRAMUSCULAR | Status: DC | PRN
  Administered 2013-01-26: 10 mL
  Filled 2013-01-26: qty 10

## 2013-01-26 MED ORDER — DEXAMETHASONE SODIUM PHOSPHATE 10 MG/ML IJ SOLN
10.0000 mg | Freq: Once | INTRAMUSCULAR | Status: AC
  Administered 2013-01-26: 10 mg via INTRAVENOUS

## 2013-01-26 MED ORDER — SODIUM CHLORIDE 0.9 % IV SOLN
20.0000 mg/m2 | Freq: Once | INTRAVENOUS | Status: AC
  Administered 2013-01-26: 35 mg via INTRAVENOUS
  Filled 2013-01-26: qty 7

## 2013-01-26 MED ORDER — ONDANSETRON 8 MG/50ML IVPB (CHCC)
8.0000 mg | Freq: Once | INTRAVENOUS | Status: AC
  Administered 2013-01-26: 8 mg via INTRAVENOUS

## 2013-01-26 MED ORDER — ACETAMINOPHEN 325 MG PO TABS
650.0000 mg | ORAL_TABLET | Freq: Once | ORAL | Status: AC
  Administered 2013-01-26: 650 mg via ORAL

## 2013-01-26 MED ORDER — SODIUM CHLORIDE 0.9 % IV SOLN: Freq: Once | INTRAVENOUS | Status: DC

## 2013-01-26 NOTE — Progress Notes (Signed)
OK to treat per Dr. Darnelle Catalan with current lab values.  TKF

## 2013-01-26 NOTE — Patient Instructions (Addendum)
[facility] Discharge Instructions for Patients Receiving Chemotherapy  Today you received the following chemotherapy agents: dacogen  To help prevent nausea and vomiting after your treatment, we encourage you to take your nausea medication.  Take it as often as prescribed.     If you develop nausea and vomiting that is not controlled by your nausea medication, call the clinic. If it is after clinic hours your family physician or the after hours number for the clinic or go to the Emergency Department.   BELOW ARE SYMPTOMS THAT SHOULD BE REPORTED IMMEDIATELY:  *FEVER GREATER THAN 100.5 F  *CHILLS WITH OR WITHOUT FEVER  NAUSEA AND VOMITING THAT IS NOT CONTROLLED WITH YOUR NAUSEA MEDICATION  *UNUSUAL SHORTNESS OF BREATH  *UNUSUAL BRUISING OR BLEEDING  TENDERNESS IN MOUTH AND THROAT WITH OR WITHOUT PRESENCE OF ULCERS  *URINARY PROBLEMS  *BOWEL PROBLEMS  UNUSUAL RASH Items with * indicate a potential emergency and should be followed up as soon as possible.  Feel free to call the clinic you have any questions or concerns. The clinic phone number is [phone].   I have been informed and understand all the instructions given to me. I know to contact the clinic, my physician, or go to the Emergency Department if any problems should occur. I do not have any questions at this time, but understand that I may call the clinic during office hours   should I have any questions or need assistance in obtaining follow up care.    __________________________________________  _____________  __________ Signature of Patient or Authorized Representative            Date                   Time    __________________________________________ Nurse's Signature    

## 2013-01-27 ENCOUNTER — Ambulatory Visit (HOSPITAL_BASED_OUTPATIENT_CLINIC_OR_DEPARTMENT_OTHER): Payer: Medicare Other

## 2013-01-27 ENCOUNTER — Other Ambulatory Visit: Payer: Self-pay

## 2013-01-27 ENCOUNTER — Telehealth: Payer: Self-pay | Admitting: *Deleted

## 2013-01-27 VITALS — BP 128/53 | HR 74 | Temp 97.4°F | Resp 18

## 2013-01-27 DIAGNOSIS — D649 Anemia, unspecified: Secondary | ICD-10-CM

## 2013-01-27 DIAGNOSIS — C9400 Acute erythroid leukemia, not having achieved remission: Secondary | ICD-10-CM

## 2013-01-27 DIAGNOSIS — Z5111 Encounter for antineoplastic chemotherapy: Secondary | ICD-10-CM

## 2013-01-27 DIAGNOSIS — D72819 Decreased white blood cell count, unspecified: Secondary | ICD-10-CM

## 2013-01-27 DIAGNOSIS — D473 Essential (hemorrhagic) thrombocythemia: Secondary | ICD-10-CM

## 2013-01-27 DIAGNOSIS — I629 Nontraumatic intracranial hemorrhage, unspecified: Secondary | ICD-10-CM

## 2013-01-27 MED ORDER — ONDANSETRON 8 MG/50ML IVPB (CHCC)
8.0000 mg | Freq: Once | INTRAVENOUS | Status: AC
  Administered 2013-01-27: 8 mg via INTRAVENOUS

## 2013-01-27 MED ORDER — SODIUM CHLORIDE 0.9 % IJ SOLN
10.0000 mL | INTRAMUSCULAR | Status: DC | PRN
  Filled 2013-01-27: qty 10

## 2013-01-27 MED ORDER — HEPARIN SOD (PORK) LOCK FLUSH 100 UNIT/ML IV SOLN
500.0000 [IU] | Freq: Once | INTRAVENOUS | Status: AC | PRN
  Administered 2013-01-27: 500 [IU]
  Filled 2013-01-27: qty 5

## 2013-01-27 MED ORDER — SODIUM CHLORIDE 0.9 % IJ SOLN
10.0000 mL | INTRAMUSCULAR | Status: AC | PRN
  Administered 2013-01-27: 10 mL
  Filled 2013-01-27: qty 10

## 2013-01-27 MED ORDER — ACETAMINOPHEN 325 MG PO TABS
650.0000 mg | ORAL_TABLET | Freq: Once | ORAL | Status: AC
  Administered 2013-01-27: 650 mg via ORAL

## 2013-01-27 MED ORDER — DEXAMETHASONE SODIUM PHOSPHATE 10 MG/ML IJ SOLN
10.0000 mg | Freq: Once | INTRAMUSCULAR | Status: AC
  Administered 2013-01-27: 10 mg via INTRAVENOUS

## 2013-01-27 MED ORDER — SODIUM CHLORIDE 0.9 % IV SOLN
250.0000 mL | Freq: Once | INTRAVENOUS | Status: AC
  Administered 2013-01-27: 250 mL via INTRAVENOUS

## 2013-01-27 MED ORDER — HEPARIN SOD (PORK) LOCK FLUSH 100 UNIT/ML IV SOLN
500.0000 [IU] | Freq: Every day | INTRAVENOUS | Status: DC | PRN
  Filled 2013-01-27: qty 5

## 2013-01-27 MED ORDER — DIPHENHYDRAMINE HCL 25 MG PO CAPS
25.0000 mg | ORAL_CAPSULE | Freq: Once | ORAL | Status: AC
  Administered 2013-01-27: 25 mg via ORAL

## 2013-01-27 MED ORDER — SODIUM CHLORIDE 0.9 % IV SOLN
20.0000 mg/m2 | Freq: Once | INTRAVENOUS | Status: AC
  Administered 2013-01-27: 35 mg via INTRAVENOUS
  Filled 2013-01-27: qty 7

## 2013-01-27 NOTE — Telephone Encounter (Signed)
Per staff message and POF I have scheduled appts.  JMW  

## 2013-01-27 NOTE — Patient Instructions (Addendum)
[facility] Discharge Instructions for Patients Receiving Chemotherapy  Today you received the following chemotherapy agents Dacogen  To help prevent nausea and vomiting after your treatment, we encourage you to take your nausea medication as prescribed.   If you develop nausea and vomiting that is not controlled by your nausea medication, call the clinic.   BELOW ARE SYMPTOMS THAT SHOULD BE REPORTED IMMEDIATELY:  *FEVER GREATER THAN 100.5 F  *CHILLS WITH OR WITHOUT FEVER  NAUSEA AND VOMITING THAT IS NOT CONTROLLED WITH YOUR NAUSEA MEDICATION  *UNUSUAL SHORTNESS OF BREATH  *UNUSUAL BRUISING OR BLEEDING  TENDERNESS IN MOUTH AND THROAT WITH OR WITHOUT PRESENCE OF ULCERS  *URINARY PROBLEMS  *BOWEL PROBLEMS  UNUSUAL RASH Items with * indicate a potential emergency and should be followed up as soon as possible.  Feel free to call the clinic you have any questions or concerns. The clinic phone number is [phone].  Blood Transfusion Information WHAT IS A BLOOD TRANSFUSION? A transfusion is the replacement of blood or some of its parts. Blood is made up of multiple cells which provide different functions.  Red blood cells carry oxygen and are used for blood loss replacement.  White blood cells fight against infection.  Platelets control bleeding.  Plasma helps clot blood.  Other blood products are available for specialized needs, such as hemophilia or other clotting disorders. BEFORE THE TRANSFUSION  Who gives blood for transfusions?   You may be able to donate blood to be used at a later date on yourself (autologous donation).  Relatives can be asked to donate blood. This is generally not any safer than if you have received blood from a stranger. The same precautions are taken to ensure safety when a relative's blood is donated.  Healthy volunteers who are fully evaluated to make sure their blood is safe. This is blood bank blood. Transfusion therapy  is the safest it has ever been in the practice of medicine. Before blood is taken from a donor, a complete history is taken to make sure that person has no history of diseases nor engages in risky social behavior (examples are intravenous drug use or sexual activity with multiple partners). The donor's travel history is screened to minimize risk of transmitting infections, such as malaria. The donated blood is tested for signs of infectious diseases, such as HIV and hepatitis. The blood is then tested to be sure it is compatible with you in order to minimize the chance of a transfusion reaction. If you or a relative donates blood, this is often done in anticipation of surgery and is not appropriate for emergency situations. It takes many days to process the donated blood. RISKS AND COMPLICATIONS Although transfusion therapy is very safe and saves many lives, the main dangers of transfusion include:   Getting an infectious disease.  Developing a transfusion reaction. This is an allergic reaction to something in the blood you were given. Every precaution is taken to prevent this. The decision to have a blood transfusion has been considered carefully by your caregiver before blood is given. Blood is not given unless the benefits outweigh the risks. AFTER THE TRANSFUSION  Right after receiving a blood transfusion, you will usually feel much better and more energetic. This is especially true if your red blood cells have gotten low (anemic). The transfusion raises the level of the red blood cells which carry oxygen, and this usually causes an energy increase.  The nurse administering the transfusion will monitor you carefully for  complications. HOME CARE INSTRUCTIONS  No special instructions are needed after a transfusion. You may find your energy is better. Speak with your caregiver about any limitations on activity for underlying diseases you may have. SEEK MEDICAL CARE IF:   Your condition is not  improving after your transfusion.  You develop redness or irritation at the intravenous (IV) site. SEEK IMMEDIATE MEDICAL CARE IF:  Any of the following symptoms occur over the next 12 hours:  Shaking chills.  You have a temperature by mouth above 102 F (38.9 C), not controlled by medicine.  Chest, back, or muscle pain.  People around you feel you are not acting correctly or are confused.  Shortness of breath or difficulty breathing.  Dizziness and fainting.  You get a rash or develop hives.  You have a decrease in urine output.  Your urine turns a dark color or changes to pink, red, or brown. Any of the following symptoms occur over the next 10 days:  You have a temperature by mouth above 102 F (38.9 C), not controlled by medicine.  Shortness of breath.  Weakness after normal activity.  The white part of the eye turns yellow (jaundice).  You have a decrease in the amount of urine or are urinating less often.  Your urine turns a dark color or changes to pink, red, or brown. Document Released: 07/06/2000 Document Revised: 10/01/2011 Document Reviewed: 02/23/2008 [facility] Patient Information 2014 [place], [place].

## 2013-01-28 ENCOUNTER — Ambulatory Visit (HOSPITAL_BASED_OUTPATIENT_CLINIC_OR_DEPARTMENT_OTHER): Payer: Medicare Other

## 2013-01-28 DIAGNOSIS — C9400 Acute erythroid leukemia, not having achieved remission: Secondary | ICD-10-CM

## 2013-01-28 DIAGNOSIS — D72819 Decreased white blood cell count, unspecified: Secondary | ICD-10-CM

## 2013-01-28 DIAGNOSIS — D649 Anemia, unspecified: Secondary | ICD-10-CM

## 2013-01-28 DIAGNOSIS — Z5111 Encounter for antineoplastic chemotherapy: Secondary | ICD-10-CM

## 2013-01-28 DIAGNOSIS — D473 Essential (hemorrhagic) thrombocythemia: Secondary | ICD-10-CM

## 2013-01-28 DIAGNOSIS — I629 Nontraumatic intracranial hemorrhage, unspecified: Secondary | ICD-10-CM

## 2013-01-28 LAB — TYPE AND SCREEN
Donor AG Type: NEGATIVE
Unit division: 0

## 2013-01-28 MED ORDER — ONDANSETRON 8 MG/50ML IVPB (CHCC)
8.0000 mg | Freq: Once | INTRAVENOUS | Status: AC
  Administered 2013-01-28: 8 mg via INTRAVENOUS

## 2013-01-28 MED ORDER — SODIUM CHLORIDE 0.9 % IJ SOLN
10.0000 mL | INTRAMUSCULAR | Status: DC | PRN
  Administered 2013-01-28: 10 mL
  Filled 2013-01-28: qty 10

## 2013-01-28 MED ORDER — SODIUM CHLORIDE 0.9 % IV SOLN
20.0000 mg/m2 | Freq: Once | INTRAVENOUS | Status: AC
  Administered 2013-01-28: 35 mg via INTRAVENOUS
  Filled 2013-01-28: qty 7

## 2013-01-28 MED ORDER — HEPARIN SOD (PORK) LOCK FLUSH 100 UNIT/ML IV SOLN
500.0000 [IU] | Freq: Once | INTRAVENOUS | Status: AC | PRN
  Administered 2013-01-28: 500 [IU]
  Filled 2013-01-28: qty 5

## 2013-01-28 MED ORDER — DEXAMETHASONE SODIUM PHOSPHATE 10 MG/ML IJ SOLN
10.0000 mg | Freq: Once | INTRAMUSCULAR | Status: AC
  Administered 2013-01-28: 10 mg via INTRAVENOUS

## 2013-01-28 MED ORDER — SODIUM CHLORIDE 0.9 % IV SOLN
Freq: Once | INTRAVENOUS | Status: AC
  Administered 2013-01-28: 13:00:00 via INTRAVENOUS

## 2013-01-28 NOTE — Patient Instructions (Signed)
[facility] Discharge Instructions for Patients Receiving Chemotherapy  Today you received the following chemotherapy agents: dacogen  To help prevent nausea and vomiting after your treatment, we encourage you to take your nausea medication.  Take it as often as prescribed.     If you develop nausea and vomiting that is not controlled by your nausea medication, call the clinic. If it is after clinic hours your family physician or the after hours number for the clinic or go to the Emergency Department.   BELOW ARE SYMPTOMS THAT SHOULD BE REPORTED IMMEDIATELY:  *FEVER GREATER THAN 100.5 F  *CHILLS WITH OR WITHOUT FEVER  NAUSEA AND VOMITING THAT IS NOT CONTROLLED WITH YOUR NAUSEA MEDICATION  *UNUSUAL SHORTNESS OF BREATH  *UNUSUAL BRUISING OR BLEEDING  TENDERNESS IN MOUTH AND THROAT WITH OR WITHOUT PRESENCE OF ULCERS  *URINARY PROBLEMS  *BOWEL PROBLEMS  UNUSUAL RASH Items with * indicate a potential emergency and should be followed up as soon as possible.  Feel free to call the clinic you have any questions or concerns. The clinic phone number is [phone].   I have been informed and understand all the instructions given to me. I know to contact the clinic, my physician, or go to the Emergency Department if any problems should occur. I do not have any questions at this time, but understand that I may call the clinic during office hours   should I have any questions or need assistance in obtaining follow up care.    __________________________________________  _____________  __________ Signature of Patient or Authorized Representative            Date                   Time    __________________________________________ Nurse's Signature    

## 2013-01-29 ENCOUNTER — Ambulatory Visit (HOSPITAL_BASED_OUTPATIENT_CLINIC_OR_DEPARTMENT_OTHER): Payer: Medicare Other

## 2013-01-29 VITALS — BP 162/65 | HR 59 | Temp 97.8°F | Resp 18

## 2013-01-29 DIAGNOSIS — D473 Essential (hemorrhagic) thrombocythemia: Secondary | ICD-10-CM

## 2013-01-29 DIAGNOSIS — I629 Nontraumatic intracranial hemorrhage, unspecified: Secondary | ICD-10-CM

## 2013-01-29 DIAGNOSIS — D649 Anemia, unspecified: Secondary | ICD-10-CM

## 2013-01-29 DIAGNOSIS — Z5111 Encounter for antineoplastic chemotherapy: Secondary | ICD-10-CM

## 2013-01-29 DIAGNOSIS — D72819 Decreased white blood cell count, unspecified: Secondary | ICD-10-CM

## 2013-01-29 DIAGNOSIS — C9400 Acute erythroid leukemia, not having achieved remission: Secondary | ICD-10-CM

## 2013-01-29 MED ORDER — SODIUM CHLORIDE 0.9 % IV SOLN
20.0000 mg/m2 | Freq: Once | INTRAVENOUS | Status: AC
  Administered 2013-01-29: 35 mg via INTRAVENOUS
  Filled 2013-01-29: qty 7

## 2013-01-29 MED ORDER — SODIUM CHLORIDE 0.9 % IJ SOLN
10.0000 mL | INTRAMUSCULAR | Status: DC | PRN
  Administered 2013-01-29: 10 mL
  Filled 2013-01-29: qty 10

## 2013-01-29 MED ORDER — SODIUM CHLORIDE 0.9 % IV SOLN
Freq: Once | INTRAVENOUS | Status: AC
  Administered 2013-01-29: 12:00:00 via INTRAVENOUS

## 2013-01-29 MED ORDER — DEXAMETHASONE SODIUM PHOSPHATE 10 MG/ML IJ SOLN
10.0000 mg | Freq: Once | INTRAMUSCULAR | Status: AC
  Administered 2013-01-29: 10 mg via INTRAVENOUS

## 2013-01-29 MED ORDER — HEPARIN SOD (PORK) LOCK FLUSH 100 UNIT/ML IV SOLN
500.0000 [IU] | Freq: Once | INTRAVENOUS | Status: AC | PRN
  Administered 2013-01-29: 500 [IU]
  Filled 2013-01-29: qty 5

## 2013-01-29 MED ORDER — ONDANSETRON 8 MG/50ML IVPB (CHCC)
8.0000 mg | Freq: Once | INTRAVENOUS | Status: AC
  Administered 2013-01-29: 8 mg via INTRAVENOUS

## 2013-01-29 NOTE — Patient Instructions (Signed)
[facility] Discharge Instructions for Patients Receiving Chemotherapy  Today you received the following chemotherapy agents Dacogen  To help prevent nausea and vomiting after your treatment, we encourage you to take your nausea medication as prescribed.   If you develop nausea and vomiting that is not controlled by your nausea medication, call the clinic.   BELOW ARE SYMPTOMS THAT SHOULD BE REPORTED IMMEDIATELY:  *FEVER GREATER THAN 100.5 F  *CHILLS WITH OR WITHOUT FEVER  NAUSEA AND VOMITING THAT IS NOT CONTROLLED WITH YOUR NAUSEA MEDICATION  *UNUSUAL SHORTNESS OF BREATH  *UNUSUAL BRUISING OR BLEEDING  TENDERNESS IN MOUTH AND THROAT WITH OR WITHOUT PRESENCE OF ULCERS  *URINARY PROBLEMS  *BOWEL PROBLEMS  UNUSUAL RASH Items with * indicate a potential emergency and should be followed up as soon as possible.  Feel free to call the clinic you have any questions or concerns. The clinic phone number is [phone].    

## 2013-01-30 ENCOUNTER — Ambulatory Visit (HOSPITAL_BASED_OUTPATIENT_CLINIC_OR_DEPARTMENT_OTHER): Payer: Medicare Other

## 2013-01-30 VITALS — BP 143/73 | HR 59 | Temp 97.8°F | Resp 18

## 2013-01-30 DIAGNOSIS — D649 Anemia, unspecified: Secondary | ICD-10-CM

## 2013-01-30 DIAGNOSIS — I629 Nontraumatic intracranial hemorrhage, unspecified: Secondary | ICD-10-CM

## 2013-01-30 DIAGNOSIS — D72819 Decreased white blood cell count, unspecified: Secondary | ICD-10-CM

## 2013-01-30 DIAGNOSIS — D473 Essential (hemorrhagic) thrombocythemia: Secondary | ICD-10-CM

## 2013-01-30 DIAGNOSIS — Z5111 Encounter for antineoplastic chemotherapy: Secondary | ICD-10-CM

## 2013-01-30 DIAGNOSIS — C9400 Acute erythroid leukemia, not having achieved remission: Secondary | ICD-10-CM

## 2013-01-30 MED ORDER — ONDANSETRON 8 MG/50ML IVPB (CHCC)
8.0000 mg | Freq: Once | INTRAVENOUS | Status: AC
  Administered 2013-01-30: 8 mg via INTRAVENOUS

## 2013-01-30 MED ORDER — DEXAMETHASONE SODIUM PHOSPHATE 10 MG/ML IJ SOLN
10.0000 mg | Freq: Once | INTRAMUSCULAR | Status: AC
  Administered 2013-01-30: 10 mg via INTRAVENOUS

## 2013-01-30 MED ORDER — HEPARIN SOD (PORK) LOCK FLUSH 100 UNIT/ML IV SOLN
500.0000 [IU] | Freq: Once | INTRAVENOUS | Status: AC | PRN
  Administered 2013-01-30: 500 [IU]
  Filled 2013-01-30: qty 5

## 2013-01-30 MED ORDER — SODIUM CHLORIDE 0.9 % IV SOLN
20.0000 mg/m2 | Freq: Once | INTRAVENOUS | Status: AC
  Administered 2013-01-30: 35 mg via INTRAVENOUS
  Filled 2013-01-30: qty 7

## 2013-01-30 MED ORDER — SODIUM CHLORIDE 0.9 % IV SOLN
Freq: Once | INTRAVENOUS | Status: AC
  Administered 2013-01-30: 12:00:00 via INTRAVENOUS

## 2013-01-30 MED ORDER — SODIUM CHLORIDE 0.9 % IJ SOLN
10.0000 mL | INTRAMUSCULAR | Status: DC | PRN
  Administered 2013-01-30: 10 mL
  Filled 2013-01-30: qty 10

## 2013-01-30 NOTE — Patient Instructions (Addendum)
[facility] Discharge Instructions for Patients Receiving Chemotherapy  Today you received the following chemotherapy agents Dacogen  To help prevent nausea and vomiting after your treatment, we encourage you to take your nausea medication as needed   If you develop nausea and vomiting that is not controlled by your nausea medication, call the clinic.   BELOW ARE SYMPTOMS THAT SHOULD BE REPORTED IMMEDIATELY:  *FEVER GREATER THAN 100.5 F  *CHILLS WITH OR WITHOUT FEVER  NAUSEA AND VOMITING THAT IS NOT CONTROLLED WITH YOUR NAUSEA MEDICATION  *UNUSUAL SHORTNESS OF BREATH  *UNUSUAL BRUISING OR BLEEDING  TENDERNESS IN MOUTH AND THROAT WITH OR WITHOUT PRESENCE OF ULCERS  *URINARY PROBLEMS  *BOWEL PROBLEMS  UNUSUAL RASH Items with * indicate a potential emergency and should be followed up as soon as possible.  Feel free to call the clinic you have any questions or concerns. The clinic phone number is [phone].

## 2013-02-02 ENCOUNTER — Other Ambulatory Visit (HOSPITAL_BASED_OUTPATIENT_CLINIC_OR_DEPARTMENT_OTHER): Payer: Medicare Other | Admitting: Lab

## 2013-02-02 DIAGNOSIS — C9590 Leukemia, unspecified not having achieved remission: Secondary | ICD-10-CM

## 2013-02-02 LAB — CBC WITH DIFFERENTIAL/PLATELET
EOS%: 1 % (ref 0.0–7.0)
Eosinophils Absolute: 0 10*3/uL (ref 0.0–0.5)
LYMPH%: 82.5 % — ABNORMAL HIGH (ref 14.0–49.7)
MCH: 29.3 pg (ref 25.1–34.0)
MCV: 88 fL (ref 79.5–101.0)
MONO%: 3.9 % (ref 0.0–14.0)
Platelets: 299 10*3/uL (ref 145–400)
RBC: 3.68 10*6/uL — ABNORMAL LOW (ref 3.70–5.45)
RDW: 14.4 % (ref 11.2–14.5)
nRBC: 0 % (ref 0–0)

## 2013-02-05 ENCOUNTER — Encounter (HOSPITAL_COMMUNITY): Payer: Self-pay | Admitting: Pharmacy Technician

## 2013-02-09 ENCOUNTER — Other Ambulatory Visit (HOSPITAL_BASED_OUTPATIENT_CLINIC_OR_DEPARTMENT_OTHER): Payer: Medicare Other | Admitting: Lab

## 2013-02-09 DIAGNOSIS — C9200 Acute myeloblastic leukemia, not having achieved remission: Secondary | ICD-10-CM

## 2013-02-09 DIAGNOSIS — D469 Myelodysplastic syndrome, unspecified: Secondary | ICD-10-CM

## 2013-02-09 LAB — REMOTE PACEMAKER DEVICE
AL AMPLITUDE: 1.3 mv
ATRIAL PACING PM: 3
BAMS-0001: 150 {beats}/min
BRDY-0002RV: 60 {beats}/min
BRDY-0003RV: 130 {beats}/min
DEVICE MODEL PM: 111389
VENTRICULAR PACING PM: 100

## 2013-02-09 LAB — CBC WITH DIFFERENTIAL/PLATELET
Basophils Absolute: 0 10*3/uL (ref 0.0–0.1)
EOS%: 0.1 % (ref 0.0–7.0)
Eosinophils Absolute: 0 10*3/uL (ref 0.0–0.5)
HCT: 30.1 % — ABNORMAL LOW (ref 34.8–46.6)
HGB: 10.1 g/dL — ABNORMAL LOW (ref 11.6–15.9)
MCH: 29.7 pg (ref 25.1–34.0)
MCV: 88.3 fL (ref 79.5–101.0)
NEUT#: 0 10*3/uL — CL (ref 1.5–6.5)
NEUT%: 0.9 % — ABNORMAL LOW (ref 38.4–76.8)
lymph#: 0.6 10*3/uL — ABNORMAL LOW (ref 0.9–3.3)

## 2013-02-13 ENCOUNTER — Other Ambulatory Visit: Payer: Self-pay | Admitting: Radiology

## 2013-02-14 ENCOUNTER — Encounter: Payer: Self-pay | Admitting: Cardiovascular Disease

## 2013-02-17 ENCOUNTER — Ambulatory Visit (HOSPITAL_COMMUNITY)
Admission: RE | Admit: 2013-02-17 | Discharge: 2013-02-17 | Disposition: A | Discharge status: Home / Self Care | Payer: Medicare Other | Source: Ambulatory Visit | Attending: Oncology | Admitting: Oncology

## 2013-02-17 ENCOUNTER — Encounter (HOSPITAL_COMMUNITY): Payer: Self-pay

## 2013-02-17 DIAGNOSIS — C9400 Acute erythroid leukemia, not having achieved remission: Secondary | ICD-10-CM | POA: Insufficient documentation

## 2013-02-17 DIAGNOSIS — C9200 Acute myeloblastic leukemia, not having achieved remission: Secondary | ICD-10-CM

## 2013-02-17 LAB — CBC
Hemoglobin: 8.5 g/dL — ABNORMAL LOW (ref 12.0–15.0)
MCH: 29 pg (ref 26.0–34.0)
MCHC: 34 g/dL (ref 30.0–36.0)
RDW: 13.7 % (ref 11.5–15.5)

## 2013-02-17 LAB — APTT: aPTT: 28 seconds (ref 24–37)

## 2013-02-17 LAB — PROTIME-INR: Prothrombin Time: 14.4 seconds (ref 11.6–15.2)

## 2013-02-17 MED ORDER — MIDAZOLAM HCL 2 MG/2ML IJ SOLN
INTRAMUSCULAR | Status: AC
  Filled 2013-02-17: qty 4

## 2013-02-17 MED ORDER — FENTANYL CITRATE 0.05 MG/ML IJ SOLN
INTRAMUSCULAR | Status: AC | PRN
  Administered 2013-02-17: 100 ug via INTRAVENOUS

## 2013-02-17 MED ORDER — MIDAZOLAM HCL 2 MG/2ML IJ SOLN
INTRAMUSCULAR | Status: AC | PRN
  Administered 2013-02-17 (×2): 1 mg via INTRAVENOUS

## 2013-02-17 MED ORDER — SODIUM CHLORIDE 0.9 % IV SOLN
INTRAVENOUS | Status: DC
  Administered 2013-02-17: 09:00:00 via INTRAVENOUS

## 2013-02-17 MED ORDER — FENTANYL CITRATE 0.05 MG/ML IJ SOLN
INTRAMUSCULAR | Status: AC
  Filled 2013-02-17: qty 4

## 2013-02-17 NOTE — Progress Notes (Signed)
CRITICAL VALUE ALERT  Critical value received:  Wbc 1.0  Date of notification:  02/17/2013   Time of notification:  0935  Critical value read back yes   Nurse who received alert:  Ferdinand Lango  MD notified (1st page):  Radiology  Time of first page:  0940  MD notified (2nd page):0940  Time of second page:0940  Responding MD:  Brayton El  Time MD responded:  [phone]

## 2013-02-17 NOTE — Procedures (Signed)
Interventional Radiology Procedure Note  Procedure: CT guided aspirate and core biopsy of right iliac bone Complications: None Recommendations: - Bedrest supine x 2 hrs - Hydrocodone PRN  Pain - Follow biopsy results  Signed,  Heath K. McCullough, MD Vascular & Interventional Radiologist [facility]  

## 2013-02-17 NOTE — H&P (Signed)
Chief Complaint: "I'm here for another bone marrow biopsy" Referring Physician: Magrinat HPI: [name] is an 79 y.o. female with persistent leukopenia and leukemia She has had multiple bone marrow biopsies in the past, most recently on 4/21. IR also recently placed a right sided Portacath in 5/14.  She is referred for another BM biopsy today. PMHx and meds reviewed.  Past Medical History:  Past Medical History  Diagnosis Date  . Pacemaker     left shoulder  . Cataract     had surgery  . GERD (gastroesophageal reflux disease)   . Glaucoma   . Hyperlipidemia   . Osteoporosis   . Thyroid disease   . PUD (peptic ulcer disease)   . Lichen sclerosus et atrophicus of the vulva   . Intracranial hemorrhage   . Leukemia     Past Surgical History:  Past Surgical History  Procedure Laterality Date  . Cataract extraction w/ intraocular lens  implant, bilateral    . Dilation and curettage of uterus    . Tonsillectomy and adenoidectomy    . Pacemaker insertion      left shoulder  . Subdural hematoma evacuation via craniotomy  2005    after a fall  . Colonoscopy      Family History:  Family History  Problem Relation Age of Onset  . Colon cancer Mother     Social History:  reports that she has never smoked. She has never used smokeless tobacco. She reports that she does not drink alcohol or use illicit drugs.  Allergies:  Allergies  Allergen Reactions  . Sulfa Antibiotics     Pt. States she may have had a reaction a long time ago    Medications:   Medication List    ASK your doctor about these medications       alendronate 70 MG tablet  Commonly known as:  FOSAMAX  Take 70 mg by mouth every Saturday. Take with a full glass of water on an empty stomach.     aspirin 325 MG EC tablet  Take 325 mg by mouth every morning.     bimatoprost 0.03 % ophthalmic solution  Commonly known as:  LUMIGAN  Place 1 drop into both eyes at bedtime.     brinzolamide 1 %  ophthalmic suspension  Commonly known as:  AZOPT  Place 1 drop into both eyes 2 (two) times daily.     cholecalciferol 1000 UNITS tablet  Commonly known as:  VITAMIN D  Take 2,000 Units by mouth daily.     docusate sodium 100 MG capsule  Commonly known as:  COLACE  Take 100 mg by mouth daily as needed for constipation (Taken during chemo).     fluconazole 100 MG tablet  Commonly known as:  DIFLUCAN  Take 1 tablet (100 mg total) by mouth daily.     Folic Acid 20 MG Caps  Take 40 mg by mouth daily.     levofloxacin 500 MG tablet  Commonly known as:  LEVAQUIN  Take 500 mg by mouth daily.     levothyroxine 88 MCG tablet  Commonly known as:  SYNTHROID, LEVOTHROID  Take 88 mcg by mouth daily before breakfast.     lidocaine-prilocaine cream  Commonly known as:  EMLA  Apply topically as needed.     MAGNESIUM-ZINC PO  Take 1 tablet by mouth daily.     omega-3 acid ethyl esters 1 G capsule  Commonly known as:  LOVAZA  Take 1 g by  mouth daily.     omeprazole 20 MG capsule  Commonly known as:  PRILOSEC  Take 20 mg by mouth daily.     OVER THE COUNTER MEDICATION  Take 1 capsule by mouth 2 (two) times daily. Macular Benefit vitamin.     pravastatin 40 MG tablet  Commonly known as:  PRAVACHOL  Take 40 mg by mouth every evening.     PRESCRIPTION MEDICATION  Inject into the vein every 28 (twenty-eight) days. Dacogen for 5 days every month.     vitamin B-12 1000 MCG tablet  Commonly known as:  CYANOCOBALAMIN  Take 1,000 mcg by mouth daily.         Please HPI for pertinent positives, otherwise complete 10 system ROS negative.  Physical Exam: Blood pressure 120/71, pulse 88, temperature 97.1 F (36.2 C), temperature source Oral, resp. rate 16, SpO2 100.00%. There is no weight on file to calculate BMI.   General Appearance:  Alert, cooperative, no distress, appears stated age  Head:  Normocephalic, without obvious abnormality, atraumatic  ENT: Unremarkable  Neck:  Supple, symmetrical, trachea midline  Lungs:   Clear to auscultation bilaterally, no w/r/r, respirations unlabored without use of accessory muscles.  Chest Wall:  No tenderness. (L)upper outer chest wall pacemaker palpable, NT. Rt chest Port site clean, NT  Heart:  Regular rate and rhythm, S1, S2 normal, no murmur, rub or gallop.   Neurologic: Normal affect, no gross deficits.   Results for orders placed during the hospital encounter of 02/17/13 (from the past 48 hour(s))  APTT     Status: None   Collection Time    02/17/13  9:15 AM      Result Value Range   aPTT 28  24 - 37 seconds  CBC     Status: Abnormal   Collection Time    02/17/13  9:15 AM      Result Value Range   WBC 1.0 (*) 4.0 - 10.5 K/uL   Comment: REPEATED TO VERIFY     CRITICAL RESULT CALLED TO, READ BACK BY AND VERIFIED WITH:     B. ROBERTS RN AT 0935 ON 07.29.14 BY SHUEA   RBC 2.93 (*) 3.87 - 5.11 MIL/uL   Hemoglobin 8.5 (*) 12.0 - 15.0 g/dL   HCT 16.1 (*) 09.6 - 04.5 %   MCV 85.3  78.0 - 100.0 fL   MCH 29.0  26.0 - 34.0 pg   MCHC 34.0  30.0 - 36.0 g/dL   RDW 40.9  81.1 - 91.4 %   Platelets 169  150 - 400 K/uL  PROTIME-INR     Status: None   Collection Time    02/17/13  9:15 AM      Result Value Range   Prothrombin Time 14.4  11.6 - 15.2 seconds   INR 1.14  0.00 - 1.49   No results found.  Assessment/Plan Leukopenia, Leukemia Repeat BM biopsy today Procedure reviewed with pt including risks and complications. Labs reviewed, ok Consent signed in chart  Brayton El PA-C 02/17/2013, 9:50 AM

## 2013-02-17 NOTE — H&P (Signed)
Agree with PA note.  Signed,  Shawntee Mainwaring K. Cola Highfill, MD [facility]  

## 2013-02-18 ENCOUNTER — Other Ambulatory Visit (HOSPITAL_COMMUNITY): Payer: Self-pay | Admitting: Oncology

## 2013-02-23 ENCOUNTER — Other Ambulatory Visit: Payer: Self-pay | Admitting: *Deleted

## 2013-02-23 ENCOUNTER — Ambulatory Visit (HOSPITAL_BASED_OUTPATIENT_CLINIC_OR_DEPARTMENT_OTHER): Payer: Medicare Other | Admitting: Oncology

## 2013-02-23 ENCOUNTER — Telehealth: Payer: Self-pay | Admitting: *Deleted

## 2013-02-23 ENCOUNTER — Encounter (HOSPITAL_COMMUNITY)
Admission: RE | Admit: 2013-02-23 | Discharge: 2013-02-23 | Disposition: A | Discharge status: Home / Self Care | Payer: Medicare Other | Source: Ambulatory Visit | Attending: Oncology | Admitting: Oncology

## 2013-02-23 ENCOUNTER — Ambulatory Visit: Payer: Medicare Other

## 2013-02-23 ENCOUNTER — Ambulatory Visit: Payer: Medicare Other | Admitting: Lab

## 2013-02-23 ENCOUNTER — Other Ambulatory Visit (HOSPITAL_BASED_OUTPATIENT_CLINIC_OR_DEPARTMENT_OTHER): Payer: Medicare Other | Admitting: Lab

## 2013-02-23 VITALS — BP 124/71 | HR 99 | Temp 98.3°F | Resp 20 | Ht 64.0 in | Wt 144.2 lb

## 2013-02-23 DIAGNOSIS — C9400 Acute erythroid leukemia, not having achieved remission: Secondary | ICD-10-CM

## 2013-02-23 DIAGNOSIS — D709 Neutropenia, unspecified: Secondary | ICD-10-CM

## 2013-02-23 DIAGNOSIS — D473 Essential (hemorrhagic) thrombocythemia: Secondary | ICD-10-CM

## 2013-02-23 DIAGNOSIS — C9200 Acute myeloblastic leukemia, not having achieved remission: Secondary | ICD-10-CM

## 2013-02-23 DIAGNOSIS — D630 Anemia in neoplastic disease: Secondary | ICD-10-CM | POA: Insufficient documentation

## 2013-02-23 DIAGNOSIS — D649 Anemia, unspecified: Secondary | ICD-10-CM | POA: Insufficient documentation

## 2013-02-23 LAB — CBC WITH DIFFERENTIAL/PLATELET
BASO%: 0 % (ref 0.0–2.0)
Eosinophils Absolute: 0 10*3/uL (ref 0.0–0.5)
HCT: 22.8 % — ABNORMAL LOW (ref 34.8–46.6)
LYMPH%: 56.9 % — ABNORMAL HIGH (ref 14.0–49.7)
MCHC: 32.5 g/dL (ref 31.5–36.0)
MCV: 86.7 fL (ref 79.5–101.0)
MONO#: 0.3 10*3/uL (ref 0.1–0.9)
MONO%: 23.8 % — ABNORMAL HIGH (ref 0.0–14.0)
NEUT%: 19.3 % — ABNORMAL LOW (ref 38.4–76.8)
Platelets: 286 10*3/uL (ref 145–400)
WBC: 1.3 10*3/uL — ABNORMAL LOW (ref 3.9–10.3)

## 2013-02-23 NOTE — Telephone Encounter (Signed)
appts made and printed. Pt is aware that i will give her a call w/ appt d/t for her trans. i spoke w/ Seward Grater and she stated shes going to find a spot and give me a call...td

## 2013-02-23 NOTE — Progress Notes (Signed)
ID: [name]   DOB: [birth year]  MR#: [id number]  GEX#:[id number]  HISTORY OF PRESENT ILLNESS: The patient sees Dr. [name] for routine health maintenance. On March 2013 he noted that the patient's hemoglobin was 8.7 (previously it had been 11.1). The MCV was at 107.1. The platelet count was 660,000. The white cell count was 4.2. He obtained a B12 and folate which were within normal limits at 1744 and 8.4 respectively.[name] He checked a ferritin to evaluate the thrombocytosis, and this was 151. Serum protein electrophoresis did not show an M spike and the urine protein electrophoresis was likewise negative. He referred the patient for further evaluation. 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 Since her last visit here she had her restaging bone marrow biopsy. Unfortunately this shows significant progression.  REVIEW OF SYSTEMS: [name] tolerated her decitabine without significant complications. In particular she had no intercurrent infections or bleeding. Her energy remains good unless her hemoglobin drops below 8. She responds nicely to transfusions. A detailed review of systems today was otherwise entirely negative.  PAST MEDICAL HISTORY: Past Medical History  Diagnosis Date  . Pacemaker     left shoulder  . Cataract     had surgery  . GERD (gastroesophageal reflux disease)   . Glaucoma   . Hyperlipidemia   . Osteoporosis   . Thyroid disease   . PUD (peptic ulcer disease)   . Lichen sclerosus et atrophicus of the vulva   . Intracranial hemorrhage   . Leukemia     PAST SURGICAL HISTORY: Past Surgical History  Procedure Laterality Date  . Cataract extraction w/ intraocular lens  implant, bilateral    . Dilation and curettage of uterus    . Tonsillectomy and adenoidectomy    . Pacemaker insertion      left shoulder  . Subdural hematoma evacuation via craniotomy  2003-12-03   after a fall  . Colonoscopy      FAMILY HISTORY Family History  Problem Relation Age of Onset  . Colon cancer Mother    the patient's father died at the age of 30 from a myocardial infarction. Her mother was diagnosed with colon cancer at the age of 10 and died from that disease within a year. The patient had 2 sisters. One died at the age of 58 with a neck fracture. The patient has 2 brothers. There is no other history of cancer or hematologic problems in the family to her knowledge  GYNECOLOGIC HISTORY: She does not recall when she had menarche. She is GX P1, first live birth age 19. She went through the change of life in her 32s. She never took hormone replacement.  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 Her husband died in 03-Dec-1995 from a heart attack. The patient lives alone, with no pets. Her daughter, [name] lives in [place]. She has a PhD in Water quality scientist and works for [employer]. Her phone number is [phone].The patient's brother [name] lives next door and in case of an emergency she would like [name] to call him first. His phone number is [phone]   ADVANCED DIRECTIVES: in place  HEALTH MAINTENANCE: History  Substance Use Topics  . Smoking status: Never Smoker   . Smokeless tobacco: Never Used  . Alcohol Use: No     Colonoscopy: [facility]/ 2010/12/03 PAP: "  no longer"  Bone density: osteoporosis   Lipid panel: [name]             Mammogram: Nov 2012  Allergies  Allergen Reactions  . Sulfa Antibiotics     Pt. States she may have had a reaction a long time ago     Filed Vitals:   02/23/13 1331  BP: 124/71  Pulse: 99  Temp: 98.3 F (36.8 C)  Resp: 20      OBJECTIVE: Elderly white woman who in no acute distress Sclerae unicteric Oropharynx minimal ulcers, no thrush Lungs no rales or rhonchi Heart regular rate and rhythm Abd benign MSK no focal spinal tenderness, no peripheral  edema Neuro: nonfocal, well oriented, friendly affect Breasts: Deferred   LAB RESULTS:   % blasts (date)     By flow On marrow  12/11/2011  22%  5-15%  03/19/2012   --  5%  06/12/2012   --  <5%  09/10/2012  5%  5%  11/03/2012  8%  6%  02/17/2013  37%  17%    Lab Results  Component Value Date   WBC 1.3* 02/23/2013   NEUTROABS 0.3* 02/23/2013   HGB 7.4* 02/23/2013   HCT 22.8* 02/23/2013   MCV 86.7 02/23/2013   PLT 286 02/23/2013    CMP     Component Value Date/Time   NA 141 11/24/2012 1424   NA 141 02/06/2012 1047   K 4.0 11/24/2012 1424   K 3.9 02/06/2012 1047   CL 108* 11/24/2012 1424   CL 105 02/06/2012 1047   CO2 27 11/24/2012 1424   CO2 27 02/06/2012 1047   GLUCOSE 102* 11/24/2012 1424   GLUCOSE 92 02/06/2012 1047   BUN 24.6 11/24/2012 1424   BUN 20 02/06/2012 1047   CREATININE 0.7 11/24/2012 1424   CREATININE 0.72 02/06/2012 1047   CALCIUM 9.1 11/24/2012 1424   CALCIUM 9.1 02/06/2012 1047   PROT 6.4 11/24/2012 1424   PROT 6.1 11/30/2011 1034   ALBUMIN 3.4* 11/24/2012 1424   ALBUMIN 4.0 11/30/2011 1034   AST 15 11/24/2012 1424   AST 19 11/30/2011 1034   ALT 14 11/24/2012 1424   ALT 16 11/30/2011 1034   ALKPHOS 61 11/24/2012 1424   ALKPHOS 50 11/30/2011 1034   BILITOT 0.71 11/24/2012 1424   BILITOT 0.4 11/30/2011 1034   GFRNONAA 78* 02/06/2012 1047   GFRAA >90 02/06/2012 1047     STUDIES: Patient: [name] Conner Collected: 02/17/2013 Client: [facility] Accession: [id number] Received: 02/17/2013 Chloe Moan, MD DOB: Oct 07, 1928 Age: 79 Gender: F Reported: 02/19/2013 [address] Patient Ph: [phone] MRN #: [id number] [place], [place] [address] Visit #: [id number] Chart #: Phone: [phone] Fax: CC: Ruthann Cancer, MD FLOW CYTOMETRY REPORT INTERPRETATION Interpretation Bone Marrow Flow Cytometry - SIGNIFICANT INCREASE IN MYELOBLASTIC CELLS PRESENT. Guerry Bruin MD Pathologist, Electronic Signature (Case signed 02/19/2013) GROSS AND MICROSCOPIC INFORMATION Source Bone  Marrow Flow Cytometry Microscopic Gated population: Flow cytometric immunophenotyping is performed using antibiodies to the antigens listed in the table below. Electronic gates are placed around a cell cluster displaying light scatter properties corresponding to blasts. - Abnormal Cells in sample: 37% - Phenotype of Abnormal Cells: CD7, CD13, CD15, CD33, CD34, CD117, HLA-Dr 1 of 2 FINAL for [name], [name] ([id number]) Specimen Table Lymphoid Associated Myeloid Associated Misc. As CD2 neg CD19 neg CD11c neg CD45 pos CD3 neg CD20 neg CD13 pos HLA-DR pos CD4 ND CD21 ND CD14 neg CD10 neg CD5 neg CD22 neg CD15 pos  CD56/16 ND CD7 pos CD23 ND CD33 pos ZAP70 ND CD8 ND CD103 ND MPO ND CD34 pos CD25 ND FMC7 ND CD117 pos CD52 ND sKappa ND CD38 ND sLambda ND Glycophori neg A cKappa ND cLambda ND Gross ref. [id number] All controls stained appropriately. The above tests were developed and their performance characteristics determined by the [facility]. They have not been cleared or approved by the U.S. Food and Drug Administration." Report signed from the following location(s) Interpretation performed at [facility] [address], [place], [place] [address]. CLIA #: [id number], 2 of   ASSESSMENT: 79 y.o.  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   (1) started azacytidine 01/14/2012, stopped after 10 cycles (last dose 10/24/2012) because of worsening neutropenia and increasing transfusion requirements with bone  marrow biopsy showing continuing hypercellularity and slight increase in blast count (to 6%).  (2) anemia, s/p transfusion 8 units PRBCs (02/04/2012, 02/28/2012, 10/01/2012 and 11/07/2012)  (3) neutropenia--progressive, afebrile; on prophylactic Levaquin and Diflucan  (4) thrombocytosis-- currently thrombocytopenic (for the first time)  (5)  Started Dacogen (decitabine), given IV on days 1-5 of each 28 day cycle, first dose given on 11/24/2012, third cycle completed 01/30/2013. Discontinued due to progression   PLAN:  We Discussed her situation at length today. 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  Nevertheless, since she looks so remarkable, we did discuss admission and aggressive treatment. She understands the morbidity and mortality of that are prohibitive. We talked about hospice referral. We talked about looking for phase I studies. We also discussed lenalidomide.  All these options the lenalidomide combined with hospice of was the one that she was most interested in. She wishes to discuss this with her daughter and in fact her daughter called me this evening and we discussed Keagan situation at length. They both understand that in the absence of effective treatment (and there is no effective treatment) we may be looking at less than 2 months survival. The patient's daughter Larita Fife is planning to visit this weekend.  I have asked Maurianna to let me know at this week what her choices are. I have tentatively made her return appointment with me for this Friday (I will be out of town next week) so we can operationalize her option.in the meantime I am setting her up for transfusion of red blood cells tomorrow. She knows to call for any problems that may develop before  the next visit here.    [name] MAGRINAT,GUSTAV C    02/23/2013

## 2013-02-24 ENCOUNTER — Telehealth: Payer: Self-pay | Admitting: *Deleted

## 2013-02-24 ENCOUNTER — Other Ambulatory Visit: Payer: Self-pay | Admitting: *Deleted

## 2013-02-24 ENCOUNTER — Ambulatory Visit (HOSPITAL_BASED_OUTPATIENT_CLINIC_OR_DEPARTMENT_OTHER): Payer: Medicare Other

## 2013-02-24 VITALS — BP 123/47 | HR 86 | Temp 97.6°F | Resp 20

## 2013-02-24 DIAGNOSIS — D630 Anemia in neoplastic disease: Secondary | ICD-10-CM

## 2013-02-24 DIAGNOSIS — C9400 Acute erythroid leukemia, not having achieved remission: Secondary | ICD-10-CM

## 2013-02-24 MED ORDER — SODIUM CHLORIDE 0.9 % IV SOLN
250.0000 mL | Freq: Once | INTRAVENOUS | Status: AC
  Administered 2013-02-24: 250 mL via INTRAVENOUS

## 2013-02-24 MED ORDER — HEPARIN SOD (PORK) LOCK FLUSH 100 UNIT/ML IV SOLN
500.0000 [IU] | Freq: Every day | INTRAVENOUS | Status: AC | PRN
  Administered 2013-02-24: 500 [IU]
  Filled 2013-02-24: qty 5

## 2013-02-24 MED ORDER — SODIUM CHLORIDE 0.9 % IJ SOLN
10.0000 mL | INTRAMUSCULAR | Status: AC | PRN
  Administered 2013-02-24: 10 mL
  Filled 2013-02-24: qty 10

## 2013-02-24 NOTE — Telephone Encounter (Signed)
sw pt gv appt for blood today @ 2:30pm and 02/24/13 @ 2pm. Pt is aware...td

## 2013-02-24 NOTE — Patient Instructions (Addendum)
Blood Transfusion Information WHAT IS A BLOOD TRANSFUSION? A transfusion is the replacement of blood or some of its parts. Blood is made up of multiple cells which provide different functions.  Red blood cells carry oxygen and are used for blood loss replacement.  White blood cells fight against infection.  Platelets control bleeding.  Plasma helps clot blood.  Other blood products are available for specialized needs, such as hemophilia or other clotting disorders. BEFORE THE TRANSFUSION  Who gives blood for transfusions?   You may be able to donate blood to be used at a later date on yourself (autologous donation).  Relatives can be asked to donate blood. This is generally not any safer than if you have received blood from a stranger. The same precautions are taken to ensure safety when a relative's blood is donated.  Healthy volunteers who are fully evaluated to make sure their blood is safe. This is blood bank blood. Transfusion therapy is the safest it has ever been in the practice of medicine. Before blood is taken from a donor, a complete history is taken to make sure that person has no history of diseases nor engages in risky social behavior (examples are intravenous drug use or sexual activity with multiple partners). The donor's travel history is screened to minimize risk of transmitting infections, such as malaria. The donated blood is tested for signs of infectious diseases, such as HIV and hepatitis. The blood is then tested to be sure it is compatible with you in order to minimize the chance of a transfusion reaction. If you or a relative donates blood, this is often done in anticipation of surgery and is not appropriate for emergency situations. It takes many days to process the donated blood. RISKS AND COMPLICATIONS Although transfusion therapy is very safe and saves many lives, the main dangers of transfusion include:   Getting an infectious disease.  Developing a  transfusion reaction. This is an allergic reaction to something in the blood you were given. Every precaution is taken to prevent this. The decision to have a blood transfusion has been considered carefully by your caregiver before blood is given. Blood is not given unless the benefits outweigh the risks. AFTER THE TRANSFUSION  Right after receiving a blood transfusion, you will usually feel much better and more energetic. This is especially true if your red blood cells have gotten low (anemic). The transfusion raises the level of the red blood cells which carry oxygen, and this usually causes an energy increase.  The nurse administering the transfusion will monitor you carefully for complications. HOME CARE INSTRUCTIONS  No special instructions are needed after a transfusion. You may find your energy is better. Speak with your caregiver about any limitations on activity for underlying diseases you may have. SEEK MEDICAL CARE IF:   Your condition is not improving after your transfusion.  You develop redness or irritation at the intravenous (IV) site. SEEK IMMEDIATE MEDICAL CARE IF:  Any of the following symptoms occur over the next 12 hours:  Shaking chills.  You have a temperature by mouth above 102 F (38.9 C), not controlled by medicine.  Chest, back, or muscle pain.  People around you feel you are not acting correctly or are confused.  Shortness of breath or difficulty breathing.  Dizziness and fainting.  You get a rash or develop hives.  You have a decrease in urine output.  Your urine turns a dark color or changes to pink, red, or brown. Any of the following   symptoms occur over the next 10 days:  You have a temperature by mouth above 102 F (38.9 C), not controlled by medicine.  Shortness of breath.  Weakness after normal activity.  The white part of the eye turns yellow (jaundice).  You have a decrease in the amount of urine or are urinating less often.  Your  urine turns a dark color or changes to pink, red, or brown. Document Released: 07/06/2000 Document Revised: 10/01/2011 Document Reviewed: 02/23/2008 Exit[employer] Patient Information 2014 [employer].  

## 2013-02-25 ENCOUNTER — Telehealth: Payer: Self-pay | Admitting: *Deleted

## 2013-02-25 ENCOUNTER — Ambulatory Visit (HOSPITAL_BASED_OUTPATIENT_CLINIC_OR_DEPARTMENT_OTHER): Payer: Medicare Other

## 2013-02-25 VITALS — BP 119/61 | HR 90 | Temp 97.9°F | Resp 18

## 2013-02-25 DIAGNOSIS — D649 Anemia, unspecified: Secondary | ICD-10-CM

## 2013-02-25 MED ORDER — HEPARIN SOD (PORK) LOCK FLUSH 100 UNIT/ML IV SOLN
500.0000 [IU] | Freq: Once | INTRAVENOUS | Status: AC
  Administered 2013-02-25: 500 [IU] via INTRAVENOUS
  Filled 2013-02-25: qty 5

## 2013-02-25 MED ORDER — SODIUM CHLORIDE 0.9 % IJ SOLN
10.0000 mL | Freq: Once | INTRAMUSCULAR | Status: AC
  Administered 2013-02-25: 10 mL
  Filled 2013-02-25: qty 10

## 2013-02-25 MED ORDER — SODIUM CHLORIDE 0.9 % IV SOLN
Freq: Once | INTRAVENOUS | Status: AC
  Administered 2013-02-25: 15:00:00 via INTRAVENOUS

## 2013-02-25 NOTE — Patient Instructions (Addendum)
Blood Transfusion Information WHAT IS A BLOOD TRANSFUSION? A transfusion is the replacement of blood or some of its parts. Blood is made up of multiple cells which provide different functions.  Red blood cells carry oxygen and are used for blood loss replacement.  White blood cells fight against infection.  Platelets control bleeding.  Plasma helps clot blood.  Other blood products are available for specialized needs, such as hemophilia or other clotting disorders. BEFORE THE TRANSFUSION  Who gives blood for transfusions?   You may be able to donate blood to be used at a later date on yourself (autologous donation).  Relatives can be asked to donate blood. This is generally not any safer than if you have received blood from a stranger. The same precautions are taken to ensure safety when a relative's blood is donated.  Healthy volunteers who are fully evaluated to make sure their blood is safe. This is blood bank blood. Transfusion therapy is the safest it has ever been in the practice of medicine. Before blood is taken from a donor, a complete history is taken to make sure that person has no history of diseases nor engages in risky social behavior (examples are intravenous drug use or sexual activity with multiple partners). The donor's travel history is screened to minimize risk of transmitting infections, such as malaria. The donated blood is tested for signs of infectious diseases, such as HIV and hepatitis. The blood is then tested to be sure it is compatible with you in order to minimize the chance of a transfusion reaction. If you or a relative donates blood, this is often done in anticipation of surgery and is not appropriate for emergency situations. It takes many days to process the donated blood. RISKS AND COMPLICATIONS Although transfusion therapy is very safe and saves many lives, the main dangers of transfusion include:   Getting an infectious disease.  Developing a  transfusion reaction. This is an allergic reaction to something in the blood you were given. Every precaution is taken to prevent this. The decision to have a blood transfusion has been considered carefully by your caregiver before blood is given. Blood is not given unless the benefits outweigh the risks. AFTER THE TRANSFUSION  Right after receiving a blood transfusion, you will usually feel much better and more energetic. This is especially true if your red blood cells have gotten low (anemic). The transfusion raises the level of the red blood cells which carry oxygen, and this usually causes an energy increase.  The nurse administering the transfusion will monitor you carefully for complications. HOME CARE INSTRUCTIONS  No special instructions are needed after a transfusion. You may find your energy is better. Speak with your caregiver about any limitations on activity for underlying diseases you may have. SEEK MEDICAL CARE IF:   Your condition is not improving after your transfusion.  You develop redness or irritation at the intravenous (IV) site. SEEK IMMEDIATE MEDICAL CARE IF:  Any of the following symptoms occur over the next 12 hours:  Shaking chills.  You have a temperature by mouth above 102 F (38.9 C), not controlled by medicine.  Chest, back, or muscle pain.  People around you feel you are not acting correctly or are confused.  Shortness of breath or difficulty breathing.  Dizziness and fainting.  You get a rash or develop hives.  You have a decrease in urine output.  Your urine turns a dark color or changes to pink, red, or brown. Any of the following   symptoms occur over the next 10 days:  You have a temperature by mouth above 102 F (38.9 C), not controlled by medicine.  Shortness of breath.  Weakness after normal activity.  The white part of the eye turns yellow (jaundice).  You have a decrease in the amount of urine or are urinating less often.  Your  urine turns a dark color or changes to pink, red, or brown. Document Released: 07/06/2000 Document Revised: 10/01/2011 Document Reviewed: 02/23/2008 Exit[employer] Patient Information 2014 [employer].  

## 2013-02-25 NOTE — Telephone Encounter (Signed)
VM reporting prior authorization for Revlimid is done. Initial co pay for drug will be $2445.15. This will go down in time. They are willing to call patient to begin co pay assist process if physician wants them to proceed. Please confirm. Forwarded message to Dr. Darrall Dears nurse.

## 2013-02-26 ENCOUNTER — Ambulatory Visit: Payer: Medicare Other

## 2013-02-26 LAB — TYPE AND SCREEN
Antibody Screen: NEGATIVE
Donor AG Type: NEGATIVE
Unit division: 0
Unit division: 0

## 2013-02-27 ENCOUNTER — Telehealth: Payer: Self-pay | Admitting: *Deleted

## 2013-02-27 ENCOUNTER — Ambulatory Visit (HOSPITAL_BASED_OUTPATIENT_CLINIC_OR_DEPARTMENT_OTHER): Payer: Medicare Other | Admitting: Oncology

## 2013-02-27 ENCOUNTER — Ambulatory Visit: Payer: Medicare Other

## 2013-02-27 VITALS — BP 112/75 | HR 105 | Temp 98.7°F | Resp 20 | Ht 64.0 in | Wt 145.2 lb

## 2013-02-27 DIAGNOSIS — D709 Neutropenia, unspecified: Secondary | ICD-10-CM

## 2013-02-27 DIAGNOSIS — C9400 Acute erythroid leukemia, not having achieved remission: Secondary | ICD-10-CM

## 2013-02-27 DIAGNOSIS — D649 Anemia, unspecified: Secondary | ICD-10-CM

## 2013-02-27 NOTE — Progress Notes (Signed)
Katalena returns today mostly to discuss her situation. Since her last visit here I have spoken to her daughter and son-in-law and also to Dr. Lowell Guitar at [facility]. We've gone over the options many times. The family and the patient both understand that she now has an acute leukemia and therefore her life expectancy in the absence of effective treatments is brief.  In her favor is the fact that her cancer is making plenty of platelets. They seem to be functional, as she has had no bleeding problems. She is neutropenic and, on 3 antibiotics at present, and anemic, receiving intermittent transfusions. Otherwise she maintains an excellent functional status and certainly intellectually she is intact.  We have pretty much decided not to go with aggressive therapy, which likely would shorten her life rather than extended period we considered ll but it is not clear whether we would be able to obtain that very expensive medicine for her. Hospice/palliative care remains an option.  When she is most interested in however is participating in studies, and she and her daughter Lyn will be meeting with Dr. Lowell Guitar on 03/03/2013 to discuss what is available. The patient's son-in-law is also checking at [facility] to see what they might have to offer.  I have arranged for Valorie to have blood counts here every Monday for the next 8 weeks. We will transfuse as needed, mostly for symptoms. I asked her to stop by my office August 18 to bring you up-to-date and see how we can best support whatever decision she makes. She knows to call for any problems that may develop before her next visit here.

## 2013-02-27 NOTE — Telephone Encounter (Signed)
appts made and printed...td 

## 2013-03-02 ENCOUNTER — Other Ambulatory Visit (HOSPITAL_BASED_OUTPATIENT_CLINIC_OR_DEPARTMENT_OTHER): Payer: Medicare Other | Admitting: Lab

## 2013-03-02 DIAGNOSIS — C9200 Acute myeloblastic leukemia, not having achieved remission: Secondary | ICD-10-CM

## 2013-03-02 LAB — CBC WITH DIFFERENTIAL/PLATELET
Basophils Absolute: 0 10*3/uL (ref 0.0–0.1)
Eosinophils Absolute: 0 10*3/uL (ref 0.0–0.5)
HCT: 23.5 % — ABNORMAL LOW (ref 34.8–46.6)
HGB: 7.8 g/dL — ABNORMAL LOW (ref 11.6–15.9)
LYMPH%: 43.4 % (ref 14.0–49.7)
MONO#: 0.2 10*3/uL (ref 0.1–0.9)
NEUT#: 0.3 10*3/uL — CL (ref 1.5–6.5)
Platelets: 253 10*3/uL (ref 145–400)
RBC: 2.7 10*6/uL — ABNORMAL LOW (ref 3.70–5.45)
WBC: 0.8 10*3/uL — CL (ref 3.9–10.3)
nRBC: 0 % (ref 0–0)

## 2013-03-04 ENCOUNTER — Telehealth: Payer: Self-pay | Admitting: *Deleted

## 2013-03-04 ENCOUNTER — Ambulatory Visit (HOSPITAL_BASED_OUTPATIENT_CLINIC_OR_DEPARTMENT_OTHER): Payer: Medicare Other | Admitting: Lab

## 2013-03-04 ENCOUNTER — Other Ambulatory Visit: Payer: Self-pay | Admitting: Physician Assistant

## 2013-03-04 DIAGNOSIS — C9400 Acute erythroid leukemia, not having achieved remission: Secondary | ICD-10-CM

## 2013-03-04 DIAGNOSIS — D630 Anemia in neoplastic disease: Secondary | ICD-10-CM

## 2013-03-04 LAB — CBC WITH DIFFERENTIAL/PLATELET
Basophils Absolute: 0 10*3/uL (ref 0.0–0.1)
Eosinophils Absolute: 0 10*3/uL (ref 0.0–0.5)
HGB: 7.4 g/dL — ABNORMAL LOW (ref 11.6–15.9)
MCV: 88 fL (ref 79.5–101.0)
MONO%: 20.9 % — ABNORMAL HIGH (ref 0.0–14.0)
NEUT#: 0.4 10*3/uL — CL (ref 1.5–6.5)
RDW: 14.6 % — ABNORMAL HIGH (ref 11.2–14.5)
lymph#: 0.6 10*3/uL — ABNORMAL LOW (ref 0.9–3.3)

## 2013-03-04 NOTE — Telephone Encounter (Signed)
Patient calling in today that she is becoming more fatigued and does not feel she can wait past the weekend till Monday for blood. Spoke with Zollie Scale, PA, have patient to come in for labs today for potential transfusion, will get appt for transfusion on Friday for patient.

## 2013-03-05 ENCOUNTER — Other Ambulatory Visit: Payer: Self-pay | Admitting: Cardiovascular Disease

## 2013-03-06 ENCOUNTER — Ambulatory Visit (HOSPITAL_BASED_OUTPATIENT_CLINIC_OR_DEPARTMENT_OTHER): Payer: Medicare Other

## 2013-03-06 ENCOUNTER — Other Ambulatory Visit: Payer: Self-pay | Admitting: Oncology

## 2013-03-06 VITALS — BP 126/48 | HR 80 | Temp 98.2°F | Resp 18

## 2013-03-06 DIAGNOSIS — D649 Anemia, unspecified: Secondary | ICD-10-CM

## 2013-03-06 DIAGNOSIS — D630 Anemia in neoplastic disease: Secondary | ICD-10-CM

## 2013-03-06 MED ORDER — ACETAMINOPHEN 325 MG PO TABS
650.0000 mg | ORAL_TABLET | Freq: Once | ORAL | Status: AC
  Administered 2013-03-06: 650 mg via ORAL

## 2013-03-06 MED ORDER — HEPARIN SOD (PORK) LOCK FLUSH 100 UNIT/ML IV SOLN
500.0000 [IU] | Freq: Every day | INTRAVENOUS | Status: AC | PRN
  Administered 2013-03-06: 500 [IU]
  Filled 2013-03-06: qty 5

## 2013-03-06 MED ORDER — SODIUM CHLORIDE 0.9 % IJ SOLN
10.0000 mL | INTRAMUSCULAR | Status: AC | PRN
  Administered 2013-03-06: 10 mL
  Filled 2013-03-06: qty 10

## 2013-03-06 MED ORDER — SODIUM CHLORIDE 0.9 % IV SOLN
250.0000 mL | Freq: Once | INTRAVENOUS | Status: AC
  Administered 2013-03-06: 250 mL via INTRAVENOUS

## 2013-03-06 MED ORDER — DIPHENHYDRAMINE HCL 25 MG PO CAPS
25.0000 mg | ORAL_CAPSULE | Freq: Once | ORAL | Status: AC
  Administered 2013-03-06: 25 mg via ORAL

## 2013-03-06 NOTE — Patient Instructions (Signed)
Blood Transfusion Information WHAT IS A BLOOD TRANSFUSION? A transfusion is the replacement of blood or some of its parts. Blood is made up of multiple cells which provide different functions.  Red blood cells carry oxygen and are used for blood loss replacement.  White blood cells fight against infection.  Platelets control bleeding.  Plasma helps clot blood.  Other blood products are available for specialized needs, such as hemophilia or other clotting disorders. BEFORE THE TRANSFUSION  Who gives blood for transfusions?   You may be able to donate blood to be used at a later date on yourself (autologous donation).  Relatives can be asked to donate blood. This is generally not any safer than if you have received blood from a stranger. The same precautions are taken to ensure safety when a relative's blood is donated.  Healthy volunteers who are fully evaluated to make sure their blood is safe. This is blood bank blood. Transfusion therapy is the safest it has ever been in the practice of medicine. Before blood is taken from a donor, a complete history is taken to make sure that person has no history of diseases nor engages in risky social behavior (examples are intravenous drug use or sexual activity with multiple partners). The donor's travel history is screened to minimize risk of transmitting infections, such as malaria. The donated blood is tested for signs of infectious diseases, such as HIV and hepatitis. The blood is then tested to be sure it is compatible with you in order to minimize the chance of a transfusion reaction. If you or a relative donates blood, this is often done in anticipation of surgery and is not appropriate for emergency situations. It takes many days to process the donated blood. RISKS AND COMPLICATIONS Although transfusion therapy is very safe and saves many lives, the main dangers of transfusion include:   Getting an infectious disease.  Developing a  transfusion reaction. This is an allergic reaction to something in the blood you were given. Every precaution is taken to prevent this. The decision to have a blood transfusion has been considered carefully by your caregiver before blood is given. Blood is not given unless the benefits outweigh the risks. AFTER THE TRANSFUSION  Right after receiving a blood transfusion, you will usually feel much better and more energetic. This is especially true if your red blood cells have gotten low (anemic). The transfusion raises the level of the red blood cells which carry oxygen, and this usually causes an energy increase.  The nurse administering the transfusion will monitor you carefully for complications. HOME CARE INSTRUCTIONS  No special instructions are needed after a transfusion. You may find your energy is better. Speak with your caregiver about any limitations on activity for underlying diseases you may have. SEEK MEDICAL CARE IF:   Your condition is not improving after your transfusion.  You develop redness or irritation at the intravenous (IV) site. SEEK IMMEDIATE MEDICAL CARE IF:  Any of the following symptoms occur over the next 12 hours:  Shaking chills.  You have a temperature by mouth above 102 F (38.9 C), not controlled by medicine.  Chest, back, or muscle pain.  People around you feel you are not acting correctly or are confused.  Shortness of breath or difficulty breathing.  Dizziness and fainting.  You get a rash or develop hives.  You have a decrease in urine output.  Your urine turns a dark color or changes to pink, red, or brown. Any of the following   symptoms occur over the next 10 days:  You have a temperature by mouth above 102 F (38.9 C), not controlled by medicine.  Shortness of breath.  Weakness after normal activity.  The white part of the eye turns yellow (jaundice).  You have a decrease in the amount of urine or are urinating less often.  Your  urine turns a dark color or changes to pink, red, or brown. Document Released: 07/06/2000 Document Revised: 10/01/2011 Document Reviewed: 02/23/2008 ExitCare Patient Information 2014 [employer].  

## 2013-03-07 LAB — TYPE AND SCREEN
Antibody Screen: NEGATIVE
Donor AG Type: NEGATIVE
Donor AG Type: NEGATIVE
Unit division: 0
Unit division: 0

## 2013-03-09 ENCOUNTER — Other Ambulatory Visit: Payer: Self-pay | Admitting: Oncology

## 2013-03-09 ENCOUNTER — Telehealth: Payer: Self-pay | Admitting: *Deleted

## 2013-03-09 ENCOUNTER — Other Ambulatory Visit (HOSPITAL_BASED_OUTPATIENT_CLINIC_OR_DEPARTMENT_OTHER): Payer: Medicare Other

## 2013-03-09 ENCOUNTER — Telehealth: Payer: Self-pay | Admitting: Emergency Medicine

## 2013-03-09 DIAGNOSIS — C9200 Acute myeloblastic leukemia, not having achieved remission: Secondary | ICD-10-CM

## 2013-03-09 DIAGNOSIS — D649 Anemia, unspecified: Secondary | ICD-10-CM

## 2013-03-09 LAB — CBC WITH DIFFERENTIAL/PLATELET
Basophils Absolute: 0 10*3/uL (ref 0.0–0.1)
EOS%: 5.6 % (ref 0.0–7.0)
Eosinophils Absolute: 0.1 10*3/uL (ref 0.0–0.5)
HGB: 9.9 g/dL — ABNORMAL LOW (ref 11.6–15.9)
LYMPH%: 53.1 % — ABNORMAL HIGH (ref 14.0–49.7)
MCH: 29.5 pg (ref 25.1–34.0)
MCV: 85.4 fL (ref 79.5–101.0)
MONO%: 23.1 % — ABNORMAL HIGH (ref 0.0–14.0)
NEUT#: 0.2 10*3/uL — CL (ref 1.5–6.5)
NEUT%: 15.8 % — ABNORMAL LOW (ref 38.4–76.8)
Platelets: 344 10*3/uL (ref 145–400)
RDW: 15.8 % — ABNORMAL HIGH (ref 11.2–14.5)

## 2013-03-09 NOTE — Telephone Encounter (Signed)
Lm gv appts for 04-May-2013 w/ labs @12  noon and ov @ 12:30pm......td

## 2013-03-09 NOTE — Progress Notes (Signed)
[name] dropped in today to review her lab results: Which are of course much better after her transfusion of 2 units of packed red cells 03/06/2013.  She and her family met with Dr. Lowell Guitar at [facility] and the plan is going to before admission sometime this week.  I urged Kailei to go ahead and get admitted ASAP. All we are gaining with delay is further time at risk for infection and bleeding. I will be canceling her followup appointments here but making her new an approximately 6-8 weeks from now which is when I would hope she would be being discharged.

## 2013-03-11 ENCOUNTER — Encounter: Payer: Self-pay | Admitting: *Deleted

## 2013-03-13 ENCOUNTER — Ambulatory Visit: Payer: Medicare Other | Admitting: Cardiovascular Disease

## 2013-03-13 LAB — REMOTE PACEMAKER DEVICE
AL AMPLITUDE: 2.6 mv
ATRIAL PACING PM: 4
BAMS-0001: 150 {beats}/min
BRDY-0002RV: 60 {beats}/min
BRDY-0003RV: 130 {beats}/min
DEVICE MODEL PM: 111389
RV LEAD THRESHOLD: 1 V

## 2013-03-16 ENCOUNTER — Other Ambulatory Visit: Payer: Medicare Other | Admitting: Lab

## 2013-03-24 ENCOUNTER — Other Ambulatory Visit: Payer: Medicare Other | Admitting: Lab

## 2013-03-30 ENCOUNTER — Other Ambulatory Visit: Payer: Medicare Other | Admitting: Lab

## 2013-04-06 ENCOUNTER — Other Ambulatory Visit: Payer: Medicare Other | Admitting: Lab

## 2013-04-13 ENCOUNTER — Other Ambulatory Visit: Payer: Medicare Other | Admitting: Lab

## 2013-04-20 ENCOUNTER — Other Ambulatory Visit: Payer: Medicare Other | Admitting: Lab

## 2013-04-27 ENCOUNTER — Other Ambulatory Visit: Payer: Medicare Other | Admitting: Lab

## 2013-04-27 ENCOUNTER — Encounter: Payer: Medicare Other | Admitting: Oncology

## 2013-05-13 IMAGING — CT CT BIOPSY
1 series · 1 of 14 positions shown · non-contrast
Comparison: none

Clinical Data/Indication: LEUKEMIA

CT-GUIDED BONE MARROW ASPIRATE AND CORE BIOPSY.
Sedation: Versed one mg, Fentanyl 100 mcg.
Total Moderate Sedation Time: 15 minutes.
Procedure: The procedure, risks, benefits, and alternatives were
explained to the patient. Questions regarding the procedure were
encouraged and answered. The patient understands and consents to
the procedure.
The back was prepped with betadine in a sterile fashion, and a
sterile drape was applied covering the operative field. A sterile
gown and sterile gloves were used for the procedure.
Under CT guidance, an 11 gauge needle was inserted into the left
iliac bone via posterior approach.  Aspirates were obtained.  Core
was obtained. Final imaging was performed.
Patient tolerated the procedure well without complication.  Vital
sign monitoring by nursing staff during the procedure will continue
as patient is in the special procedures unit for post procedure
observation.

[Series 2: localizer · axial · 5.0mm · 0.63mm/px · 1 of 14 slices shown]
[im 8/14]
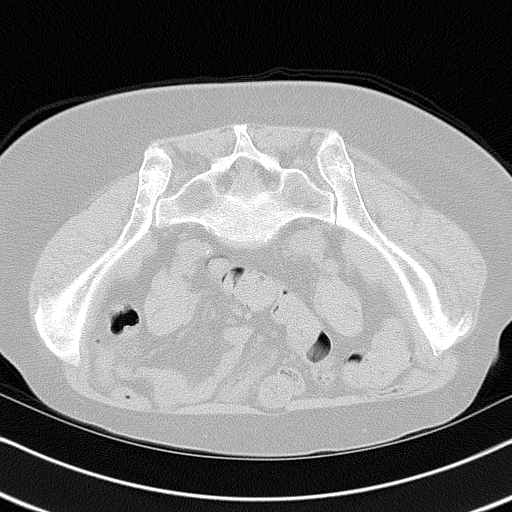

[1 of 14 positions shown; findings below may reference images not displayed]

FINDINGS: The images document guide needle placement within the
left iliac bone via posterior approach.  Post biopsy images
demonstrate no hemorrhage.
IMPRESSION: Successful CT-guided bone marrow aspirate and core.

## 2013-05-14 ENCOUNTER — Encounter: Payer: Self-pay | Admitting: Cardiovascular Disease

## 2013-05-14 NOTE — Telephone Encounter (Signed)
No entry 

## 2013-05-23 NOTE — Progress Notes (Signed)
This encounter was created in error - please disregard.

## 2013-05-23 DEATH — deceased

## 2013-07-17 ENCOUNTER — Encounter: Payer: Self-pay | Admitting: *Deleted

## 2013-10-26 ENCOUNTER — Telehealth: Payer: Self-pay | Admitting: Cardiovascular Disease

## 2013-11-12 NOTE — Telephone Encounter (Signed)
Closed enounter °

## 2013-11-30 ENCOUNTER — Encounter: Payer: Medicare Other | Admitting: Cardiovascular Disease

## 2014-02-16 ENCOUNTER — Telehealth: Payer: Self-pay

## 2014-02-16 NOTE — Telephone Encounter (Signed)
Patient died @ [facility] per Iver Nestle

## 2014-07-01 ENCOUNTER — Encounter (HOSPITAL_COMMUNITY): Payer: Self-pay | Admitting: Cardiovascular Disease
